# Patient Record
Sex: Female | Born: 1960 | Race: White | Hispanic: No | Smoking: Never smoker
Health system: Southern US, Community
[De-identification: ages and names within clinical notes are randomized; demographics above are authoritative.]

## PROBLEM LIST (undated history)

## (undated) DIAGNOSIS — R351 Nocturia: Secondary | ICD-10-CM

## (undated) DIAGNOSIS — R112 Nausea with vomiting, unspecified: Secondary | ICD-10-CM

## (undated) DIAGNOSIS — K219 Gastro-esophageal reflux disease without esophagitis: Secondary | ICD-10-CM

## (undated) DIAGNOSIS — K649 Unspecified hemorrhoids: Secondary | ICD-10-CM

## (undated) DIAGNOSIS — M8580 Other specified disorders of bone density and structure, unspecified site: Secondary | ICD-10-CM

## (undated) DIAGNOSIS — I10 Essential (primary) hypertension: Secondary | ICD-10-CM

## (undated) DIAGNOSIS — N809 Endometriosis, unspecified: Secondary | ICD-10-CM

## (undated) DIAGNOSIS — F419 Anxiety disorder, unspecified: Secondary | ICD-10-CM

## (undated) DIAGNOSIS — N393 Stress incontinence (female) (male): Secondary | ICD-10-CM

## (undated) DIAGNOSIS — Z9889 Other specified postprocedural states: Secondary | ICD-10-CM

## (undated) DIAGNOSIS — F329 Major depressive disorder, single episode, unspecified: Secondary | ICD-10-CM

## (undated) DIAGNOSIS — F32A Depression, unspecified: Secondary | ICD-10-CM

## (undated) HISTORY — DX: Nocturia: R35.1

## (undated) HISTORY — PX: FOOT SURGERY: SHX648

## (undated) HISTORY — DX: Endometriosis, unspecified: N80.9

## (undated) HISTORY — PX: DENTAL SURGERY: SHX609

## (undated) HISTORY — DX: Essential (primary) hypertension: I10

## (undated) HISTORY — DX: Unspecified hemorrhoids: K64.9

## (undated) HISTORY — DX: Gastro-esophageal reflux disease without esophagitis: K21.9

## (undated) HISTORY — PX: OTHER SURGICAL HISTORY: SHX169

## (undated) HISTORY — DX: Depression, unspecified: F32.A

## (undated) HISTORY — DX: Other specified disorders of bone density and structure, unspecified site: M85.80

## (undated) HISTORY — DX: Stress incontinence (female) (male): N39.3

## (undated) HISTORY — PX: OOPHORECTOMY: SHX86

## (undated) HISTORY — DX: Anxiety disorder, unspecified: F41.9

## (undated) HISTORY — DX: Major depressive disorder, single episode, unspecified: F32.9

---

## 1987-07-26 HISTORY — PX: TUBAL LIGATION: SHX77

## 1991-07-26 HISTORY — PX: TOTAL ABDOMINAL HYSTERECTOMY: SHX209

## 1996-07-25 HISTORY — PX: OTHER SURGICAL HISTORY: SHX169

## 1997-04-10 HISTORY — PX: LAPAROSCOPY: SHX197

## 2004-05-25 ENCOUNTER — Ambulatory Visit: Payer: Self-pay | Admitting: Obstetrics & Gynecology

## 2005-10-12 ENCOUNTER — Ambulatory Visit: Payer: Self-pay

## 2009-06-10 ENCOUNTER — Emergency Department: Payer: Self-pay | Admitting: Emergency Medicine

## 2009-10-23 DIAGNOSIS — M8580 Other specified disorders of bone density and structure, unspecified site: Secondary | ICD-10-CM

## 2009-10-23 HISTORY — DX: Other specified disorders of bone density and structure, unspecified site: M85.80

## 2009-10-27 ENCOUNTER — Emergency Department: Payer: Self-pay | Admitting: Emergency Medicine

## 2009-11-04 ENCOUNTER — Ambulatory Visit: Payer: Self-pay

## 2010-07-13 ENCOUNTER — Inpatient Hospital Stay: Payer: Self-pay | Admitting: Psychiatry

## 2010-07-25 HISTORY — PX: COLONOSCOPY: SHX5424

## 2010-08-13 ENCOUNTER — Inpatient Hospital Stay: Payer: Self-pay | Admitting: Psychiatry

## 2010-09-02 ENCOUNTER — Emergency Department: Payer: Self-pay | Admitting: Emergency Medicine

## 2010-09-03 ENCOUNTER — Ambulatory Visit: Payer: Self-pay | Admitting: Orthopedic Surgery

## 2010-12-28 ENCOUNTER — Inpatient Hospital Stay: Payer: Self-pay | Admitting: Psychiatry

## 2011-08-01 ENCOUNTER — Ambulatory Visit: Payer: Self-pay | Admitting: Family Medicine

## 2011-10-05 ENCOUNTER — Ambulatory Visit: Payer: Self-pay

## 2011-11-23 HISTORY — PX: CYSTOSCOPY: SUR368

## 2012-02-17 ENCOUNTER — Encounter: Payer: Self-pay | Admitting: Urology

## 2012-03-14 ENCOUNTER — Inpatient Hospital Stay: Payer: Self-pay | Admitting: Psychiatry

## 2012-03-15 LAB — URINALYSIS, COMPLETE
Bacteria: NONE SEEN
Glucose,UR: NEGATIVE mg/dL (ref 0–75)
Nitrite: NEGATIVE
Protein: NEGATIVE
RBC,UR: <1 /HPF (ref 0–5)
WBC UR: 1 /HPF (ref 0–5)

## 2013-01-03 DIAGNOSIS — R635 Abnormal weight gain: Secondary | ICD-10-CM | POA: Diagnosis not present

## 2013-01-03 DIAGNOSIS — E669 Obesity, unspecified: Secondary | ICD-10-CM | POA: Diagnosis not present

## 2013-01-04 DIAGNOSIS — I10 Essential (primary) hypertension: Secondary | ICD-10-CM | POA: Diagnosis not present

## 2013-01-04 DIAGNOSIS — R635 Abnormal weight gain: Secondary | ICD-10-CM | POA: Diagnosis not present

## 2013-03-14 DIAGNOSIS — H40009 Preglaucoma, unspecified, unspecified eye: Secondary | ICD-10-CM | POA: Diagnosis not present

## 2013-03-14 DIAGNOSIS — H524 Presbyopia: Secondary | ICD-10-CM | POA: Diagnosis not present

## 2013-03-14 DIAGNOSIS — H5200 Hypermetropia, unspecified eye: Secondary | ICD-10-CM | POA: Diagnosis not present

## 2013-03-14 DIAGNOSIS — H52229 Regular astigmatism, unspecified eye: Secondary | ICD-10-CM | POA: Diagnosis not present

## 2013-04-10 DIAGNOSIS — E669 Obesity, unspecified: Secondary | ICD-10-CM | POA: Diagnosis not present

## 2013-04-10 DIAGNOSIS — Z23 Encounter for immunization: Secondary | ICD-10-CM | POA: Diagnosis not present

## 2013-04-10 DIAGNOSIS — I10 Essential (primary) hypertension: Secondary | ICD-10-CM | POA: Diagnosis not present

## 2013-05-18 ENCOUNTER — Emergency Department: Payer: Self-pay | Admitting: Emergency Medicine

## 2013-05-18 DIAGNOSIS — S066XAA Traumatic subarachnoid hemorrhage with loss of consciousness status unknown, initial encounter: Secondary | ICD-10-CM | POA: Diagnosis not present

## 2013-05-18 DIAGNOSIS — S3600XA Unspecified injury of spleen, initial encounter: Secondary | ICD-10-CM | POA: Diagnosis not present

## 2013-05-18 DIAGNOSIS — S32009A Unspecified fracture of unspecified lumbar vertebra, initial encounter for closed fracture: Secondary | ICD-10-CM | POA: Diagnosis not present

## 2013-05-18 DIAGNOSIS — S060X9A Concussion with loss of consciousness of unspecified duration, initial encounter: Secondary | ICD-10-CM | POA: Diagnosis not present

## 2013-05-18 DIAGNOSIS — R918 Other nonspecific abnormal finding of lung field: Secondary | ICD-10-CM | POA: Diagnosis not present

## 2013-05-18 DIAGNOSIS — S06309A Unspecified focal traumatic brain injury with loss of consciousness of unspecified duration, initial encounter: Secondary | ICD-10-CM | POA: Diagnosis not present

## 2013-05-18 DIAGNOSIS — S0230XA Fracture of orbital floor, unspecified side, initial encounter for closed fracture: Secondary | ICD-10-CM | POA: Diagnosis not present

## 2013-05-18 DIAGNOSIS — I10 Essential (primary) hypertension: Secondary | ICD-10-CM | POA: Diagnosis not present

## 2013-05-18 LAB — CBC
HCT: 39.1 % (ref 35.0–47.0)
HGB: 13.7 g/dL (ref 12.0–16.0)
MCV: 96 fL (ref 80–100)
Platelet: 266 10*3/uL (ref 150–440)
RBC: 4.06 10*6/uL (ref 3.80–5.20)
RDW: 12.6 % (ref 11.5–14.5)
WBC: 12.8 10*3/uL — ABNORMAL HIGH (ref 3.6–11.0)

## 2013-05-18 LAB — PROTIME-INR
INR: 1
Prothrombin Time: 13.6 secs (ref 11.5–14.7)

## 2013-05-18 LAB — LIPASE, BLOOD: Lipase: 128 U/L (ref 73–393)

## 2013-05-18 LAB — URINALYSIS, COMPLETE
Bilirubin,UR: NEGATIVE
Blood: NEGATIVE
Glucose,UR: NEGATIVE mg/dL (ref 0–75)
Ketone: NEGATIVE
Leukocyte Esterase: NEGATIVE
Ph: 5 (ref 4.5–8.0)
Protein: NEGATIVE
Specific Gravity: 1.004 (ref 1.003–1.030)
WBC UR: 1 /HPF (ref 0–5)

## 2013-05-18 LAB — COMPREHENSIVE METABOLIC PANEL
Albumin: 3.7 g/dL (ref 3.4–5.0)
BUN: 8 mg/dL (ref 7–18)
Bilirubin,Total: 0.2 mg/dL (ref 0.2–1.0)
Chloride: 97 mmol/L — ABNORMAL LOW (ref 98–107)
Creatinine: 0.96 mg/dL (ref 0.60–1.30)
EGFR (African American): >60
EGFR (Non-African Amer.): >60
Glucose: 101 mg/dL — ABNORMAL HIGH (ref 65–99)
SGPT (ALT): 23 U/L (ref 12–78)
Sodium: 131 mmol/L — ABNORMAL LOW (ref 136–145)

## 2013-05-18 LAB — APTT: Activated PTT: 27.6 secs (ref 23.6–35.9)

## 2013-05-18 LAB — DRUG SCREEN, URINE
Benzodiazepine, Ur Scrn: NEGATIVE (ref ?–200)
Cannabinoid 50 Ng, Ur ~~LOC~~: NEGATIVE (ref ?–50)
Cocaine Metabolite,Ur ~~LOC~~: NEGATIVE (ref ?–300)
MDMA (Ecstasy)Ur Screen: NEGATIVE (ref ?–500)
Methadone, Ur Screen: NEGATIVE (ref ?–300)
Opiate, Ur Screen: NEGATIVE (ref ?–300)
Phencyclidine (PCP) Ur S: NEGATIVE (ref ?–25)
Tricyclic, Ur Screen: NEGATIVE (ref ?–1000)

## 2013-05-18 LAB — TROPONIN I: Troponin-I: <0.02 ng/mL

## 2013-05-18 LAB — CALCIUM: Calcium, Total: 8.6 mg/dL (ref 8.5–10.1)

## 2013-05-19 DIAGNOSIS — R918 Other nonspecific abnormal finding of lung field: Secondary | ICD-10-CM | POA: Diagnosis not present

## 2013-05-19 DIAGNOSIS — S3600XA Unspecified injury of spleen, initial encounter: Secondary | ICD-10-CM | POA: Diagnosis not present

## 2013-05-19 DIAGNOSIS — Z043 Encounter for examination and observation following other accident: Secondary | ICD-10-CM | POA: Diagnosis not present

## 2013-05-19 DIAGNOSIS — S36039A Unspecified laceration of spleen, initial encounter: Secondary | ICD-10-CM | POA: Diagnosis not present

## 2013-05-19 DIAGNOSIS — S2249XA Multiple fractures of ribs, unspecified side, initial encounter for closed fracture: Secondary | ICD-10-CM | POA: Diagnosis present

## 2013-05-19 DIAGNOSIS — S22009A Unspecified fracture of unspecified thoracic vertebra, initial encounter for closed fracture: Secondary | ICD-10-CM | POA: Diagnosis not present

## 2013-05-19 DIAGNOSIS — S066X9A Traumatic subarachnoid hemorrhage with loss of consciousness of unspecified duration, initial encounter: Secondary | ICD-10-CM | POA: Diagnosis not present

## 2013-05-19 DIAGNOSIS — S0280XA Fracture of other specified skull and facial bones, unspecified side, initial encounter for closed fracture: Secondary | ICD-10-CM | POA: Diagnosis not present

## 2013-05-19 DIAGNOSIS — S3609XA Other injury of spleen, initial encounter: Secondary | ICD-10-CM | POA: Diagnosis not present

## 2013-05-19 DIAGNOSIS — S06330A Contusion and laceration of cerebrum, unspecified, without loss of consciousness, initial encounter: Secondary | ICD-10-CM | POA: Diagnosis not present

## 2013-05-19 DIAGNOSIS — S32009A Unspecified fracture of unspecified lumbar vertebra, initial encounter for closed fracture: Secondary | ICD-10-CM | POA: Diagnosis not present

## 2013-05-19 DIAGNOSIS — S0230XA Fracture of orbital floor, unspecified side, initial encounter for closed fracture: Secondary | ICD-10-CM | POA: Diagnosis not present

## 2013-05-19 DIAGNOSIS — R3589 Other polyuria: Secondary | ICD-10-CM | POA: Diagnosis not present

## 2013-05-31 DIAGNOSIS — S0292XS Unspecified fracture of facial bones, sequela: Secondary | ICD-10-CM | POA: Diagnosis not present

## 2013-05-31 DIAGNOSIS — S0291XS Unspecified fracture of skull, sequela: Secondary | ICD-10-CM | POA: Diagnosis not present

## 2013-06-14 DIAGNOSIS — S32009A Unspecified fracture of unspecified lumbar vertebra, initial encounter for closed fracture: Secondary | ICD-10-CM | POA: Insufficient documentation

## 2013-06-14 DIAGNOSIS — IMO0002 Reserved for concepts with insufficient information to code with codable children: Secondary | ICD-10-CM | POA: Diagnosis not present

## 2013-06-14 DIAGNOSIS — M549 Dorsalgia, unspecified: Secondary | ICD-10-CM | POA: Diagnosis not present

## 2013-07-08 DIAGNOSIS — I10 Essential (primary) hypertension: Secondary | ICD-10-CM | POA: Diagnosis not present

## 2013-07-08 DIAGNOSIS — M25529 Pain in unspecified elbow: Secondary | ICD-10-CM | POA: Diagnosis not present

## 2013-07-12 DIAGNOSIS — IMO0002 Reserved for concepts with insufficient information to code with codable children: Secondary | ICD-10-CM | POA: Diagnosis not present

## 2013-07-12 DIAGNOSIS — Q762 Congenital spondylolisthesis: Secondary | ICD-10-CM | POA: Diagnosis not present

## 2013-07-12 DIAGNOSIS — M5137 Other intervertebral disc degeneration, lumbosacral region: Secondary | ICD-10-CM | POA: Diagnosis not present

## 2013-07-12 DIAGNOSIS — M549 Dorsalgia, unspecified: Secondary | ICD-10-CM | POA: Diagnosis not present

## 2013-08-19 DIAGNOSIS — M549 Dorsalgia, unspecified: Secondary | ICD-10-CM | POA: Diagnosis not present

## 2013-08-19 DIAGNOSIS — S32009A Unspecified fracture of unspecified lumbar vertebra, initial encounter for closed fracture: Secondary | ICD-10-CM | POA: Diagnosis not present

## 2013-08-22 DIAGNOSIS — S32009A Unspecified fracture of unspecified lumbar vertebra, initial encounter for closed fracture: Secondary | ICD-10-CM | POA: Diagnosis not present

## 2013-08-22 DIAGNOSIS — M549 Dorsalgia, unspecified: Secondary | ICD-10-CM | POA: Diagnosis not present

## 2013-08-25 DIAGNOSIS — M549 Dorsalgia, unspecified: Secondary | ICD-10-CM | POA: Diagnosis not present

## 2013-08-25 DIAGNOSIS — S32009A Unspecified fracture of unspecified lumbar vertebra, initial encounter for closed fracture: Secondary | ICD-10-CM | POA: Diagnosis not present

## 2013-10-16 DIAGNOSIS — F411 Generalized anxiety disorder: Secondary | ICD-10-CM | POA: Diagnosis not present

## 2013-10-16 DIAGNOSIS — F314 Bipolar disorder, current episode depressed, severe, without psychotic features: Secondary | ICD-10-CM | POA: Diagnosis not present

## 2014-05-26 DIAGNOSIS — J019 Acute sinusitis, unspecified: Secondary | ICD-10-CM | POA: Diagnosis not present

## 2014-05-26 DIAGNOSIS — I10 Essential (primary) hypertension: Secondary | ICD-10-CM | POA: Diagnosis not present

## 2014-05-26 DIAGNOSIS — Z23 Encounter for immunization: Secondary | ICD-10-CM | POA: Diagnosis not present

## 2014-05-28 DIAGNOSIS — F410 Panic disorder [episodic paroxysmal anxiety] without agoraphobia: Secondary | ICD-10-CM | POA: Diagnosis not present

## 2014-05-28 DIAGNOSIS — F411 Generalized anxiety disorder: Secondary | ICD-10-CM | POA: Diagnosis not present

## 2014-05-28 DIAGNOSIS — F332 Major depressive disorder, recurrent severe without psychotic features: Secondary | ICD-10-CM | POA: Diagnosis not present

## 2014-05-28 DIAGNOSIS — F42 Obsessive-compulsive disorder: Secondary | ICD-10-CM | POA: Diagnosis not present
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
## 2014-10-29 ENCOUNTER — Emergency Department
Admit: 2014-10-29 | Disposition: A | Discharge status: Home / Self Care | Payer: Self-pay | Admitting: Emergency Medicine

## 2014-10-29 DIAGNOSIS — R1110 Vomiting, unspecified: Secondary | ICD-10-CM | POA: Diagnosis not present

## 2014-10-29 DIAGNOSIS — R112 Nausea with vomiting, unspecified: Secondary | ICD-10-CM | POA: Diagnosis not present

## 2014-10-29 DIAGNOSIS — Z79899 Other long term (current) drug therapy: Secondary | ICD-10-CM | POA: Diagnosis not present

## 2014-10-29 DIAGNOSIS — R109 Unspecified abdominal pain: Secondary | ICD-10-CM | POA: Diagnosis not present

## 2014-10-29 DIAGNOSIS — R197 Diarrhea, unspecified: Secondary | ICD-10-CM | POA: Diagnosis not present

## 2014-10-29 LAB — COMPREHENSIVE METABOLIC PANEL
ALBUMIN: 3.8 g/dL
ALT: 14 U/L
AST: 19 U/L
Alkaline Phosphatase: 62 U/L
Anion Gap: 14 (ref 7–16)
BILIRUBIN TOTAL: 0.9 mg/dL
BUN: 17 mg/dL
CREATININE: 1.45 mg/dL — AB
Calcium, Total: 9.1 mg/dL
Chloride: 92 mmol/L — ABNORMAL LOW
Co2: 29 mmol/L
EGFR (African American): 47 — ABNORMAL LOW
EGFR (Non-African Amer.): 41 — ABNORMAL LOW
GLUCOSE: 129 mg/dL — AB
POTASSIUM: 2.7 mmol/L — AB
SODIUM: 135 mmol/L
TOTAL PROTEIN: 7.7 g/dL

## 2014-10-29 LAB — CBC WITH DIFFERENTIAL/PLATELET
Basophil #: 0 10*3/uL (ref 0.0–0.1)
Basophil %: 0.3 %
EOS PCT: 0.1 %
Eosinophil #: 0 10*3/uL (ref 0.0–0.7)
HCT: 39.7 % (ref 35.0–47.0)
HGB: 13.5 g/dL (ref 12.0–16.0)
Lymphocyte #: 0.8 10*3/uL — ABNORMAL LOW (ref 1.0–3.6)
Lymphocyte %: 7.4 %
MCH: 34.9 pg — ABNORMAL HIGH (ref 26.0–34.0)
MCHC: 33.9 g/dL (ref 32.0–36.0)
MCV: 103 fL — AB (ref 80–100)
MONOS PCT: 6.8 %
Monocyte #: 0.7 x10 3/mm (ref 0.2–0.9)
NEUTROS PCT: 85.4 %
Neutrophil #: 9 10*3/uL — ABNORMAL HIGH (ref 1.4–6.5)
PLATELETS: 187 10*3/uL (ref 150–440)
RBC: 3.87 10*6/uL (ref 3.80–5.20)
RDW: 12.1 % (ref 11.5–14.5)
WBC: 10.6 10*3/uL (ref 3.6–11.0)

## 2014-10-29 LAB — LIPASE, BLOOD: LIPASE: 49 U/L
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
## 2014-12-10 DIAGNOSIS — J188 Other pneumonia, unspecified organism: Secondary | ICD-10-CM | POA: Diagnosis not present

## 2015-01-06 ENCOUNTER — Other Ambulatory Visit: Payer: Self-pay | Admitting: Family Medicine

## 2015-01-06 DIAGNOSIS — I10 Essential (primary) hypertension: Secondary | ICD-10-CM

## 2015-01-21 ENCOUNTER — Ambulatory Visit (INDEPENDENT_AMBULATORY_CARE_PROVIDER_SITE_OTHER): Payer: Medicare Other | Admitting: Family Medicine

## 2015-01-21 ENCOUNTER — Encounter: Payer: Self-pay | Admitting: Family Medicine

## 2015-01-21 VITALS — BP 130/80 | HR 80 | Ht 61.0 in | Wt 165.0 lb

## 2015-01-21 DIAGNOSIS — F32A Depression, unspecified: Secondary | ICD-10-CM | POA: Insufficient documentation

## 2015-01-21 DIAGNOSIS — M1711 Unilateral primary osteoarthritis, right knee: Secondary | ICD-10-CM

## 2015-01-21 DIAGNOSIS — I10 Essential (primary) hypertension: Secondary | ICD-10-CM | POA: Diagnosis not present

## 2015-01-21 DIAGNOSIS — F418 Other specified anxiety disorders: Secondary | ICD-10-CM

## 2015-01-21 DIAGNOSIS — F419 Anxiety disorder, unspecified: Secondary | ICD-10-CM | POA: Insufficient documentation

## 2015-01-21 DIAGNOSIS — F329 Major depressive disorder, single episode, unspecified: Secondary | ICD-10-CM

## 2015-01-21 MED ORDER — LOSARTAN POTASSIUM-HCTZ 100-25 MG PO TABS: 1.0000 | ORAL_TABLET | Freq: Every day | ORAL | Status: DC

## 2015-01-21 MED ORDER — VORTIOXETINE HBR 10 MG PO TABS: 1.0000 | ORAL_TABLET | Freq: Every day | ORAL | Status: DC

## 2015-01-21 NOTE — Progress Notes (Signed)
Name: Barbara Durham   MRN: [id number]    DOB: 07/12/1961   Date:01/21/2015       Progress Note  Subjective  Chief Complaint  Chief Complaint  Patient presents with  . Hypertension  . Knee Pain    Hypertension This is a recurrent problem. The current episode started more than 1 year ago. The problem has been gradually improving since onset. The problem is controlled. Associated symptoms include anxiety. Pertinent negatives include no blurred vision, chest pain, headaches, malaise/fatigue, neck pain, orthopnea, palpitations, peripheral edema, PND, shortness of breath or sweats. There are no associated agents to hypertension. Risk factors for coronary artery disease include stress. Past treatments include angiotensin blockers and diuretics. The current treatment provides moderate improvement. There are no compliance problems.  There is no history of angina, kidney disease, CAD/MI, CVA, heart failure, left ventricular hypertrophy, PVD or retinopathy. There is no history of chronic renal disease.  Knee Pain  There was no injury mechanism. The pain is present in the right knee. The pain is at a severity of 7/10. The pain is moderate. The pain has been worsening since onset. Associated symptoms include a loss of motion. Pertinent negatives include no inability to bear weight or tingling. She has tried NSAIDs for the symptoms. The treatment provided no relief.  Mental Health Problem The primary symptoms include dysphoric mood. The primary symptoms do not include delusions, hallucinations, bizarre behavior, disorganized speech, negative symptoms or somatic symptoms. This is a chronic problem.  The degree of incapacity that she is experiencing as a consequence of her illness is moderate. Additional symptoms of the illness include insomnia. Additional symptoms of the illness do not include no anhedonia, no euphoric mood, no headaches or no abdominal pain. She does not admit to suicidal ideas. She does  not have a plan to commit suicide. She does not contemplate harming herself. She does not contemplate injuring another person.  Anxiety Presents for follow-up visit. The problem has been waxing and waning. Symptoms include insomnia and nervous/anxious behavior. Patient reports no chest pain, dizziness, nausea, palpitations, shortness of breath or suicidal ideas. Symptoms occur most days. The severity of symptoms is moderate.   Past treatments include benzodiazephines and non-SSRI antidepressants.    No problem-specific assessment & plan notes found for this encounter.   Past Medical History  Diagnosis Date  . Depression   . Anxiety   . Hypertension     Past Surgical History  Procedure Laterality Date  . Vaginal hysterectomy    . Arm fracture Left     Family History  Problem Relation Age of Onset  . Heart disease Mother     History   Social History  . Marital Status: Married    Spouse Name: N/A  . Number of Children: N/A  . Years of Education: N/A   Occupational History  . Not on file.   Social History Main Topics  . Smoking status: Never Smoker   . Smokeless tobacco: Not on file  . Alcohol Use: 0.0 oz/week    0 Standard drinks or equivalent per week  . Drug Use: No  . Sexual Activity: Yes   Other Topics Concern  . Not on file   Social History Narrative  . No narrative on file    No Known Allergies   Review of Systems  Constitutional: Negative for fever, chills, weight loss and malaise/fatigue.  HENT: Negative for ear discharge, ear pain and sore throat.   Eyes: Negative for blurred vision.  Respiratory: Negative for cough, sputum production, shortness of breath and wheezing.   Cardiovascular: Negative for chest pain, palpitations, orthopnea, leg swelling and PND.  Gastrointestinal: Negative for heartburn, nausea, abdominal pain, diarrhea, constipation, blood in stool and melena.  Genitourinary: Negative for dysuria, urgency, frequency and hematuria.   Musculoskeletal: Negative for myalgias, back pain, joint pain and neck pain.  Skin: Negative for rash.  Neurological: Negative for dizziness, tingling, sensory change, focal weakness and headaches.  Endo/Heme/Allergies: Negative for environmental allergies and polydipsia. Does not bruise/bleed easily.  Psychiatric/Behavioral: Positive for dysphoric mood. Negative for depression, suicidal ideas and hallucinations. The patient is nervous/anxious and has insomnia.      Objective  Filed Vitals:   01/21/15 0916  BP: 130/80  Pulse: 80  Height: 5\' 1"  (1.549 m)  Weight: 165 lb (74.844 kg)    Physical Exam  Constitutional: She is well-developed, well-nourished, and in no distress. No distress.  HENT:  Head: Normocephalic and atraumatic.  Right Ear: External ear normal.  Left Ear: External ear normal.  Nose: Nose normal.  Mouth/Throat: Oropharynx is clear and moist.  Eyes: Conjunctivae and EOM are normal. Pupils are equal, round, and reactive to light. Right eye exhibits no discharge. Left eye exhibits no discharge.  Neck: Normal range of motion. Neck supple. No JVD present. No thyromegaly present.  Cardiovascular: Normal rate, regular rhythm, normal heart sounds and intact distal pulses.  Exam reveals no gallop and no friction rub.   No murmur heard. Pulmonary/Chest: Effort normal and breath sounds normal.  Abdominal: Soft. Bowel sounds are normal. She exhibits no mass. There is no tenderness. There is no guarding.  Musculoskeletal: Normal range of motion. She exhibits no edema.  Lymphadenopathy:    She has no cervical adenopathy.  Neurological: She is alert. She has normal reflexes.  Skin: Skin is warm and dry. No rash noted. She is not diaphoretic. No erythema. No pallor.  Psychiatric: Mood and affect normal.      Assessment & Plan  Problem List Items Addressed This Visit      Cardiovascular and Mediastinum   Hypertension - Primary   Relevant Medications    losartan-hydrochlorothiazide (HYZAAR) 100-25 MG per tablet     Other   Anxiety and depression   Relevant Medications   Vortioxetine HBr (BRINTELLIX) 10 MG TABS    Other Visit Diagnoses    Primary osteoarthritis of right knee        Relevant Orders    Ambulatory referral to Orthopedic Surgery         Dr. Otilio Miu [facility]  01/21/2015

## 2015-02-05 DIAGNOSIS — M25561 Pain in right knee: Secondary | ICD-10-CM | POA: Diagnosis not present

## 2015-02-05 DIAGNOSIS — M2391 Unspecified internal derangement of right knee: Secondary | ICD-10-CM | POA: Diagnosis not present

## 2015-02-17 ENCOUNTER — Ambulatory Visit: Payer: Medicare Other | Admitting: Psychiatry

## 2015-02-19 ENCOUNTER — Ambulatory Visit (INDEPENDENT_AMBULATORY_CARE_PROVIDER_SITE_OTHER): Payer: Medicare Other | Admitting: Psychiatry

## 2015-02-19 ENCOUNTER — Encounter: Payer: Self-pay | Admitting: Psychiatry

## 2015-02-19 VITALS — BP 132/88 | HR 70 | Temp 97.1°F | Ht 61.0 in | Wt 178.8 lb

## 2015-02-19 DIAGNOSIS — F419 Anxiety disorder, unspecified: Principal | ICD-10-CM

## 2015-02-19 DIAGNOSIS — F329 Major depressive disorder, single episode, unspecified: Secondary | ICD-10-CM

## 2015-02-19 DIAGNOSIS — F32A Depression, unspecified: Secondary | ICD-10-CM

## 2015-02-19 DIAGNOSIS — F418 Other specified anxiety disorders: Secondary | ICD-10-CM

## 2015-02-19 MED ORDER — VORTIOXETINE HBR 5 MG PO TABS: 5.0000 mg | ORAL_TABLET | Freq: Every morning | ORAL | Status: DC

## 2015-02-19 MED ORDER — VORTIOXETINE HBR 10 MG PO TABS: 1.0000 | ORAL_TABLET | Freq: Every day | ORAL | Status: DC

## 2015-02-19 MED ORDER — CLONAZEPAM 0.5 MG PO TABS: 1.0000 mg | ORAL_TABLET | Freq: Every day | ORAL | Status: DC

## 2015-02-19 MED ORDER — QUETIAPINE FUMARATE 100 MG PO TABS: 100.0000 mg | ORAL_TABLET | Freq: Every day | ORAL | Status: DC
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
## 2015-03-17 ENCOUNTER — Ambulatory Visit: Payer: Self-pay | Admitting: Psychiatry

## 2015-03-26 ENCOUNTER — Ambulatory Visit: Payer: Self-pay | Admitting: Psychiatry

## 2015-04-16 ENCOUNTER — Encounter: Payer: Self-pay | Admitting: Psychiatry

## 2015-04-16 ENCOUNTER — Ambulatory Visit (INDEPENDENT_AMBULATORY_CARE_PROVIDER_SITE_OTHER): Payer: Medicare Other | Admitting: Psychiatry

## 2015-04-16 VITALS — BP 128/84 | HR 74 | Temp 97.7°F | Ht 62.0 in | Wt 180.8 lb

## 2015-04-16 DIAGNOSIS — F418 Other specified anxiety disorders: Secondary | ICD-10-CM

## 2015-04-16 DIAGNOSIS — F419 Anxiety disorder, unspecified: Secondary | ICD-10-CM

## 2015-04-16 DIAGNOSIS — F331 Major depressive disorder, recurrent, moderate: Secondary | ICD-10-CM

## 2015-04-16 DIAGNOSIS — F32A Depression, unspecified: Secondary | ICD-10-CM

## 2015-04-16 DIAGNOSIS — F329 Major depressive disorder, single episode, unspecified: Secondary | ICD-10-CM

## 2015-04-16 MED ORDER — HYDROXYZINE PAMOATE 50 MG PO CAPS: 50.0000 mg | ORAL_CAPSULE | Freq: Every day | ORAL | Status: DC

## 2015-04-16 MED ORDER — VORTIOXETINE HBR 5 MG PO TABS: 5.0000 mg | ORAL_TABLET | Freq: Every morning | ORAL | Status: DC

## 2015-04-16 MED ORDER — VORTIOXETINE HBR 10 MG PO TABS: 1.0000 | ORAL_TABLET | Freq: Every day | ORAL | Status: DC

## 2015-04-16 MED ORDER — CLONAZEPAM 0.5 MG PO TABS: 0.5000 mg | ORAL_TABLET | Freq: Every day | ORAL | Status: DC
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
## 2015-04-20 ENCOUNTER — Telehealth: Payer: Self-pay

## 2015-04-20 NOTE — Telephone Encounter (Signed)
lea states that patient left a message on her voicemail that her sleep meds are not working.

## 2015-04-20 NOTE — Telephone Encounter (Signed)
i called and left a message for patient to call office back for more details.

## 2015-04-21 MED ORDER — TRAZODONE HCL 50 MG PO TABS: ORAL_TABLET | ORAL | Status: DC

## 2015-04-21 NOTE — Telephone Encounter (Signed)
Patient was seen by Dr. Gretel Acre. I'm currently covering for Dr. Gretel Acre. Spoke with patient today. She stated that she's been taking the hydroxyzine and it has been ineffective. She states she's taking 50 mg and then waited and taken another 50 mg. I asked her if she was either drowsy on this and discussed that we could potentially increase her dose of hydroxyzine. However patient reports no effect whatsoever with taking the one tablet in then taking another a few hours later. She is more interested in another medication. Risk and benefits of trazodone at been discussed and patient is able to consent. I will start trazodone 50 mg to 100 mg at bedtime as needed for insomnia. I've instructed her to stop the hydroxyzine .She has been encouraged calling questions or concerns. She will follow with Dr. Gretel Acre.

## 2015-04-21 NOTE — Telephone Encounter (Signed)
pt called back pt states that the hydroxyzine is not working she usually take around 8 and then she said that she has even taken another on 2-3 hours after that to see if that helps. pt  states that she had tried seroquel in the past but it made her legs move too much.  Pt states that she also was on ambien but she was taking 2  10mg  tablets but dr. Gretel Acre took her off it.  Pt states she really needs to try and get some sleep.

## 2015-04-28 NOTE — Telephone Encounter (Signed)
Pt started on Trazodone by Dr Jimmye Norman who was covering for me.

## 2015-05-14 ENCOUNTER — Ambulatory Visit (INDEPENDENT_AMBULATORY_CARE_PROVIDER_SITE_OTHER): Payer: Medicare Other | Admitting: Psychiatry

## 2015-05-14 ENCOUNTER — Encounter: Payer: Self-pay | Admitting: Psychiatry

## 2015-05-14 VITALS — BP 140/90 | HR 115 | Temp 97.3°F | Ht 62.0 in | Wt 181.8 lb

## 2015-05-14 DIAGNOSIS — F329 Major depressive disorder, single episode, unspecified: Secondary | ICD-10-CM

## 2015-05-14 DIAGNOSIS — F331 Major depressive disorder, recurrent, moderate: Secondary | ICD-10-CM | POA: Diagnosis not present

## 2015-05-14 DIAGNOSIS — F419 Anxiety disorder, unspecified: Secondary | ICD-10-CM

## 2015-05-14 DIAGNOSIS — F418 Other specified anxiety disorders: Secondary | ICD-10-CM | POA: Diagnosis not present

## 2015-05-14 DIAGNOSIS — F32A Depression, unspecified: Secondary | ICD-10-CM

## 2015-05-14 MED ORDER — VORTIOXETINE HBR 10 MG PO TABS: 1.0000 | ORAL_TABLET | Freq: Every day | ORAL | Status: DC

## 2015-05-14 MED ORDER — AMITRIPTYLINE HCL 50 MG PO TABS: 25.0000 mg | ORAL_TABLET | Freq: Every day | ORAL | Status: DC

## 2015-05-14 MED ORDER — VORTIOXETINE HBR 5 MG PO TABS: 5.0000 mg | ORAL_TABLET | Freq: Every morning | ORAL | Status: DC

## 2015-05-14 MED ORDER — CLONAZEPAM 0.5 MG PO TABS: 0.5000 mg | ORAL_TABLET | Freq: Every day | ORAL | Status: DC
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
## 2015-06-22 NOTE — Progress Notes (Signed)
According to the notes on  05-14-15 pt states that the medications did not work.

## 2015-06-23 ENCOUNTER — Ambulatory Visit (INDEPENDENT_AMBULATORY_CARE_PROVIDER_SITE_OTHER): Payer: Medicare Other | Admitting: Psychiatry

## 2015-06-23 ENCOUNTER — Encounter: Payer: Self-pay | Admitting: Psychiatry

## 2015-06-23 VITALS — BP 138/85 | HR 113 | Temp 97.7°F | Ht 62.0 in | Wt 190.0 lb

## 2015-06-23 DIAGNOSIS — F331 Major depressive disorder, recurrent, moderate: Secondary | ICD-10-CM

## 2015-06-23 MED ORDER — ZOLPIDEM TARTRATE 10 MG PO TABS: 10.0000 mg | ORAL_TABLET | Freq: Every evening | ORAL | Status: DC | PRN

## 2015-06-23 MED ORDER — CLONAZEPAM 0.5 MG PO TABS: 0.5000 mg | ORAL_TABLET | Freq: Every day | ORAL | Status: DC
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
## 2015-06-24 NOTE — Progress Notes (Signed)
Called [facility] spoke with Barbara Durham discontine elavil pt states it not working

## 2015-07-13 ENCOUNTER — Telehealth: Payer: Self-pay

## 2015-07-13 NOTE — Telephone Encounter (Signed)
pt has been enrolled in the [employer] patient assistance program.  pt is eligible to received free medications throught the program until 07-25-15  all approved medication will need to be fax when ready for refill rx needed to be 90 day supply and faxed to [phone].

## 2015-08-20 ENCOUNTER — Ambulatory Visit (INDEPENDENT_AMBULATORY_CARE_PROVIDER_SITE_OTHER): Payer: Medicare Other | Admitting: Psychiatry

## 2015-08-20 ENCOUNTER — Encounter: Payer: Self-pay | Admitting: Psychiatry

## 2015-08-20 VITALS — BP 138/82 | HR 103 | Temp 97.1°F | Ht 62.0 in | Wt 187.6 lb

## 2015-08-20 DIAGNOSIS — F411 Generalized anxiety disorder: Secondary | ICD-10-CM | POA: Diagnosis not present

## 2015-08-20 DIAGNOSIS — F331 Major depressive disorder, recurrent, moderate: Secondary | ICD-10-CM

## 2015-08-20 MED ORDER — CLONAZEPAM 0.5 MG PO TABS: 0.5000 mg | ORAL_TABLET | Freq: Every day | ORAL | Status: DC

## 2015-08-20 MED ORDER — ZOLPIDEM TARTRATE 5 MG PO TABS: 5.0000 mg | ORAL_TABLET | Freq: Every day | ORAL | Status: DC
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
## 2015-09-15 ENCOUNTER — Telehealth: Payer: Self-pay | Admitting: Psychiatry

## 2015-09-17 NOTE — Telephone Encounter (Signed)
Pt will continue on same dose. We did not discuss increasing the dose to 10mg . She is also taking Klonopin at bedtime.

## 2015-09-21 DIAGNOSIS — H524 Presbyopia: Secondary | ICD-10-CM | POA: Diagnosis not present

## 2015-09-21 DIAGNOSIS — Z83511 Family history of glaucoma: Secondary | ICD-10-CM | POA: Diagnosis not present

## 2015-09-21 DIAGNOSIS — H40013 Open angle with borderline findings, low risk, bilateral: Secondary | ICD-10-CM | POA: Diagnosis not present

## 2015-09-21 DIAGNOSIS — H52223 Regular astigmatism, bilateral: Secondary | ICD-10-CM | POA: Diagnosis not present

## 2015-09-21 DIAGNOSIS — H5203 Hypermetropia, bilateral: Secondary | ICD-10-CM | POA: Diagnosis not present

## 2015-09-30 ENCOUNTER — Telehealth: Payer: Self-pay

## 2015-09-30 NOTE — Telephone Encounter (Signed)
pt called states that she was on ambien 10mg  but you cut down to 5mg . pt states she has tried for about a month and she still can not stay asleep. pt wants to go back on ambien 10mg .

## 2015-10-01 NOTE — Telephone Encounter (Signed)
Ask pt to make appointment for medication refill, discussion about ambien and klonopin.

## 2015-10-10 ENCOUNTER — Other Ambulatory Visit: Payer: Self-pay | Admitting: Family Medicine

## 2015-10-15 ENCOUNTER — Other Ambulatory Visit: Payer: Self-pay

## 2015-10-15 ENCOUNTER — Encounter: Payer: Self-pay | Admitting: Psychiatry

## 2015-10-15 ENCOUNTER — Ambulatory Visit (INDEPENDENT_AMBULATORY_CARE_PROVIDER_SITE_OTHER): Payer: Medicare Other | Admitting: Psychiatry

## 2015-10-15 VITALS — BP 124/78 | HR 93 | Temp 98.1°F | Ht 62.0 in | Wt 194.0 lb

## 2015-10-15 DIAGNOSIS — F5102 Adjustment insomnia: Secondary | ICD-10-CM | POA: Diagnosis not present

## 2015-10-15 DIAGNOSIS — F331 Major depressive disorder, recurrent, moderate: Secondary | ICD-10-CM

## 2015-10-15 MED ORDER — LOSARTAN POTASSIUM-HCTZ 100-25 MG PO TABS: 1.0000 | ORAL_TABLET | Freq: Every day | ORAL | Status: DC

## 2015-10-15 MED ORDER — ZOLPIDEM TARTRATE 10 MG PO TABS: 10.0000 mg | ORAL_TABLET | Freq: Every day | ORAL | Status: DC

## 2015-10-15 MED ORDER — CLONAZEPAM 0.5 MG PO TABS: 0.5000 mg | ORAL_TABLET | Freq: Every day | ORAL | Status: DC
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
## 2015-10-19 ENCOUNTER — Encounter: Payer: Self-pay | Admitting: Family Medicine

## 2015-10-19 ENCOUNTER — Ambulatory Visit
Admission: RE | Admit: 2015-10-19 | Discharge: 2015-10-19 | Disposition: A | Discharge status: Home / Self Care | Payer: Medicare Other | Source: Ambulatory Visit | Attending: Family Medicine | Admitting: Family Medicine

## 2015-10-19 ENCOUNTER — Ambulatory Visit (INDEPENDENT_AMBULATORY_CARE_PROVIDER_SITE_OTHER): Payer: Medicare Other | Admitting: Family Medicine

## 2015-10-19 VITALS — BP 120/92 | HR 80 | Ht 62.0 in | Wt 192.0 lb

## 2015-10-19 DIAGNOSIS — M25531 Pain in right wrist: Secondary | ICD-10-CM | POA: Diagnosis not present

## 2015-10-19 DIAGNOSIS — I10 Essential (primary) hypertension: Secondary | ICD-10-CM | POA: Diagnosis not present

## 2015-10-19 MED ORDER — MELOXICAM 15 MG PO TABS: 15.0000 mg | ORAL_TABLET | Freq: Every day | ORAL | Status: DC

## 2015-10-19 MED ORDER — LOSARTAN POTASSIUM-HCTZ 100-25 MG PO TABS
1.0000 | ORAL_TABLET | Freq: Every day | ORAL | Status: DC
Start: 2015-10-19 — End: 2016-07-04
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
## 2015-10-20 LAB — RENAL FUNCTION PANEL
Albumin: 4.4 g/dL (ref 3.5–5.5)
BUN / CREAT RATIO: 25 — AB (ref 9–23)
BUN: 27 mg/dL — AB (ref 6–24)
CO2: 28 mmol/L (ref 18–29)
CREATININE: 1.08 mg/dL — AB (ref 0.57–1.00)
Calcium: 9.5 mg/dL (ref 8.7–10.2)
Chloride: 97 mmol/L (ref 96–106)
GFR, EST AFRICAN AMERICAN: 67 mL/min/{1.73_m2} (ref >59)
GFR, EST NON AFRICAN AMERICAN: 58 mL/min/{1.73_m2} — AB (ref >59)
Glucose: 88 mg/dL (ref 65–99)
Phosphorus: 3.5 mg/dL (ref 2.5–4.5)
Potassium: 4 mmol/L (ref 3.5–5.2)
SODIUM: 140 mmol/L (ref 134–144)

## 2015-12-15 ENCOUNTER — Ambulatory Visit (INDEPENDENT_AMBULATORY_CARE_PROVIDER_SITE_OTHER): Payer: Medicare Other | Admitting: Psychiatry

## 2015-12-15 ENCOUNTER — Encounter: Payer: Self-pay | Admitting: Psychiatry

## 2015-12-15 VITALS — BP 138/82 | HR 93 | Temp 97.9°F | Ht 62.0 in | Wt 195.8 lb

## 2015-12-15 DIAGNOSIS — F331 Major depressive disorder, recurrent, moderate: Secondary | ICD-10-CM

## 2015-12-15 DIAGNOSIS — F411 Generalized anxiety disorder: Secondary | ICD-10-CM

## 2015-12-15 MED ORDER — ZOLPIDEM TARTRATE 10 MG PO TABS: 10.0000 mg | ORAL_TABLET | Freq: Every day | ORAL | Status: DC

## 2015-12-15 MED ORDER — CLONAZEPAM 0.5 MG PO TABS: 0.5000 mg | ORAL_TABLET | Freq: Every day | ORAL | Status: DC
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
## 2016-02-11 ENCOUNTER — Encounter: Payer: Self-pay | Admitting: Psychiatry

## 2016-02-11 ENCOUNTER — Ambulatory Visit (INDEPENDENT_AMBULATORY_CARE_PROVIDER_SITE_OTHER): Payer: Medicare Other | Admitting: Psychiatry

## 2016-02-11 VITALS — BP 102/70 | HR 92 | Ht 62.0 in | Wt 196.6 lb

## 2016-02-11 DIAGNOSIS — F331 Major depressive disorder, recurrent, moderate: Secondary | ICD-10-CM

## 2016-02-11 MED ORDER — ZOLPIDEM TARTRATE 10 MG PO TABS: 10.0000 mg | ORAL_TABLET | Freq: Every day | ORAL | Status: DC

## 2016-02-11 MED ORDER — CLONAZEPAM 0.5 MG PO TABS: 0.5000 mg | ORAL_TABLET | Freq: Every day | ORAL | Status: DC
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
## 2016-05-13 ENCOUNTER — Encounter: Payer: Self-pay | Admitting: Psychiatry

## 2016-05-13 ENCOUNTER — Ambulatory Visit (INDEPENDENT_AMBULATORY_CARE_PROVIDER_SITE_OTHER): Payer: Medicare Other | Admitting: Psychiatry

## 2016-05-13 VITALS — BP 139/91 | HR 101 | Wt 196.6 lb

## 2016-05-13 DIAGNOSIS — F411 Generalized anxiety disorder: Secondary | ICD-10-CM | POA: Diagnosis not present

## 2016-05-13 DIAGNOSIS — F32A Depression, unspecified: Secondary | ICD-10-CM

## 2016-05-13 DIAGNOSIS — F329 Major depressive disorder, single episode, unspecified: Secondary | ICD-10-CM

## 2016-05-13 DIAGNOSIS — F418 Other specified anxiety disorders: Secondary | ICD-10-CM | POA: Diagnosis not present

## 2016-05-13 DIAGNOSIS — F331 Major depressive disorder, recurrent, moderate: Secondary | ICD-10-CM

## 2016-05-13 DIAGNOSIS — F419 Anxiety disorder, unspecified: Secondary | ICD-10-CM

## 2016-05-13 MED ORDER — VORTIOXETINE HBR 10 MG PO TABS: 1.0000 | ORAL_TABLET | Freq: Every day | ORAL | 1 refills | Status: DC

## 2016-05-13 MED ORDER — CLONAZEPAM 0.5 MG PO TABS
0.5000 mg | ORAL_TABLET | Freq: Every day | ORAL | 1 refills | Status: DC
Start: 2016-05-13 — End: 2016-07-13

## 2016-05-13 MED ORDER — ZOLPIDEM TARTRATE 10 MG PO TABS: 10.0000 mg | ORAL_TABLET | Freq: Every day | ORAL | 1 refills | Status: DC

## 2016-05-13 MED ORDER — VORTIOXETINE HBR 5 MG PO TABS: 5.0000 mg | ORAL_TABLET | Freq: Every morning | ORAL | 0 refills | Status: DC
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
## 2016-07-04 ENCOUNTER — Other Ambulatory Visit: Payer: Self-pay

## 2016-07-04 DIAGNOSIS — I10 Essential (primary) hypertension: Secondary | ICD-10-CM

## 2016-07-04 MED ORDER — LOSARTAN POTASSIUM-HCTZ 100-25 MG PO TABS: 1.0000 | ORAL_TABLET | Freq: Every day | ORAL | 0 refills | Status: DC

## 2016-07-06 ENCOUNTER — Ambulatory Visit (INDEPENDENT_AMBULATORY_CARE_PROVIDER_SITE_OTHER): Payer: Medicare Other | Admitting: Family Medicine

## 2016-07-06 ENCOUNTER — Encounter: Payer: Self-pay | Admitting: Family Medicine

## 2016-07-06 VITALS — BP 110/80 | HR 80 | Ht 66.0 in | Wt 201.0 lb

## 2016-07-06 DIAGNOSIS — Z23 Encounter for immunization: Secondary | ICD-10-CM

## 2016-07-06 DIAGNOSIS — I10 Essential (primary) hypertension: Secondary | ICD-10-CM | POA: Diagnosis not present

## 2016-07-06 MED ORDER — LOSARTAN POTASSIUM-HCTZ 100-25 MG PO TABS: 1.0000 | ORAL_TABLET | Freq: Every day | ORAL | 2 refills | Status: DC

## 2016-07-06 NOTE — Progress Notes (Signed)
Name: Barbara Durham   MRN: [id number]    DOB: Sep 15, 1960   Date:07/06/2016       Progress Note  Subjective  Chief Complaint  Chief Complaint  Patient presents with  . Hypertension    Hypertension  This is a chronic problem. The current episode started more than 1 year ago. The problem has been gradually improving since onset. The problem is controlled. Pertinent negatives include no anxiety, blurred vision, chest pain, headaches, malaise/fatigue, neck pain, orthopnea, palpitations, peripheral edema, PND, shortness of breath or sweats. There are no associated agents to hypertension. There are no known risk factors for coronary artery disease. Past treatments include angiotensin blockers and diuretics. The current treatment provides moderate improvement. There are no compliance problems.  There is no history of angina, kidney disease, CAD/MI, heart failure, left ventricular hypertrophy, PVD, renovascular disease or retinopathy. There is no history of chronic renal disease or a hypertension causing med.    No problem-specific Assessment & Plan notes found for this encounter.   Past Medical History:  Diagnosis Date  . Anxiety   . Depression   . Hypertension     Past Surgical History:  Procedure Laterality Date  . arm fracture Left   . COLONOSCOPY  2012   normal  . DENTAL SURGERY    . FOOT SURGERY Bilateral   . VAGINAL HYSTERECTOMY      Family History  Problem Relation Age of Onset  . Heart disease Mother   . Hypertension Mother   . Dementia Mother   . Depression Mother   . Alzheimer's disease Father   . Stroke Father   . Heart disease Sister   . Arthritis Brother   . Depression Brother   . Depression Brother   . Gout Brother   . Hypertension Brother     Social History   Social History  . Marital status: Married    Spouse name: N/A  . Number of children: N/A  . Years of education: N/A   Occupational History  . Not on file.   Social History Main Topics   . Smoking status: Never Smoker  . Smokeless tobacco: Never Used  . Alcohol use No     Comment: social  . Drug use: No  . Sexual activity: Not Currently    Birth control/ protection: None   Other Topics Concern  . Not on file   Social History Narrative  . No narrative on file    No Known Allergies   Review of Systems  Constitutional: Negative for chills, fever, malaise/fatigue and weight loss.  HENT: Negative for ear discharge, ear pain and sore throat.   Eyes: Negative for blurred vision.  Respiratory: Negative for cough, sputum production, shortness of breath and wheezing.   Cardiovascular: Negative for chest pain, palpitations, orthopnea, leg swelling and PND.  Gastrointestinal: Negative for abdominal pain, blood in stool, constipation, diarrhea, heartburn, melena and nausea.  Genitourinary: Negative for dysuria, frequency, hematuria and urgency.  Musculoskeletal: Negative for back pain, joint pain, myalgias and neck pain.  Skin: Negative for rash.  Neurological: Negative for dizziness, tingling, sensory change, focal weakness and headaches.  Endo/Heme/Allergies: Negative for environmental allergies and polydipsia. Does not bruise/bleed easily.  Psychiatric/Behavioral: Negative for depression and suicidal ideas. The patient is not nervous/anxious and does not have insomnia.      Objective  Vitals:   07/06/16 1036  BP: 110/80  Pulse: 80  Weight: 201 lb (91.2 kg)  Height: 5\' 6"  (1.676 m)  Physical Exam  Constitutional: She is well-developed, well-nourished, and in no distress. No distress.  HENT:  Head: Normocephalic and atraumatic.  Right Ear: External ear normal.  Left Ear: External ear normal.  Nose: Nose normal.  Mouth/Throat: Oropharynx is clear and moist.  Eyes: Conjunctivae and EOM are normal. Pupils are equal, round, and reactive to light. Right eye exhibits no discharge. Left eye exhibits no discharge.  Neck: Normal range of motion. Neck supple. No  JVD present. No thyromegaly present.  Cardiovascular: Normal rate, regular rhythm, normal heart sounds and intact distal pulses.  Exam reveals no gallop and no friction rub.   No murmur heard. Pulmonary/Chest: Effort normal and breath sounds normal. She has no wheezes. She has no rales.  Abdominal: Soft. Bowel sounds are normal. She exhibits no mass. There is no tenderness. There is no guarding.  Musculoskeletal: Normal range of motion. She exhibits no edema.  Lymphadenopathy:    She has no cervical adenopathy.  Neurological: She is alert. She has normal reflexes.  Skin: Skin is warm and dry. She is not diaphoretic.  Psychiatric: Mood and affect normal.  Nursing note and vitals reviewed.     Assessment & Plan  Problem List Items Addressed This Visit      Cardiovascular and Mediastinum   Hypertension   Relevant Medications   losartan-hydrochlorothiazide (HYZAAR) 100-25 MG tablet   Other Relevant Orders   Renal function panel    Other Visit Diagnoses    Immunization due    -  Primary   Relevant Orders   Flu Vaccine QUAD 36+ mos PF IM (Fluarix & Fluzone Quad PF) (Completed)   Tdap vaccine greater than or equal to 7yo IM (Completed)        Dr. [facility]  07/06/16

## 2016-07-11 ENCOUNTER — Other Ambulatory Visit: Payer: Self-pay | Admitting: Psychiatry

## 2016-07-13 ENCOUNTER — Encounter: Payer: Self-pay | Admitting: Psychiatry

## 2016-07-13 ENCOUNTER — Ambulatory Visit (INDEPENDENT_AMBULATORY_CARE_PROVIDER_SITE_OTHER): Payer: Medicare Other | Admitting: Psychiatry

## 2016-07-13 VITALS — BP 122/85 | HR 81 | Temp 97.5°F | Wt 199.4 lb

## 2016-07-13 DIAGNOSIS — F331 Major depressive disorder, recurrent, moderate: Secondary | ICD-10-CM

## 2016-07-13 DIAGNOSIS — F411 Generalized anxiety disorder: Secondary | ICD-10-CM

## 2016-07-13 MED ORDER — CLONAZEPAM 0.5 MG PO TABS: 0.5000 mg | ORAL_TABLET | Freq: Every day | ORAL | 1 refills | Status: DC

## 2016-07-13 MED ORDER — VORTIOXETINE HBR 20 MG PO TABS: 20.0000 mg | ORAL_TABLET | Freq: Every day | ORAL | 0 refills | Status: DC

## 2016-07-13 MED ORDER — ZOLPIDEM TARTRATE 10 MG PO TABS: 10.0000 mg | ORAL_TABLET | Freq: Every day | ORAL | 1 refills | Status: DC
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
## 2016-08-16 ENCOUNTER — Telehealth: Payer: Self-pay

## 2016-08-16 NOTE — Telephone Encounter (Signed)
pt is erolled in the [facility] [employer] will received free medication throught the program until 07-24-17.  medication need to be 90 day supply and faxed to [phone]   phone # [phone].  [place] [address]

## 2016-08-17 ENCOUNTER — Other Ambulatory Visit: Payer: Self-pay | Admitting: Psychiatry

## 2016-09-12 ENCOUNTER — Other Ambulatory Visit: Payer: Self-pay | Admitting: Psychiatry

## 2016-09-13 NOTE — Telephone Encounter (Signed)
Called in rx ok for klonopin and ambien for #30 no additional refills.

## 2016-09-13 NOTE — Telephone Encounter (Signed)
pt called states she will not have enough medication since appt had to be moved because dr. Gretel Acre out

## 2016-09-14 ENCOUNTER — Ambulatory Visit: Payer: Medicare Other | Admitting: Psychiatry

## 2016-10-12 ENCOUNTER — Ambulatory Visit (INDEPENDENT_AMBULATORY_CARE_PROVIDER_SITE_OTHER): Payer: Medicare Other | Admitting: Psychiatry

## 2016-10-12 ENCOUNTER — Encounter: Payer: Self-pay | Admitting: Psychiatry

## 2016-10-12 VITALS — BP 130/86 | HR 89 | Temp 98.0°F | Wt 203.2 lb

## 2016-10-12 DIAGNOSIS — F411 Generalized anxiety disorder: Secondary | ICD-10-CM

## 2016-10-12 DIAGNOSIS — F331 Major depressive disorder, recurrent, moderate: Secondary | ICD-10-CM | POA: Diagnosis not present

## 2016-10-12 MED ORDER — ZOLPIDEM TARTRATE 10 MG PO TABS: 10.0000 mg | ORAL_TABLET | Freq: Every day | ORAL | 1 refills | Status: DC

## 2016-10-12 MED ORDER — CLONAZEPAM 0.5 MG PO TABS
0.5000 mg | ORAL_TABLET | Freq: Every day | ORAL | 1 refills | Status: DC
Start: 2016-10-12 — End: 2016-12-12
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
## 2016-12-12 ENCOUNTER — Encounter: Payer: Self-pay | Admitting: Psychiatry

## 2016-12-12 ENCOUNTER — Ambulatory Visit (INDEPENDENT_AMBULATORY_CARE_PROVIDER_SITE_OTHER): Payer: Medicare Other | Admitting: Psychiatry

## 2016-12-12 VITALS — BP 122/80 | HR 99 | Temp 97.5°F | Wt 203.0 lb

## 2016-12-12 DIAGNOSIS — F411 Generalized anxiety disorder: Secondary | ICD-10-CM

## 2016-12-12 DIAGNOSIS — F331 Major depressive disorder, recurrent, moderate: Secondary | ICD-10-CM

## 2016-12-12 MED ORDER — CLONAZEPAM 0.5 MG PO TABS: 0.5000 mg | ORAL_TABLET | Freq: Every day | ORAL | 0 refills | Status: DC

## 2016-12-12 MED ORDER — ZOLPIDEM TARTRATE 10 MG PO TABS: 10.0000 mg | ORAL_TABLET | Freq: Every day | ORAL | 1 refills | Status: DC
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
## 2017-02-06 ENCOUNTER — Encounter: Payer: Self-pay | Admitting: Psychiatry

## 2017-02-06 ENCOUNTER — Ambulatory Visit (INDEPENDENT_AMBULATORY_CARE_PROVIDER_SITE_OTHER): Payer: Medicare Other | Admitting: Psychiatry

## 2017-02-06 VITALS — BP 133/85 | HR 75 | Ht 61.25 in | Wt 203.0 lb

## 2017-02-06 DIAGNOSIS — F331 Major depressive disorder, recurrent, moderate: Secondary | ICD-10-CM

## 2017-02-06 DIAGNOSIS — F411 Generalized anxiety disorder: Secondary | ICD-10-CM

## 2017-02-06 MED ORDER — ZOLPIDEM TARTRATE 10 MG PO TABS: 10.0000 mg | ORAL_TABLET | Freq: Every day | ORAL | 2 refills | Status: DC
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
## 2017-02-15 ENCOUNTER — Ambulatory Visit (INDEPENDENT_AMBULATORY_CARE_PROVIDER_SITE_OTHER): Payer: Medicare Other

## 2017-02-15 ENCOUNTER — Ambulatory Visit (INDEPENDENT_AMBULATORY_CARE_PROVIDER_SITE_OTHER): Payer: Medicare Other | Admitting: Podiatry

## 2017-02-15 ENCOUNTER — Encounter: Payer: Self-pay | Admitting: Podiatry

## 2017-02-15 DIAGNOSIS — M779 Enthesopathy, unspecified: Secondary | ICD-10-CM

## 2017-02-15 NOTE — Progress Notes (Signed)
   Subjective:    Patient ID: Barbara Durham, female    DOB: 14-Jul-1961, 56 y.o.   MRN: [id number]  HPI: She presents today. Complaining of pain to the lateral ankles bilaterally. She also has some pulling sensation across the dorsum of the foot. She states and aching for several months she rolls her ankles quite often she states. She states that her ankles feel weak there is swelling and pulling sensation through the tops of the feet and sides.  Review of Systems  Constitutional: Positive for unexpected weight change.  All other systems reviewed and are negative.      Objective:   Physical Exam: Vital signs are stable alert and oriented 3. Pulses are palpable. Neurologic sensorium is intact. Deep tendon reflexes are intact. Muscle strength symmetrical and equal and full bilateral. Deep tendon reflexes are equal. Orthopedic evaluation restraints mild pes planus bilateral. She has tenderness on in range of subtalar joint range of motion otherwise ankle joint range of motion is good. She has tenderness on palpation of the sinus tarsi bilateral left greater than right. Radiographs today demonstrate no ankle abnormalities and no subtalar joint abnormalities.        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  Plan: I injected the area today with Kenalog and local anesthetic.

## 2017-03-15 ENCOUNTER — Ambulatory Visit: Payer: Medicare Other | Admitting: Podiatry

## 2017-04-12 DIAGNOSIS — H5203 Hypermetropia, bilateral: Secondary | ICD-10-CM | POA: Diagnosis not present

## 2017-04-12 DIAGNOSIS — H524 Presbyopia: Secondary | ICD-10-CM | POA: Diagnosis not present

## 2017-04-12 DIAGNOSIS — Z83511 Family history of glaucoma: Secondary | ICD-10-CM | POA: Diagnosis not present

## 2017-04-12 DIAGNOSIS — H40013 Open angle with borderline findings, low risk, bilateral: Secondary | ICD-10-CM | POA: Diagnosis not present

## 2017-04-12 DIAGNOSIS — H52223 Regular astigmatism, bilateral: Secondary | ICD-10-CM | POA: Diagnosis not present

## 2017-05-01 ENCOUNTER — Ambulatory Visit: Payer: Medicare Other | Admitting: Psychiatry

## 2017-05-01 ENCOUNTER — Ambulatory Visit (INDEPENDENT_AMBULATORY_CARE_PROVIDER_SITE_OTHER): Payer: Medicare Other | Admitting: Psychiatry

## 2017-05-01 DIAGNOSIS — F411 Generalized anxiety disorder: Secondary | ICD-10-CM

## 2017-05-01 DIAGNOSIS — F331 Major depressive disorder, recurrent, moderate: Secondary | ICD-10-CM

## 2017-05-01 MED ORDER — VORTIOXETINE HBR 20 MG PO TABS: 20.0000 mg | ORAL_TABLET | Freq: Every day | ORAL | 1 refills | Status: DC

## 2017-05-01 MED ORDER — AMITRIPTYLINE HCL 10 MG PO TABS: 10.0000 mg | ORAL_TABLET | Freq: Every day | ORAL | 1 refills | Status: DC
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
## 2017-05-15 ENCOUNTER — Ambulatory Visit (INDEPENDENT_AMBULATORY_CARE_PROVIDER_SITE_OTHER): Payer: Medicare Other | Admitting: Family Medicine

## 2017-05-15 ENCOUNTER — Encounter: Payer: Self-pay | Admitting: Family Medicine

## 2017-05-15 VITALS — BP 118/64 | HR 68 | Ht 66.0 in | Wt 202.0 lb

## 2017-05-15 DIAGNOSIS — E785 Hyperlipidemia, unspecified: Secondary | ICD-10-CM | POA: Diagnosis not present

## 2017-05-15 DIAGNOSIS — Z6831 Body mass index (BMI) 31.0-31.9, adult: Secondary | ICD-10-CM | POA: Diagnosis not present

## 2017-05-15 DIAGNOSIS — E6609 Other obesity due to excess calories: Secondary | ICD-10-CM | POA: Diagnosis not present

## 2017-05-15 DIAGNOSIS — I10 Essential (primary) hypertension: Secondary | ICD-10-CM | POA: Diagnosis not present

## 2017-05-15 DIAGNOSIS — Z23 Encounter for immunization: Secondary | ICD-10-CM

## 2017-05-15 MED ORDER — LOSARTAN POTASSIUM-HCTZ 100-25 MG PO TABS: 1.0000 | ORAL_TABLET | Freq: Every day | ORAL | 2 refills | Status: DC

## 2017-05-15 NOTE — Patient Instructions (Signed)

## 2017-05-15 NOTE — Progress Notes (Addendum)
Name: Barbara Durham   MRN: [id number]    DOB: Jan 25, 1961   Date:05/15/2017       Progress Note  Subjective  Chief Complaint  Chief Complaint  Patient presents with  . Hypertension    Hypertension  This is a chronic problem. The current episode started more than 1 year ago. The problem has been waxing and waning since onset. The problem is controlled. Pertinent negatives include no anxiety, blurred vision, chest pain, headaches, malaise/fatigue, neck pain, orthopnea, palpitations, peripheral edema, PND, shortness of breath or sweats. There are no associated agents to hypertension. There are no known risk factors for coronary artery disease. Past treatments include angiotensin blockers and diuretics. The current treatment provides moderate improvement. There are no compliance problems.  There is no history of angina, kidney disease, CAD/MI, CVA, heart failure, left ventricular hypertrophy, PVD or retinopathy. There is no history of chronic renal disease, a hypertension causing med or renovascular disease.    No problem-specific Assessment & Plan notes found for this encounter.   Past Medical History:  Diagnosis Date  . Anxiety   . Depression   . Hypertension     Past Surgical History:  Procedure Laterality Date  . arm fracture Left   . COLONOSCOPY  2012   normal  . DENTAL SURGERY    . FOOT SURGERY Bilateral   . VAGINAL HYSTERECTOMY      Family History  Problem Relation Age of Onset  . Heart disease Mother   . Hypertension Mother   . Dementia Mother   . Depression Mother   . Alzheimer's disease Father   . Stroke Father   . Heart disease Sister   . Arthritis Brother   . Depression Brother   . Depression Brother   . Gout Brother   . Hypertension Brother     Social History   Social History  . Marital status: Married    Spouse name: N/A  . Number of children: N/A  . Years of education: N/A   Occupational History  . Not on file.   Social History Main Topics   . Smoking status: Never Smoker  . Smokeless tobacco: Never Used  . Alcohol use No     Comment: social  . Drug use: No  . Sexual activity: Not Currently    Birth control/ protection: None   Other Topics Concern  . Not on file   Social History Narrative  . No narrative on file    No Known Allergies  Outpatient Medications Prior to Visit  Medication Sig Dispense Refill  . amitriptyline (ELAVIL) 10 MG tablet Take 1 tablet (10 mg total) by mouth at bedtime. 30 tablet 1  . vortioxetine HBr (TRINTELLIX) 20 MG TABS Take 20 mg by mouth daily. Pt is on PAP 90 tablet 1  . losartan-hydrochlorothiazide (HYZAAR) 100-25 MG tablet Take 1 tablet by mouth daily. 90 tablet 2   No facility-administered medications prior to visit.     Review of Systems  Constitutional: Negative for chills, fever, malaise/fatigue and weight loss.  HENT: Negative for ear discharge, ear pain and sore throat.   Eyes: Negative for blurred vision.  Respiratory: Negative for cough, sputum production, shortness of breath and wheezing.   Cardiovascular: Negative for chest pain, palpitations, orthopnea, leg swelling and PND.  Gastrointestinal: Negative for abdominal pain, blood in stool, constipation, diarrhea, heartburn, melena and nausea.  Genitourinary: Negative for dysuria, frequency, hematuria and urgency.  Musculoskeletal: Negative for back pain, joint pain, myalgias and neck pain.  Skin: Negative for rash.  Neurological: Negative for dizziness, tingling, sensory change, focal weakness and headaches.  Endo/Heme/Allergies: Negative for environmental allergies and polydipsia. Does not bruise/bleed easily.  Psychiatric/Behavioral: Negative for depression and suicidal ideas. The patient is not nervous/anxious and does not have insomnia.      Objective  Vitals:   05/15/17 1037  BP: 118/64  Pulse: 68  Weight: 202 lb (91.6 kg)  Height: 5\' 6"  (1.676 m)    Physical Exam  Constitutional: She is well-developed,  well-nourished, and in no distress. No distress.  HENT:  Head: Normocephalic and atraumatic.  Right Ear: External ear normal.  Left Ear: External ear normal.  Nose: Nose normal.  Mouth/Throat: Oropharynx is clear and moist.  Eyes: Pupils are equal, round, and reactive to light. Conjunctivae and EOM are normal. Right eye exhibits no discharge. Left eye exhibits no discharge.  Neck: Normal range of motion. Neck supple. No JVD present. No thyromegaly present.  Cardiovascular: Normal rate, regular rhythm, normal heart sounds and intact distal pulses.  Exam reveals no gallop and no friction rub.   No murmur heard. Pulmonary/Chest: Effort normal and breath sounds normal. She has no wheezes. She has no rales.  Abdominal: Soft. Bowel sounds are normal. She exhibits no mass. There is no tenderness. There is no guarding.  Musculoskeletal: Normal range of motion. She exhibits no edema.  Lymphadenopathy:    She has no cervical adenopathy.  Neurological: She is alert. She has normal reflexes.  Skin: Skin is warm and dry. She is not diaphoretic.  Psychiatric: Mood and affect normal.  Nursing note and vitals reviewed.     Assessment & Plan  Problem List Items Addressed This Visit      Cardiovascular and Mediastinum   Hypertension - Primary   Relevant Medications   losartan-hydrochlorothiazide (HYZAAR) 100-25 MG tablet   Other Relevant Orders   Renal Function Panel    Other Visit Diagnoses    Hyperlipidemia, unspecified hyperlipidemia type       Relevant Medications   losartan-hydrochlorothiazide (HYZAAR) 100-25 MG tablet   Other Relevant Orders   Lipid Profile   Class 1 obesity due to excess calories without serious comorbidity with body mass index (BMI) of 31.0 to 31.9 in adult       Relevant Orders   Lipid Profile   Influenza vaccine needed       Relevant Orders   Flu Vaccine QUAD 36+ mos IM (Completed)      Meds ordered this encounter  Medications  .  losartan-hydrochlorothiazide (HYZAAR) 100-25 MG tablet    Sig: Take 1 tablet by mouth daily.    Dispense:  90 tablet    Refill:  2    sched appt for med refills      Dr. Otilio Miu [facility]  05/15/17

## 2017-05-15 NOTE — Addendum Note (Signed)
Addended by: Otilio Miu C on: 05/15/2017 11:21 AM   Modules accepted: Orders

## 2017-05-16 LAB — RENAL FUNCTION PANEL
ALBUMIN: 4.6 g/dL (ref 3.5–5.5)
BUN/Creatinine Ratio: 15 (ref 9–23)
BUN: 17 mg/dL (ref 6–24)
CO2: 23 mmol/L (ref 20–29)
CREATININE: 1.15 mg/dL — AB (ref 0.57–1.00)
Calcium: 9.5 mg/dL (ref 8.7–10.2)
Chloride: 98 mmol/L (ref 96–106)
GFR, EST AFRICAN AMERICAN: 61 mL/min/{1.73_m2} (ref >59)
GFR, EST NON AFRICAN AMERICAN: 53 mL/min/{1.73_m2} — AB (ref >59)
GLUCOSE: 79 mg/dL (ref 65–99)
POTASSIUM: 4 mmol/L (ref 3.5–5.2)
Phosphorus: 3.3 mg/dL (ref 2.5–4.5)
Sodium: 140 mmol/L (ref 134–144)

## 2017-05-16 LAB — LIPID PANEL
CHOL/HDL RATIO: 4.2 ratio (ref 0.0–4.4)
Cholesterol, Total: 222 mg/dL — ABNORMAL HIGH (ref 100–199)
HDL: 53 mg/dL (ref >39)
LDL Calculated: 146 mg/dL — ABNORMAL HIGH (ref 0–99)
Triglycerides: 113 mg/dL (ref 0–149)
VLDL CHOLESTEROL CAL: 23 mg/dL (ref 5–40)

## 2017-07-03 ENCOUNTER — Ambulatory Visit: Payer: Medicare Other | Admitting: Psychiatry

## 2017-07-19 ENCOUNTER — Ambulatory Visit (INDEPENDENT_AMBULATORY_CARE_PROVIDER_SITE_OTHER): Payer: Medicare Other | Admitting: Family Medicine

## 2017-07-19 ENCOUNTER — Ambulatory Visit
Admission: RE | Admit: 2017-07-19 | Discharge: 2017-07-19 | Disposition: A | Discharge status: Home / Self Care | Payer: Medicare Other | Source: Ambulatory Visit | Attending: Family Medicine | Admitting: Family Medicine

## 2017-07-19 ENCOUNTER — Encounter: Payer: Self-pay | Admitting: Family Medicine

## 2017-07-19 ENCOUNTER — Ambulatory Visit: Payer: Medicare Other

## 2017-07-19 VITALS — BP 100/68 | HR 88 | Ht 66.0 in | Wt 206.0 lb

## 2017-07-19 DIAGNOSIS — J4521 Mild intermittent asthma with (acute) exacerbation: Secondary | ICD-10-CM

## 2017-07-19 DIAGNOSIS — R0602 Shortness of breath: Secondary | ICD-10-CM | POA: Diagnosis not present

## 2017-07-19 DIAGNOSIS — J9811 Atelectasis: Secondary | ICD-10-CM | POA: Diagnosis not present

## 2017-07-19 DIAGNOSIS — J0100 Acute maxillary sinusitis, unspecified: Secondary | ICD-10-CM | POA: Diagnosis not present

## 2017-07-19 DIAGNOSIS — J40 Bronchitis, not specified as acute or chronic: Secondary | ICD-10-CM | POA: Diagnosis not present

## 2017-07-19 DIAGNOSIS — R05 Cough: Secondary | ICD-10-CM | POA: Diagnosis not present

## 2017-07-19 MED ORDER — LEVOFLOXACIN 500 MG PO TABS: 500.0000 mg | ORAL_TABLET | Freq: Every day | ORAL | 0 refills | Status: DC

## 2017-07-19 MED ORDER — ALBUTEROL SULFATE HFA 108 (90 BASE) MCG/ACT IN AERS: 2.0000 | INHALATION_SPRAY | Freq: Four times a day (QID) | RESPIRATORY_TRACT | 2 refills | Status: DC | PRN

## 2017-07-19 MED ORDER — PREDNISONE 10 MG PO TABS: 10.0000 mg | ORAL_TABLET | Freq: Every day | ORAL | 0 refills | Status: DC

## 2017-07-19 MED ORDER — GUAIFENESIN-CODEINE 100-10 MG/5ML PO SYRP: 5.0000 mL | ORAL_SOLUTION | Freq: Three times a day (TID) | ORAL | 0 refills | Status: DC | PRN

## 2017-07-19 MED ORDER — IPRATROPIUM-ALBUTEROL 0.5-2.5 (3) MG/3ML IN SOLN: 3.0000 mL | Freq: Once | RESPIRATORY_TRACT | Status: DC

## 2017-07-19 NOTE — Progress Notes (Signed)
Name: Barbara Durham   MRN: [id number]    DOB: April 20, 1961   Date:07/19/2017       Progress Note  Subjective  Chief Complaint  Chief Complaint  Patient presents with  . Sinusitis    cough and drainage- some production that is green, but not much. Has been taking mucinex- helping some. Sick x 1 week    Sinusitis  This is a recurrent problem. The current episode started in the past 7 days. The problem has been gradually worsening since onset. There has been no fever. The fever has been present for less than 1 day. The pain is mild. Pertinent negatives include no chills, congestion, coughing, diaphoresis, ear pain, headaches, hoarse voice, neck pain, shortness of breath, sinus pressure, sneezing, sore throat or swollen glands. Past treatments include nothing. The treatment provided mild relief.  Cough  This is a new problem. The current episode started more than 1 month ago. The problem has been gradually worsening. The cough is non-productive. Associated symptoms include nasal congestion, postnasal drip and wheezing. Pertinent negatives include no chest pain, chills, ear congestion, ear pain, fever, headaches, heartburn, hemoptysis, myalgias, rash, rhinorrhea, sore throat, shortness of breath, sweats or weight loss. The symptoms are aggravated by lying down. She has tried leukotriene antagonists for the symptoms. There is no history of environmental allergies.  Shortness of Breath  This is a new problem. The current episode started in the past 7 days. The problem occurs every few minutes. The problem has been waxing and waning. Associated symptoms include wheezing. Pertinent negatives include no abdominal pain, chest pain, claudication, ear pain, fever, headaches, hemoptysis, leg pain, leg swelling, neck pain, orthopnea, PND, rash, rhinorrhea, sore throat, sputum production or swollen glands. She has tried leukotriene antagonists for the symptoms.    No problem-specific Assessment & Plan notes  found for this encounter.   Past Medical History:  Diagnosis Date  . Anxiety   . Depression   . Hypertension     Past Surgical History:  Procedure Laterality Date  . arm fracture Left   . COLONOSCOPY  2012   normal  . DENTAL SURGERY    . FOOT SURGERY Bilateral   . VAGINAL HYSTERECTOMY      Family History  Problem Relation Age of Onset  . Heart disease Mother   . Hypertension Mother   . Dementia Mother   . Depression Mother   . Alzheimer's disease Father   . Stroke Father   . Heart disease Sister   . Arthritis Brother   . Depression Brother   . Depression Brother   . Gout Brother   . Hypertension Brother     Social History   Socioeconomic History  . Marital status: Married    Spouse name: Not on file  . Number of children: Not on file  . Years of education: Not on file  . Highest education level: Not on file  Social Needs  . Financial resource strain: Not on file  . Food insecurity - worry: Not on file  . Food insecurity - inability: Not on file  . Transportation needs - medical: Not on file  . Transportation needs - non-medical: Not on file  Occupational History  . Not on file  Tobacco Use  . Smoking status: Never Smoker  . Smokeless tobacco: Never Used  Substance and Sexual Activity  . Alcohol use: No    Alcohol/week: 0.0 - 0.6 oz    Comment: social  . Drug use: No  .  Sexual activity: Not Currently    Birth control/protection: None  Other Topics Concern  . Not on file  Social History Narrative  . Not on file    No Known Allergies  Outpatient Medications Prior to Visit  Medication Sig Dispense Refill  . amitriptyline (ELAVIL) 10 MG tablet Take 1 tablet (10 mg total) by mouth at bedtime. 30 tablet 1  . losartan-hydrochlorothiazide (HYZAAR) 100-25 MG tablet Take 1 tablet by mouth daily. 90 tablet 2  . vortioxetine HBr (TRINTELLIX) 20 MG TABS Take 20 mg by mouth daily. Pt is on PAP 90 tablet 1   No facility-administered medications prior to  visit.     Review of Systems  Constitutional: Negative for chills, diaphoresis, fever, malaise/fatigue and weight loss.  HENT: Positive for postnasal drip. Negative for congestion, ear discharge, ear pain, hoarse voice, rhinorrhea, sinus pressure, sneezing and sore throat.   Eyes: Negative for blurred vision.  Respiratory: Positive for wheezing. Negative for cough, hemoptysis, sputum production and shortness of breath.   Cardiovascular: Negative for chest pain, palpitations, orthopnea, claudication, leg swelling and PND.  Gastrointestinal: Negative for abdominal pain, blood in stool, constipation, diarrhea, heartburn, melena and nausea.  Genitourinary: Negative for dysuria, frequency, hematuria and urgency.  Musculoskeletal: Negative for back pain, joint pain, myalgias and neck pain.  Skin: Negative for rash.  Neurological: Negative for dizziness, tingling, sensory change, focal weakness and headaches.  Endo/Heme/Allergies: Negative for environmental allergies and polydipsia. Does not bruise/bleed easily.  Psychiatric/Behavioral: Negative for depression and suicidal ideas. The patient is not nervous/anxious and does not have insomnia.      Objective  Vitals:   07/19/17 0908  BP: 100/68  Pulse: 88  SpO2: 97%  Weight: 206 lb (93.4 kg)  Height: 5\' 6"  (1.676 m)    Physical Exam  Constitutional: She is well-developed, well-nourished, and in no distress. No distress.  HENT:  Head: Normocephalic and atraumatic.  Right Ear: External ear normal.  Left Ear: External ear normal.  Nose: Nose normal.  Mouth/Throat: Oropharynx is clear and moist.  Eyes: Conjunctivae and EOM are normal. Pupils are equal, round, and reactive to light. Right eye exhibits no discharge. Left eye exhibits no discharge.  Neck: Normal range of motion. Neck supple. No JVD present. No thyromegaly present.  Cardiovascular: Normal rate, regular rhythm, normal heart sounds and intact distal pulses. Exam reveals no  gallop and no friction rub.  No murmur heard. Pulmonary/Chest: Effort normal. She has no decreased breath sounds. She has wheezes. She has rhonchi. She has no rales.  Abdominal: Soft. Bowel sounds are normal. She exhibits no mass. There is no tenderness. There is no guarding.  Musculoskeletal: Normal range of motion. She exhibits no edema.  Lymphadenopathy:    She has no cervical adenopathy.  Neurological: She is alert. She has normal reflexes.  Skin: Skin is warm and dry. She is not diaphoretic.  Psychiatric: Mood and affect normal.  Nursing note and vitals reviewed.     Assessment & Plan  Problem List Items Addressed This Visit    None    Visit Diagnoses    Bronchitis    -  Primary   Relevant Medications   levofloxacin (LEVAQUIN) 500 MG tablet   guaiFENesin-codeine (ROBITUSSIN AC) 100-10 MG/5ML syrup   ipratropium-albuterol (DUONEB) 0.5-2.5 (3) MG/3ML nebulizer solution 3 mL   albuterol (PROVENTIL HFA;VENTOLIN HFA) 108 (90 Base) MCG/ACT inhaler   predniSONE (DELTASONE) 10 MG tablet   Other Relevant Orders   DG Chest 2 View (Completed)  Mild intermittent reactive airway disease with acute exacerbation       Relevant Medications   guaiFENesin-codeine (ROBITUSSIN AC) 100-10 MG/5ML syrup   ipratropium-albuterol (DUONEB) 0.5-2.5 (3) MG/3ML nebulizer solution 3 mL   albuterol (PROVENTIL HFA;VENTOLIN HFA) 108 (90 Base) MCG/ACT inhaler   predniSONE (DELTASONE) 10 MG tablet   Other Relevant Orders   DG Chest 2 View (Completed)   Acute non-recurrent maxillary sinusitis       Relevant Medications   levofloxacin (LEVAQUIN) 500 MG tablet   guaiFENesin-codeine (ROBITUSSIN AC) 100-10 MG/5ML syrup   predniSONE (DELTASONE) 10 MG tablet      Meds ordered this encounter  Medications  . levofloxacin (LEVAQUIN) 500 MG tablet    Sig: Take 1 tablet (500 mg total) by mouth daily.    Dispense:  7 tablet    Refill:  0  . guaiFENesin-codeine (ROBITUSSIN AC) 100-10 MG/5ML syrup    Sig:  Take 5 mLs by mouth 3 (three) times daily as needed for cough.    Dispense:  150 mL    Refill:  0  . ipratropium-albuterol (DUONEB) 0.5-2.5 (3) MG/3ML nebulizer solution 3 mL  . albuterol (PROVENTIL HFA;VENTOLIN HFA) 108 (90 Base) MCG/ACT inhaler    Sig: Inhale 2 puffs into the lungs every 6 (six) hours as needed for wheezing or shortness of breath.    Dispense:  1 Inhaler    Refill:  2  . predniSONE (DELTASONE) 10 MG tablet    Sig: Take 1 tablet (10 mg total) by mouth daily with breakfast.    Dispense:  30 tablet    Refill:  0      Dr. [facility]  07/19/17

## 2017-07-21 ENCOUNTER — Ambulatory Visit (INDEPENDENT_AMBULATORY_CARE_PROVIDER_SITE_OTHER): Payer: Medicare Other | Admitting: Family Medicine

## 2017-07-21 ENCOUNTER — Encounter: Payer: Self-pay | Admitting: Family Medicine

## 2017-07-21 VITALS — BP 110/70 | HR 86 | Ht 66.0 in | Wt 207.0 lb

## 2017-07-21 DIAGNOSIS — J40 Bronchitis, not specified as acute or chronic: Secondary | ICD-10-CM

## 2017-07-21 DIAGNOSIS — J4521 Mild intermittent asthma with (acute) exacerbation: Secondary | ICD-10-CM | POA: Diagnosis not present

## 2017-07-21 DIAGNOSIS — E6609 Other obesity due to excess calories: Secondary | ICD-10-CM | POA: Diagnosis not present

## 2017-07-21 DIAGNOSIS — Z6833 Body mass index (BMI) 33.0-33.9, adult: Secondary | ICD-10-CM

## 2017-07-21 NOTE — Progress Notes (Signed)
Name: Barbara Durham   MRN: [id number]    DOB: April 01, 1961   Date:07/21/2017       Progress Note  Subjective  Chief Complaint  Chief Complaint  Patient presents with  . Follow-up    bronchitis- started on Levaquin, prednisone, and inhaler- feeling "much better today"    Cough  This is a new problem. The current episode started in the past 7 days. The problem has been gradually improving. The problem occurs every few minutes. The cough is productive of purulent sputum (yellow). Associated symptoms include postnasal drip and wheezing. Pertinent negatives include no chest pain, chills, ear congestion, ear pain, fever, headaches, heartburn, hemoptysis, myalgias, nasal congestion, rash, rhinorrhea, sore throat, shortness of breath, sweats or weight loss. The symptoms are aggravated by lying down and cold air. She has tried a beta-agonist inhaler for the symptoms. The treatment provided moderate relief. Her past medical history is significant for asthma and bronchitis. There is no history of bronchiectasis, COPD, emphysema, environmental allergies or pneumonia.    No problem-specific Assessment & Plan notes found for this encounter.   Past Medical History:  Diagnosis Date  . Anxiety   . Depression   . Hypertension     Past Surgical History:  Procedure Laterality Date  . arm fracture Left   . COLONOSCOPY  2012   normal  . DENTAL SURGERY    . FOOT SURGERY Bilateral   . VAGINAL HYSTERECTOMY      Family History  Problem Relation Age of Onset  . Heart disease Mother   . Hypertension Mother   . Dementia Mother   . Depression Mother   . Alzheimer's disease Father   . Stroke Father   . Heart disease Sister   . Arthritis Brother   . Depression Brother   . Depression Brother   . Gout Brother   . Hypertension Brother     Social History   Socioeconomic History  . Marital status: Married    Spouse name: Not on file  . Number of children: Not on file  . Years of education:  Not on file  . Highest education level: Not on file  Social Needs  . Financial resource strain: Not on file  . Food insecurity - worry: Not on file  . Food insecurity - inability: Not on file  . Transportation needs - medical: Not on file  . Transportation needs - non-medical: Not on file  Occupational History  . Not on file  Tobacco Use  . Smoking status: Never Smoker  . Smokeless tobacco: Never Used  Substance and Sexual Activity  . Alcohol use: No    Alcohol/week: 0.0 - 0.6 oz    Comment: social  . Drug use: No  . Sexual activity: Not Currently    Birth control/protection: None  Other Topics Concern  . Not on file  Social History Narrative  . Not on file    No Known Allergies  Outpatient Medications Prior to Visit  Medication Sig Dispense Refill  . albuterol (PROVENTIL HFA;VENTOLIN HFA) 108 (90 Base) MCG/ACT inhaler Inhale 2 puffs into the lungs every 6 (six) hours as needed for wheezing or shortness of breath. 1 Inhaler 2  . amitriptyline (ELAVIL) 10 MG tablet Take 1 tablet (10 mg total) by mouth at bedtime. 30 tablet 1  . guaiFENesin-codeine (ROBITUSSIN AC) 100-10 MG/5ML syrup Take 5 mLs by mouth 3 (three) times daily as needed for cough. 150 mL 0  . levofloxacin (LEVAQUIN) 500 MG tablet Take 1  tablet (500 mg total) by mouth daily. 7 tablet 0  . losartan-hydrochlorothiazide (HYZAAR) 100-25 MG tablet Take 1 tablet by mouth daily. 90 tablet 2  . predniSONE (DELTASONE) 10 MG tablet Take 1 tablet (10 mg total) by mouth daily with breakfast. 30 tablet 0  . vortioxetine HBr (TRINTELLIX) 20 MG TABS Take 20 mg by mouth daily. Pt is on PAP 90 tablet 1   Facility-Administered Medications Prior to Visit  Medication Dose Route Frequency Provider Last Rate Last Dose  . ipratropium-albuterol (DUONEB) 0.5-2.5 (3) MG/3ML nebulizer solution 3 mL  3 mL Nebulization Once Juline Patch, MD        Review of Systems  Constitutional: Negative for chills, fever, malaise/fatigue and weight  loss.  HENT: Positive for postnasal drip. Negative for ear discharge, ear pain, rhinorrhea and sore throat.   Eyes: Negative for blurred vision.  Respiratory: Positive for cough and wheezing. Negative for hemoptysis, sputum production and shortness of breath.   Cardiovascular: Negative for chest pain, palpitations and leg swelling.  Gastrointestinal: Negative for abdominal pain, blood in stool, constipation, diarrhea, heartburn, melena and nausea.  Genitourinary: Negative for dysuria, frequency, hematuria and urgency.  Musculoskeletal: Negative for back pain, joint pain, myalgias and neck pain.  Skin: Negative for rash.  Neurological: Negative for dizziness, tingling, sensory change, focal weakness and headaches.  Endo/Heme/Allergies: Negative for environmental allergies and polydipsia. Does not bruise/bleed easily.  Psychiatric/Behavioral: Negative for depression and suicidal ideas. The patient is not nervous/anxious and does not have insomnia.      Objective  Vitals:   07/21/17 1045  BP: 110/70  Pulse: 86  SpO2: 99%  Weight: 207 lb (93.9 kg)  Height: 5\' 6"  (1.676 m)    Physical Exam  Constitutional: She is well-developed, well-nourished, and in no distress. No distress.  HENT:  Head: Normocephalic and atraumatic.  Right Ear: External ear normal.  Left Ear: External ear normal.  Nose: Nose normal.  Mouth/Throat: Oropharynx is clear and moist.  Eyes: Conjunctivae and EOM are normal. Pupils are equal, round, and reactive to light. Right eye exhibits no discharge. Left eye exhibits no discharge.  Neck: Normal range of motion. Neck supple. No JVD present. No thyromegaly present.  Cardiovascular: Normal rate, regular rhythm, normal heart sounds and intact distal pulses. Exam reveals no gallop and no friction rub.  No murmur heard. Pulmonary/Chest: Effort normal and breath sounds normal. She has no wheezes. She has no rales.  Abdominal: Soft. Bowel sounds are normal. She exhibits no  mass. There is no tenderness. There is no guarding.  Musculoskeletal: Normal range of motion. She exhibits no edema.  Lymphadenopathy:    She has no cervical adenopathy.  Neurological: She is alert. She has normal reflexes.  Skin: Skin is warm and dry. She is not diaphoretic.  Psychiatric: Mood and affect normal.  Nursing note and vitals reviewed.     Assessment & Plan  Problem List Items Addressed This Visit    None    Visit Diagnoses    Mild intermittent asthma with acute exacerbation    -  Primary   Bronchitis       Class 1 obesity due to excess calories without serious comorbidity with body mass index (BMI) of 33.0 to 33.9 in adult          No orders of the defined types were placed in this encounter.     Dr. Mac[facility]  07/21/17

## 2017-07-21 NOTE — Patient Instructions (Signed)

## 2017-08-28 ENCOUNTER — Ambulatory Visit (INDEPENDENT_AMBULATORY_CARE_PROVIDER_SITE_OTHER): Payer: Medicare Other | Admitting: Psychiatry

## 2017-08-28 ENCOUNTER — Encounter: Payer: Self-pay | Admitting: Psychiatry

## 2017-08-28 ENCOUNTER — Other Ambulatory Visit: Payer: Self-pay

## 2017-08-28 VITALS — BP 137/91 | HR 88 | Temp 97.2°F | Wt 208.2 lb

## 2017-08-28 DIAGNOSIS — F411 Generalized anxiety disorder: Secondary | ICD-10-CM

## 2017-08-28 DIAGNOSIS — F331 Major depressive disorder, recurrent, moderate: Secondary | ICD-10-CM

## 2017-08-28 MED ORDER — GABAPENTIN 100 MG PO CAPS: 100.0000 mg | ORAL_CAPSULE | Freq: Every day | ORAL | 1 refills | Status: DC
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
## 2017-09-27 ENCOUNTER — Telehealth: Payer: Self-pay

## 2017-09-27 NOTE — Telephone Encounter (Signed)
received fax pt is approved for the [employer] patient assistant program help at hand until 07-24-18 and will need to enroll at that time. for additional refills it will need to be fax a new 90 day rx to  [phone] or call [phone].

## 2017-10-30 ENCOUNTER — Encounter: Payer: Self-pay | Admitting: Psychiatry

## 2017-10-30 ENCOUNTER — Ambulatory Visit (INDEPENDENT_AMBULATORY_CARE_PROVIDER_SITE_OTHER): Payer: Medicare Other | Admitting: Psychiatry

## 2017-10-30 ENCOUNTER — Other Ambulatory Visit: Payer: Self-pay

## 2017-10-30 VITALS — BP 127/87 | HR 81 | Temp 97.4°F | Wt 204.2 lb

## 2017-10-30 DIAGNOSIS — F331 Major depressive disorder, recurrent, moderate: Secondary | ICD-10-CM | POA: Diagnosis not present

## 2017-10-30 DIAGNOSIS — F5101 Primary insomnia: Secondary | ICD-10-CM | POA: Diagnosis not present

## 2017-10-30 MED ORDER — ZALEPLON 5 MG PO CAPS: 5.0000 mg | ORAL_CAPSULE | Freq: Every evening | ORAL | 1 refills | Status: DC | PRN
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
## 2017-11-27 ENCOUNTER — Ambulatory Visit: Payer: Medicare Other | Admitting: Psychiatry

## 2017-11-27 ENCOUNTER — Ambulatory Visit (INDEPENDENT_AMBULATORY_CARE_PROVIDER_SITE_OTHER): Payer: Medicare Other | Admitting: Family Medicine

## 2017-11-27 ENCOUNTER — Encounter: Payer: Self-pay | Admitting: Family Medicine

## 2017-11-27 VITALS — BP 120/82 | HR 80 | Ht 66.0 in | Wt 200.0 lb

## 2017-11-27 DIAGNOSIS — H1013 Acute atopic conjunctivitis, bilateral: Secondary | ICD-10-CM | POA: Diagnosis not present

## 2017-11-27 DIAGNOSIS — J0100 Acute maxillary sinusitis, unspecified: Secondary | ICD-10-CM | POA: Diagnosis not present

## 2017-11-27 MED ORDER — OLOPATADINE HCL 0.2 % OP SOLN: 1.0000 [drp] | Freq: Every morning | OPHTHALMIC | 0 refills | Status: DC

## 2017-11-27 MED ORDER — AMOXICILLIN 500 MG PO CAPS: 500.0000 mg | ORAL_CAPSULE | Freq: Three times a day (TID) | ORAL | 0 refills | Status: DC

## 2017-11-27 NOTE — Progress Notes (Signed)
Name: Barbara Durham   MRN: [id number]    DOB: [birth year]   Date:11/27/2017       Progress Note  Subjective  Chief Complaint  Chief Complaint  Patient presents with  . Sinusitis    head pressure, cough- not productive, drainage    Sinusitis  This is a new problem. The current episode started in the past 7 days (thursday). The problem has been gradually worsening since onset. Maximum temperature: low grade friday/saturday. Her pain is at a severity of 1/10. Associated symptoms include congestion, coughing, headaches, sinus pressure, sneezing and a sore throat. Pertinent negatives include no chills, diaphoresis, ear pain, hoarse voice, neck pain, shortness of breath or swollen glands. (Prod/yellow nasal discharge) Past treatments include oral decongestants. The treatment provided mild relief.    No problem-specific Assessment & Plan notes found for this encounter.   Past Medical History:  Diagnosis Date  . Anxiety   . Depression   . Hypertension     Past Surgical History:  Procedure Laterality Date  . arm fracture Left   . COLONOSCOPY  2012   normal  . DENTAL SURGERY    . FOOT SURGERY Bilateral   . VAGINAL HYSTERECTOMY      Family History  Problem Relation Age of Onset  . Heart disease Mother   . Hypertension Mother   . Dementia Mother   . Depression Mother   . Alzheimer's disease Father   . Stroke Father   . Heart disease Sister   . Arthritis Brother   . Depression Brother   . Depression Brother   . Gout Brother   . Hypertension Brother     Social History   Socioeconomic History  . Marital status: Married    Spouse name: Not on file  . Number of children: Not on file  . Years of education: Not on file  . Highest education level: Not on file  Occupational History  . Not on file  Social Needs  . Financial resource strain: Not on file  . Food insecurity:    Worry: Not on file    Inability: Not on file  . Transportation needs:    Medical: Not on file     Non-medical: Not on file  Tobacco Use  . Smoking status: Never Smoker  . Smokeless tobacco: Never Used  Substance and Sexual Activity  . Alcohol use: No    Alcohol/week: 0.0 - 0.6 oz    Comment: social  . Drug use: No  . Sexual activity: Not Currently    Birth control/protection: None  Lifestyle  . Physical activity:    Days per week: Not on file    Minutes per session: Not on file  . Stress: Not on file  Relationships  . Social connections:    Talks on phone: Not on file    Gets together: Not on file    Attends religious service: Not on file    Active member of club or organization: Not on file    Attends meetings of clubs or organizations: Not on file    Relationship status: Not on file  . Intimate partner violence:    Fear of current or ex partner: Not on file    Emotionally abused: Not on file    Physically abused: Not on file    Forced sexual activity: Not on file  Other Topics Concern  . Not on file  Social History Narrative  . Not on file    No Known Allergies  Outpatient Medications Prior to Visit  Medication Sig Dispense Refill  . albuterol (PROVENTIL HFA;VENTOLIN HFA) 108 (90 Base) MCG/ACT inhaler Inhale 2 puffs into the lungs every 6 (six) hours as needed for wheezing or shortness of breath. 1 Inhaler 2  . losartan-hydrochlorothiazide (HYZAAR) 100-25 MG tablet Take 1 tablet by mouth daily. 90 tablet 2  . vortioxetine HBr (TRINTELLIX) 20 MG TABS Take 20 mg by mouth daily. Pt is on PAP 90 tablet 1  . zaleplon (SONATA) 5 MG capsule Take 1 capsule (5 mg total) by mouth at bedtime as needed for sleep. 30 capsule 1   Facility-Administered Medications Prior to Visit  Medication Dose Route Frequency Provider Last Rate Last Dose  . ipratropium-albuterol (DUONEB) 0.5-2.5 (3) MG/3ML nebulizer solution 3 mL  3 mL Nebulization Once Juline Patch, MD        Review of Systems  Constitutional: Negative for chills, diaphoresis, fever, malaise/fatigue and weight loss.   HENT: Positive for congestion, sinus pressure, sneezing and sore throat. Negative for ear discharge, ear pain and hoarse voice.   Eyes: Negative for blurred vision.  Respiratory: Positive for cough. Negative for sputum production, shortness of breath and wheezing.   Cardiovascular: Negative for chest pain, palpitations and leg swelling.  Gastrointestinal: Positive for abdominal pain. Negative for blood in stool, constipation, diarrhea, heartburn, melena and nausea.  Genitourinary: Negative for dysuria, frequency, hematuria and urgency.  Musculoskeletal: Negative for back pain, joint pain, myalgias and neck pain.  Skin: Negative for rash.  Neurological: Positive for headaches. Negative for dizziness, tingling, sensory change and focal weakness.  Endo/Heme/Allergies: Negative for environmental allergies and polydipsia. Does not bruise/bleed easily.  Psychiatric/Behavioral: Negative for depression and suicidal ideas. The patient is not nervous/anxious and does not have insomnia.      Objective  Vitals:   11/27/17 1208  BP: 120/82  Pulse: 80  Weight: 200 lb (90.7 kg)  Height: 5\' 6"  (1.676 m)    Physical Exam  Constitutional: She is oriented to person, place, and time. She appears well-developed and well-nourished.  HENT:  Head: Normocephalic.  Right Ear: Tympanic membrane, external ear and ear canal normal.  Left Ear: Tympanic membrane, external ear and ear canal normal.  Nose: Mucosal edema present. Right sinus exhibits maxillary sinus tenderness. Right sinus exhibits no frontal sinus tenderness. Left sinus exhibits maxillary sinus tenderness. Left sinus exhibits no frontal sinus tenderness.  Mouth/Throat: Uvula is midline and oropharynx is clear and moist. No oropharyngeal exudate, posterior oropharyngeal edema or posterior oropharyngeal erythema.  Eyes: Pupils are equal, round, and reactive to light. EOM are normal. Lids are everted and swept, no foreign bodies found. Left eye  exhibits no hordeolum. No foreign body present in the left eye. Right conjunctiva is injected. Left conjunctiva is injected. No scleral icterus.  Neck: Normal range of motion. Neck supple. No JVD present. No tracheal deviation present. No thyromegaly present.  Cardiovascular: Normal rate, regular rhythm, normal heart sounds and intact distal pulses. Exam reveals no gallop and no friction rub.  No murmur heard. Pulmonary/Chest: Effort normal and breath sounds normal. No respiratory distress. She has no wheezes. She has no rales.  Abdominal: Soft. Normal appearance and bowel sounds are normal. She exhibits no mass. There is no hepatosplenomegaly. There is no tenderness. There is no rebound and no guarding.  Musculoskeletal: Normal range of motion. She exhibits no edema or tenderness.  Lymphadenopathy:    She has no cervical adenopathy.  Neurological: She is alert and oriented to person, place, and  time. She has normal strength. She displays normal reflexes. No cranial nerve deficit.  Skin: Skin is warm. No rash noted.  Psychiatric: She has a normal mood and affect. Her mood appears not anxious. She does not exhibit a depressed mood.  Nursing note and vitals reviewed.     Assessment & Plan  Problem List Items Addressed This Visit    None    Visit Diagnoses    Acute maxillary sinusitis, recurrence not specified    -  Primary   Relevant Medications   amoxicillin (AMOXIL) 500 MG capsule   Acute atopic conjunctivitis of both eyes       Relevant Medications   Olopatadine HCl 0.2 % SOLN      Meds ordered this encounter  Medications  . amoxicillin (AMOXIL) 500 MG capsule    Sig: Take 1 capsule (500 mg total) by mouth 3 (three) times daily.    Dispense:  30 capsule    Refill:  0  . Olopatadine HCl 0.2 % SOLN    Sig: Apply 1 drop to eye every morning.    Dispense:  2.5 mL    Refill:  0      Dr. Otilio Miu [facility]  11/27/17

## 2017-12-04 ENCOUNTER — Ambulatory Visit: Payer: Self-pay | Admitting: Family Medicine

## 2017-12-07 ENCOUNTER — Ambulatory Visit (INDEPENDENT_AMBULATORY_CARE_PROVIDER_SITE_OTHER): Payer: Medicare Other | Admitting: Psychiatry

## 2017-12-07 ENCOUNTER — Encounter: Payer: Self-pay | Admitting: Psychiatry

## 2017-12-07 ENCOUNTER — Other Ambulatory Visit: Payer: Self-pay

## 2017-12-07 VITALS — BP 139/87 | HR 77 | Temp 97.4°F | Wt 200.2 lb

## 2017-12-07 DIAGNOSIS — F5101 Primary insomnia: Secondary | ICD-10-CM

## 2017-12-07 DIAGNOSIS — F331 Major depressive disorder, recurrent, moderate: Secondary | ICD-10-CM

## 2017-12-07 MED ORDER — QUETIAPINE FUMARATE 25 MG PO TABS: 25.0000 mg | ORAL_TABLET | Freq: Two times a day (BID) | ORAL | 1 refills | Status: DC
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
## 2018-01-01 ENCOUNTER — Encounter: Payer: Self-pay | Admitting: Psychiatry

## 2018-01-01 ENCOUNTER — Other Ambulatory Visit: Payer: Self-pay

## 2018-01-01 ENCOUNTER — Ambulatory Visit (INDEPENDENT_AMBULATORY_CARE_PROVIDER_SITE_OTHER): Payer: Medicare Other | Admitting: Psychiatry

## 2018-01-01 VITALS — BP 132/90 | HR 88 | Temp 97.6°F | Wt 198.6 lb

## 2018-01-01 DIAGNOSIS — F331 Major depressive disorder, recurrent, moderate: Secondary | ICD-10-CM | POA: Diagnosis not present

## 2018-01-01 DIAGNOSIS — F5101 Primary insomnia: Secondary | ICD-10-CM

## 2018-01-01 MED ORDER — QUETIAPINE FUMARATE 25 MG PO TABS: 25.0000 mg | ORAL_TABLET | Freq: Two times a day (BID) | ORAL | 1 refills | Status: DC

## 2018-01-01 MED ORDER — ALPRAZOLAM 0.5 MG PO TABS: 0.5000 mg | ORAL_TABLET | Freq: Every day | ORAL | 0 refills | Status: DC | PRN
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
## 2018-01-05 ENCOUNTER — Encounter: Payer: Self-pay | Admitting: *Deleted

## 2018-01-05 ENCOUNTER — Other Ambulatory Visit: Payer: Self-pay

## 2018-01-05 DIAGNOSIS — Y9289 Other specified places as the place of occurrence of the external cause: Secondary | ICD-10-CM | POA: Diagnosis not present

## 2018-01-05 DIAGNOSIS — Y9389 Activity, other specified: Secondary | ICD-10-CM | POA: Diagnosis not present

## 2018-01-05 DIAGNOSIS — H1132 Conjunctival hemorrhage, left eye: Secondary | ICD-10-CM | POA: Insufficient documentation

## 2018-01-05 DIAGNOSIS — Z79899 Other long term (current) drug therapy: Secondary | ICD-10-CM | POA: Insufficient documentation

## 2018-01-05 DIAGNOSIS — S0993XA Unspecified injury of face, initial encounter: Secondary | ICD-10-CM | POA: Diagnosis not present

## 2018-01-05 DIAGNOSIS — Y999 Unspecified external cause status: Secondary | ICD-10-CM | POA: Insufficient documentation

## 2018-01-05 DIAGNOSIS — I10 Essential (primary) hypertension: Secondary | ICD-10-CM | POA: Diagnosis not present

## 2018-01-05 DIAGNOSIS — S0232XA Fracture of orbital floor, left side, initial encounter for closed fracture: Secondary | ICD-10-CM | POA: Diagnosis not present
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
## 2018-01-06 ENCOUNTER — Emergency Department: Payer: Medicare Other

## 2018-01-06 ENCOUNTER — Emergency Department
Admission: EM | Admit: 2018-01-06 | Discharge: 2018-01-06 | Disposition: A | Discharge status: Home / Self Care | Payer: Medicare Other | Attending: Emergency Medicine | Admitting: Emergency Medicine

## 2018-01-06 DIAGNOSIS — S0993XA Unspecified injury of face, initial encounter: Secondary | ICD-10-CM | POA: Diagnosis not present

## 2018-01-06 DIAGNOSIS — S0232XA Fracture of orbital floor, left side, initial encounter for closed fracture: Secondary | ICD-10-CM

## 2018-01-06 DIAGNOSIS — H1132 Conjunctival hemorrhage, left eye: Secondary | ICD-10-CM

## 2018-01-06 MED ORDER — CEPHALEXIN 500 MG PO CAPS
500.0000 mg | ORAL_CAPSULE | Freq: Once | ORAL | Status: AC
  Administered 2018-01-06: 500 mg via ORAL
  Filled 2018-01-06: qty 1

## 2018-01-06 MED ORDER — HYDROCODONE-ACETAMINOPHEN 5-325 MG PO TABS: 1.0000 | ORAL_TABLET | ORAL | 0 refills | Status: DC | PRN
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
## 2018-01-06 NOTE — Discharge Instructions (Addendum)
As we discussed, your workup was notable for a fracture of the bones around your left eye.  1 of the muscles may be trapped in the fracture and it is important that you follow-up on Monday morning at the [facility] to be seen by Dr. Wallace Going or 1 of his colleagues.  As we discussed, please avoid taking NSAIDs (ibuprofen, naproxen, aspirin, BC powder, Goody powder, etc.) given the increased risk of bleeding.  Do not blow your nose.  If you need to sneeze, try to keep your mouth open to decrease the pressure.  Take the prescribed antibiotics as directed.    Please use over-the-counter Tylenol 1000 mg by mouth every six hours.  Take Norco as prescribed for severe pain. Do not drink alcohol, drive or participate in any other potentially dangerous activities while taking this medication as it may make you sleepy. Do not take this medication with any other sedating medications, either prescription or over-the-counter. If you were prescribed Percocet or Vicodin, do not take these with acetaminophen (Tylenol) as it is already contained within these medications.   This medication is an opiate (or narcotic) pain medication and can be habit forming.  Use it as little as possible to achieve adequate pain control.  Do not use or use it with extreme caution if you have a history of opiate abuse or dependence.  If you are on a pain contract with your primary care doctor or a pain specialist, be sure to let them know you were prescribed this medication today from the [facility] Emergency Department.  This medication is intended for your use only - do not give any to anyone else and keep it in a secure place where nobody else, especially children, have access to it.  It will also cause or worsen constipation, so you may want to consider taking an over-the-counter stool softener while you are taking this medication.  Return to the emergency department if you develop new or worsening symptoms that concern  you.

## 2018-01-06 NOTE — ED Notes (Signed)
Pt returned to ED Rm 16 from CT at this time.
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
## 2018-01-08 ENCOUNTER — Ambulatory Visit: Payer: Medicare Other | Admitting: Psychiatry

## 2018-01-08 DIAGNOSIS — S0232XA Fracture of orbital floor, left side, initial encounter for closed fracture: Secondary | ICD-10-CM | POA: Diagnosis not present

## 2018-01-09 DIAGNOSIS — S0292XA Unspecified fracture of facial bones, initial encounter for closed fracture: Secondary | ICD-10-CM | POA: Diagnosis not present

## 2018-01-09 DIAGNOSIS — S0232XA Fracture of orbital floor, left side, initial encounter for closed fracture: Secondary | ICD-10-CM | POA: Diagnosis not present

## 2018-01-10 ENCOUNTER — Encounter: Payer: Self-pay | Admitting: *Deleted

## 2018-01-10 ENCOUNTER — Other Ambulatory Visit: Payer: Self-pay

## 2018-01-15 NOTE — Discharge Instructions (Signed)
General Anesthesia, Adult, Care After °These instructions provide you with information about caring for yourself after your procedure. Your health care provider may also give you more specific instructions. Your treatment has been planned according to current medical practices, but problems sometimes occur. Call your health care provider if you have any problems or questions after your procedure. °What can I expect after the procedure? °After the procedure, it is common to have: °· Vomiting. °· A sore throat. °· Mental slowness. ° °It is common to feel: °· Nauseous. °· Cold or shivery. °· Sleepy. °· Tired. °· Sore or achy, even in parts of your body where you did not have surgery. ° °Follow these instructions at home: °For at least 24 hours after the procedure: °· Do not: °? Participate in activities where you could fall or become injured. °? Drive. °? Use heavy machinery. °? Drink alcohol. °? Take sleeping pills or medicines that cause drowsiness. °? Make important decisions or sign legal documents. °? Take care of children on your own. °· Rest. °Eating and drinking °· If you vomit, drink water, juice, or soup when you can drink without vomiting. °· Drink enough fluid to keep your urine clear or pale yellow. °· Make sure you have little or no nausea before eating solid foods. °· Follow the diet recommended by your health care provider. °General instructions °· Have a responsible adult stay with you until you are awake and alert. °· Return to your normal activities as told by your health care provider. Ask your health care provider what activities are safe for you. °· Take over-the-counter and prescription medicines only as told by your health care provider. °· If you smoke, do not smoke without supervision. °· Keep all follow-up visits as told by your health care provider. This is important. °Contact a health care provider if: °· You continue to have nausea or vomiting at home, and medicines are not helpful. °· You  cannot drink fluids or start eating again. °· You cannot urinate after 8-12 hours. °· You develop a skin rash. °· You have fever. °· You have increasing redness at the site of your procedure. °Get help right away if: °· You have difficulty breathing. °· You have chest pain. °· You have unexpected bleeding. °· You feel that you are having a life-threatening or urgent problem. °This information is not intended to replace advice given to you by your health care provider. Make sure you discuss any questions you have with your health care provider. °Document Released: 10/17/2000 Document Revised: 12/14/2015 Document Reviewed: 06/25/2015 °Elsevier Interactive Patient Education © 2018 [employer] Inc[employer] ° °

## 2018-01-16 ENCOUNTER — Ambulatory Visit
Admission: RE | Admit: 2018-01-16 | Discharge: 2018-01-16 | Disposition: A | Discharge status: Home / Self Care | Payer: Medicare Other | Source: Ambulatory Visit | Attending: Otolaryngology | Admitting: Otolaryngology

## 2018-01-16 ENCOUNTER — Ambulatory Visit: Payer: Medicare Other | Admitting: Anesthesiology

## 2018-01-16 ENCOUNTER — Encounter
Admission: RE | Disposition: A | Discharge status: Home / Self Care | Payer: Self-pay | Source: Ambulatory Visit | Attending: Otolaryngology

## 2018-01-16 DIAGNOSIS — I10 Essential (primary) hypertension: Secondary | ICD-10-CM | POA: Diagnosis not present

## 2018-01-16 DIAGNOSIS — Z79899 Other long term (current) drug therapy: Secondary | ICD-10-CM | POA: Insufficient documentation

## 2018-01-16 DIAGNOSIS — S0232XB Fracture of orbital floor, left side, initial encounter for open fracture: Secondary | ICD-10-CM | POA: Diagnosis not present

## 2018-01-16 DIAGNOSIS — X58XXXA Exposure to other specified factors, initial encounter: Secondary | ICD-10-CM | POA: Diagnosis not present

## 2018-01-16 DIAGNOSIS — F329 Major depressive disorder, single episode, unspecified: Secondary | ICD-10-CM | POA: Diagnosis not present

## 2018-01-16 DIAGNOSIS — S0232XA Fracture of orbital floor, left side, initial encounter for closed fracture: Secondary | ICD-10-CM | POA: Insufficient documentation

## 2018-01-16 DIAGNOSIS — S0292XA Unspecified fracture of facial bones, initial encounter for closed fracture: Secondary | ICD-10-CM | POA: Diagnosis not present

## 2018-01-16 DIAGNOSIS — F419 Anxiety disorder, unspecified: Secondary | ICD-10-CM | POA: Diagnosis not present

## 2018-01-16 HISTORY — DX: Nausea with vomiting, unspecified: R11.2

## 2018-01-16 HISTORY — DX: Other specified postprocedural states: Z98.890

## 2018-01-16 HISTORY — DX: Other specified postprocedural states: R11.2

## 2018-01-16 HISTORY — PX: ORIF ORBITAL FRACTURE: SHX5312

## 2018-01-16 SURGERY — OPEN REDUCTION INTERNAL FIXATION (ORIF) ORBITAL FRACTURE
Anesthesia: General | Site: Eye | Laterality: Left | Wound class: Clean

## 2018-01-16 MED ORDER — GLYCOPYRROLATE 0.2 MG/ML IJ SOLN
INTRAMUSCULAR | Status: DC | PRN
  Administered 2018-01-16: .1 mg via INTRAVENOUS

## 2018-01-16 MED ORDER — FENTANYL CITRATE (PF) 100 MCG/2ML IJ SOLN: 25.0000 ug | INTRAMUSCULAR | Status: DC | PRN

## 2018-01-16 MED ORDER — LACTATED RINGERS IV SOLN
10.0000 mL/h | INTRAVENOUS | Status: DC
  Administered 2018-01-16 (×2): 10 mL/h via INTRAVENOUS

## 2018-01-16 MED ORDER — OXYCODONE HCL 5 MG PO TABS
5.0000 mg | ORAL_TABLET | Freq: Once | ORAL | Status: AC | PRN
  Administered 2018-01-16: 5 mg via ORAL

## 2018-01-16 MED ORDER — GELATIN ADSORBABLE OP FILM
ORAL_FILM | OPHTHALMIC | Status: DC | PRN
Start: 2018-01-16 — End: 2018-01-16
  Administered 2018-01-16: 1 via OPHTHALMIC

## 2018-01-16 MED ORDER — NEOMYCIN-POLYMYXIN-DEXAMETH 3.5-10000-0.1 OP OINT
TOPICAL_OINTMENT | OPHTHALMIC | Status: DC | PRN
  Administered 2018-01-16: 1 via OPHTHALMIC

## 2018-01-16 MED ORDER — PROPOFOL 10 MG/ML IV BOLUS
INTRAVENOUS | Status: DC | PRN
  Administered 2018-01-16: 150 mg via INTRAVENOUS

## 2018-01-16 MED ORDER — DEXAMETHASONE SODIUM PHOSPHATE 4 MG/ML IJ SOLN
INTRAMUSCULAR | Status: DC | PRN
  Administered 2018-01-16: 10 mg via INTRAVENOUS

## 2018-01-16 MED ORDER — ONDANSETRON HCL 4 MG/2ML IJ SOLN
INTRAMUSCULAR | Status: DC | PRN
  Administered 2018-01-16: 4 mg via INTRAVENOUS

## 2018-01-16 MED ORDER — FENTANYL CITRATE (PF) 100 MCG/2ML IJ SOLN
INTRAMUSCULAR | Status: DC | PRN
  Administered 2018-01-16: 25 ug via INTRAVENOUS
  Administered 2018-01-16 (×2): 12.5 ug via INTRAVENOUS
  Administered 2018-01-16: 50 ug via INTRAVENOUS
  Administered 2018-01-16: 25 ug via INTRAVENOUS

## 2018-01-16 MED ORDER — ACETAMINOPHEN 10 MG/ML IV SOLN
1000.0000 mg | Freq: Once | INTRAVENOUS | Status: AC
  Administered 2018-01-16: 1000 mg via INTRAVENOUS

## 2018-01-16 MED ORDER — LIDOCAINE-EPINEPHRINE 1 %-1:100000 IJ SOLN
INTRAMUSCULAR | Status: DC | PRN
  Administered 2018-01-16: 1 mL

## 2018-01-16 MED ORDER — CEPHALEXIN 500 MG PO CAPS: 500.0000 mg | ORAL_CAPSULE | Freq: Two times a day (BID) | ORAL | 0 refills | Status: DC

## 2018-01-16 MED ORDER — LIDOCAINE HCL (CARDIAC) PF 100 MG/5ML IV SOSY
PREFILLED_SYRINGE | INTRAVENOUS | Status: DC | PRN
  Administered 2018-01-16: 40 mg via INTRATRACHEAL

## 2018-01-16 MED ORDER — MIDAZOLAM HCL 5 MG/5ML IJ SOLN
INTRAMUSCULAR | Status: DC | PRN
  Administered 2018-01-16: 2 mg via INTRAVENOUS

## 2018-01-16 MED ORDER — EPHEDRINE SULFATE 50 MG/ML IJ SOLN
INTRAMUSCULAR | Status: DC | PRN
  Administered 2018-01-16 (×2): 5 mg via INTRAVENOUS
  Administered 2018-01-16: 10 mg via INTRAVENOUS
  Administered 2018-01-16 (×2): 5 mg via INTRAVENOUS

## 2018-01-16 MED ORDER — HYDROCODONE-ACETAMINOPHEN 5-325 MG PO TABS: 1.0000 | ORAL_TABLET | Freq: Four times a day (QID) | ORAL | 0 refills | Status: DC | PRN

## 2018-01-16 MED ORDER — OXYCODONE HCL 5 MG/5ML PO SOLN: 5.0000 mg | Freq: Once | ORAL | Status: AC | PRN

## 2018-01-16 SURGICAL SUPPLY — 20 items
APPLICATOR COTTON TIP WD 3 STR (MISCELLANEOUS) ×4 IMPLANT
CANISTER SUCT 1200ML W/VALVE (MISCELLANEOUS) IMPLANT
DRAPE HEAD BAR (DRAPES) ×2 IMPLANT
ELECT CAUTERY NEEDLE 2.0 MIC (NEEDLE) ×2 IMPLANT
ELECT REM PT RETURN 9FT ADLT (ELECTROSURGICAL) ×2
ELECTRODE REM PT RTRN 9FT ADLT (ELECTROSURGICAL) ×1 IMPLANT
GLOVE BIO SURGEON STRL SZ7.5 (GLOVE) ×4 IMPLANT
KIT TURNOVER KIT A (KITS) ×2 IMPLANT
LEIBINGER  CMF PLATE (Plate) ×2 IMPLANT
LEIBINGER CMF SELF DRILLING SCREWS (Screw) ×4 IMPLANT
NEEDLE HYPO 25GX1X1/2 BEV (NEEDLE) ×2 IMPLANT
NS IRRIG 500ML POUR BTL (IV SOLUTION) ×2 IMPLANT
PACK DRAPE NASAL/ENT (PACKS) ×2 IMPLANT
PENCIL SMOKE EVACUATOR (MISCELLANEOUS) ×2 IMPLANT
SCRUB POVIDONE IODINE 4 OZ (MISCELLANEOUS) ×2 IMPLANT
SOLUTION OPHTHALMIC SALT (MISCELLANEOUS) ×2 IMPLANT
SUT PLAIN GUT (SUTURE) ×2 IMPLANT
SUT SILK 4 0 G 3 (SUTURE) ×2 IMPLANT
SYR 10ML LL (SYRINGE) ×2 IMPLANT
TOWEL OR 17X26 4PK STRL BLUE (TOWEL DISPOSABLE) IMPLANT

## 2018-01-16 NOTE — H&P (Signed)
History and physical reviewed and will be scanned in later. No change in medical status reported by the patient or family, appears stable for surgery. All questions regarding the procedure answered, and patient (or family if a child) expressed understanding of the procedure. ? ?Barbara Durham S Barbara Durham ?@TODAY@ ?

## 2018-01-16 NOTE — Op Note (Signed)
01/16/2018 10:02 AM  Barbara Durham [id number]   Pre-Op Dx: Left orbital blowout fracture  Post-op Dx: SAME  Procedure: Open reduction and internal fixation of left orbital blowout fracture with trans-conjunctival periorbital approach  Surgeon: Riley Nearing., MD  Anesthesia: General Endotracheal   EBL: Minimal   Complications: None   Findings: Comminuted fracture of the left orbital floor with protrusion of orbital fat into the left maxillary sinus.  Bone fragments were removed and a Leibinger mesh plate [id number] was placed and secured on the orbital rim with screws.  Procedure: After the patient was identified in holding and the history and physical and consent was reviewed, the patient was taken to the operating room and placed in a supine position. General endotracheal anesthesia was induced in the normal fashion. The left lower eyelid was injected with 1% lidocaine with epinephrine 1-100,000.  The patient was then prepped and draped in usual sterile fashion.  The conjunctiva was then incised with a 15 blade just below the edge of the tarsal plate.  The incision was carried down to the orbital septum which was dissected down to the orbital rim, dissecting between the septum and the orbicularis oculi muscle of the lower lid.  Minor bleeding was controlled with the needlepoint Bovie.  A 4-0 silk stitch was passed through the septal flap and used to retract it superiorly, protecting the cornea.  BSS was frequently applied during the procedure to re-wet the cornea.  The orbital rim was incised with the Bovie through the periosteum, and the periosteum elevated over the orbital rim and dissection proceeded into the floor of the orbit, dissecting the periosteum and working to elevate protruding orbital fat from the maxillary sinus.  There was a trapdoor deformity of the medial orbital floor.  This was reduced pulling the herniated fat back into the orbit, however the bone was fragmented  enough that it would not stay in place adequately without significant support.  A [id number] Leibinger mesh plate was placed to provide support and secured to the orbital rim with two 3 mm self drilling screws.  The plate was inspected and appeared to be in good anatomic position and supporting the orbital contents properly.  Small piece of Gelfilm was placed over the plate after irrigating the wound with BSS saline.  The incision was then closed with a 6-0 fast-absorbing gut suture in a running stitch with the knots buried.  Maxitrol was then applied to the lower lid.  Forced duction test was performed to confirm mobility of the eye.  The care of patient was returned to anesthesia, awakened, and transferred to recovery in stable condition.   Disposition: PACU to home   Plan: Regular diet. Ice pack to eye as needed for pain and swelling.  Apply Maxitrol to lower lid 3 times daily for 3 days.  Take antibiotics  and pain medication as prescribed. Recheck my office once week.   Riley Nearing., MD  10:02 AM 01/16/2018

## 2018-01-16 NOTE — Transfer of Care (Signed)
Immediate Anesthesia Transfer of Care Note  Patient: Barbara Durham  Procedure(s) Performed: OPEN REDUCTION INTERNAL FIXATION ORBITAL BLOW OUT FRACTURE (Left Eye)  Patient Location: PACU  Anesthesia Type: General  Level of Consciousness: awake, alert  and patient cooperative  Airway and Oxygen Therapy: Patient Spontanous Breathing and Patient connected to supplemental oxygen  Post-op Assessment: Post-op Vital signs reviewed, Patient's Cardiovascular Status Stable, Respiratory Function Stable, Patent Airway and No signs of Nausea or vomiting  Post-op Vital Signs: Reviewed and stable  Complications: No apparent anesthesia complications

## 2018-01-16 NOTE — Anesthesia Preprocedure Evaluation (Addendum)
Anesthesia Evaluation  Patient identified by MRN, date of birth, ID band Patient awake    Reviewed: Allergy & Precautions, NPO status , Patient's Chart, lab work & pertinent test results  History of Anesthesia Complications (+) PONV  Airway Mallampati: II  TM Distance: >3 FB     Dental   Pulmonary neg pulmonary ROS, neg recent URI,    breath sounds clear to auscultation       Cardiovascular hypertension, (-) angina Rhythm:Regular Rate:Normal     Neuro/Psych PSYCHIATRIC DISORDERS Anxiety Depression    GI/Hepatic negative GI ROS, neg GERD  ,  Endo/Other  BMI 35  Renal/GU      Musculoskeletal   Abdominal   Peds  Hematology negative hematology ROS (+)   Anesthesia Other Findings   Reproductive/Obstetrics                            Anesthesia Physical Anesthesia Plan  ASA: II  Anesthesia Plan: General   Post-op Pain Management:    Induction:   PONV Risk Score and Plan: Ondansetron, Dexamethasone and Midazolam  Airway Management Planned: LMA  Additional Equipment:   Intra-op Plan:   Post-operative Plan:   Informed Consent: I have reviewed the patients History and Physical, chart, labs and discussed the procedure including the risks, benefits and alternatives for the proposed anesthesia with the patient or authorized representative who has indicated his/her understanding and acceptance.     Plan Discussed with: CRNA  Anesthesia Plan Comments:         Anesthesia Quick Evaluation

## 2018-01-16 NOTE — Anesthesia Postprocedure Evaluation (Signed)
Anesthesia Post Note  Patient: Barbara Durham  Procedure(s) Performed: OPEN REDUCTION INTERNAL FIXATION ORBITAL BLOW OUT FRACTURE (Left Eye)  Patient location during evaluation: PACU Anesthesia Type: General Level of consciousness: awake Pain management: pain level controlled Vital Signs Assessment: post-procedure vital signs reviewed and stable Respiratory status: respiratory function stable Cardiovascular status: stable Postop Assessment: no signs of nausea or vomiting Anesthetic complications: no    Veda Canning

## 2018-01-16 NOTE — Anesthesia Procedure Notes (Signed)
Procedure Name: LMA Insertion Date/Time: 01/16/2018 7:47 AM Performed by: Mayme Genta, CRNA Pre-anesthesia Checklist: Patient identified, Emergency Drugs available, Suction available, Timeout performed and Patient being monitored Patient Re-evaluated:Patient Re-evaluated prior to induction Oxygen Delivery Method: Circle system utilized Preoxygenation: Pre-oxygenation with 100% oxygen Induction Type: IV induction LMA: LMA inserted LMA Size: 4.0 Number of attempts: 1 Placement Confirmation: positive ETCO2 and breath sounds checked- equal and bilateral Tube secured with: Tape

## 2018-01-17 ENCOUNTER — Encounter: Payer: Self-pay | Admitting: Otolaryngology

## 2018-01-29 ENCOUNTER — Encounter: Payer: Self-pay | Admitting: Psychiatry

## 2018-01-29 ENCOUNTER — Ambulatory Visit (INDEPENDENT_AMBULATORY_CARE_PROVIDER_SITE_OTHER): Payer: Medicare Other | Admitting: Psychiatry

## 2018-01-29 ENCOUNTER — Other Ambulatory Visit: Payer: Self-pay

## 2018-01-29 VITALS — BP 134/84 | HR 80 | Temp 97.4°F | Wt 198.2 lb

## 2018-01-29 DIAGNOSIS — F331 Major depressive disorder, recurrent, moderate: Secondary | ICD-10-CM

## 2018-01-29 DIAGNOSIS — F5101 Primary insomnia: Secondary | ICD-10-CM | POA: Diagnosis not present

## 2018-01-29 MED ORDER — QUETIAPINE FUMARATE 25 MG PO TABS: 25.0000 mg | ORAL_TABLET | Freq: Two times a day (BID) | ORAL | 1 refills | Status: DC
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
## 2018-03-02 ENCOUNTER — Other Ambulatory Visit: Payer: Self-pay

## 2018-03-02 DIAGNOSIS — I10 Essential (primary) hypertension: Secondary | ICD-10-CM

## 2018-03-02 MED ORDER — LOSARTAN POTASSIUM-HCTZ 100-25 MG PO TABS: 1.0000 | ORAL_TABLET | Freq: Every day | ORAL | 0 refills | Status: DC

## 2018-03-05 ENCOUNTER — Ambulatory Visit (INDEPENDENT_AMBULATORY_CARE_PROVIDER_SITE_OTHER): Payer: Medicare Other | Admitting: Family Medicine

## 2018-03-05 ENCOUNTER — Encounter: Payer: Self-pay | Admitting: Family Medicine

## 2018-03-05 VITALS — BP 110/80 | HR 80 | Ht 62.0 in | Wt 197.0 lb

## 2018-03-05 DIAGNOSIS — J4521 Mild intermittent asthma with (acute) exacerbation: Secondary | ICD-10-CM

## 2018-03-05 DIAGNOSIS — E7800 Pure hypercholesterolemia, unspecified: Secondary | ICD-10-CM

## 2018-03-05 DIAGNOSIS — I10 Essential (primary) hypertension: Secondary | ICD-10-CM

## 2018-03-05 DIAGNOSIS — F4321 Adjustment disorder with depressed mood: Secondary | ICD-10-CM

## 2018-03-05 MED ORDER — ALBUTEROL SULFATE HFA 108 (90 BASE) MCG/ACT IN AERS: 2.0000 | INHALATION_SPRAY | Freq: Four times a day (QID) | RESPIRATORY_TRACT | 2 refills | Status: DC | PRN

## 2018-03-05 MED ORDER — LOSARTAN POTASSIUM-HCTZ 100-25 MG PO TABS: 1.0000 | ORAL_TABLET | Freq: Every day | ORAL | 5 refills | Status: DC

## 2018-03-05 NOTE — Progress Notes (Signed)
Name: Barbara Durham   MRN: [id number]    DOB: 1961-04-30   Date:03/05/2018       Progress Note  Subjective  Chief Complaint  Chief Complaint  Patient presents with  . Hypertension    Hypertension  This is a chronic problem. The current episode started more than 1 year ago. The problem has been waxing and waning since onset. The problem is controlled. Pertinent negatives include no anxiety, blurred vision, chest pain, headaches, malaise/fatigue, neck pain, orthopnea, palpitations, peripheral edema, PND, shortness of breath or sweats. There are no associated agents to hypertension. There are no known risk factors for coronary artery disease. Past treatments include angiotensin blockers and diuretics. The current treatment provides moderate improvement. There are no compliance problems.  There is no history of angina, kidney disease, CAD/MI, CVA, heart failure, left ventricular hypertrophy, PVD or retinopathy. There is no history of chronic renal disease, a hypertension causing med or renovascular disease.  Asthma  She complains of frequent throat clearing and wheezing. There is no chest tightness, cough, difficulty breathing, hemoptysis, hoarse voice, shortness of breath or sputum production. This is a chronic problem. The current episode started more than 1 year ago. The problem occurs intermittently. The problem has been waxing and waning. Pertinent negatives include no appetite change, chest pain, dyspnea on exertion, ear congestion, ear pain, fever, headaches, heartburn, malaise/fatigue, myalgias, nasal congestion, orthopnea, PND, postnasal drip, rhinorrhea, sneezing, sore throat, sweats, trouble swallowing or weight loss. Her symptoms are aggravated by pollen. Her symptoms are alleviated by beta-agonist. Her past medical history is significant for asthma.  Depression         This is a chronic problem.  The onset quality is gradual.   The problem occurs intermittently.  The problem has been  waxing and waning since onset.  Associated symptoms include decreased interest and sad.  Associated symptoms include no fatigue, no helplessness, no hopelessness, does not have insomnia, not irritable, no restlessness, no appetite change, no myalgias, no headaches and no suicidal ideas.  Treatments tried: trintellix/seroquel.  Compliance with treatment is variable.  Previous treatment provided mild relief.   Pertinent negatives include no hypothyroidism and no anxiety. Anxiety  Presents for follow-up visit. Symptoms include nervous/anxious behavior. Patient reports no chest pain, dizziness, excessive worry, insomnia, nausea, palpitations, restlessness, shortness of breath or suicidal ideas. The severity of symptoms is mild.   Her past medical history is significant for asthma.  Hyperlipidemia  This is a chronic problem. The current episode started more than 1 year ago. The problem is uncontrolled. Recent lipid tests were reviewed and are high. Exacerbating diseases include obesity. She has no history of chronic renal disease, diabetes, hypothyroidism, liver disease or nephrotic syndrome. There are no known factors aggravating her hyperlipidemia. Pertinent negatives include no chest pain, focal weakness, myalgias or shortness of breath. Current antihyperlipidemic treatment includes diet change. There are no compliance problems.  Risk factors for coronary artery disease include dyslipidemia and hypertension.    Hypertension Chronic. Controlled. Continue losartin hydrochlorothizide 100/25 daily. Will check renal panel.   Past Medical History:  Diagnosis Date  . Anxiety   . Depression   . Hypertension   . PONV (postoperative nausea and vomiting)     Past Surgical History:  Procedure Laterality Date  . arm fracture Left   . COLONOSCOPY  2012   normal  . DENTAL SURGERY    . FOOT SURGERY Bilateral   . ORIF ORBITAL FRACTURE Left 01/16/2018   Procedure: OPEN REDUCTION INTERNAL  FIXATION ORBITAL  BLOW OUT FRACTURE;  Surgeon: Clyde Canterbury, MD;  Location: [facility];  Service: ENT;  Laterality: Left;  LEIBINGER SET    WIRE MESH  . VAGINAL HYSTERECTOMY      Family History  Problem Relation Age of Onset  . Heart disease Mother   . Hypertension Mother   . Dementia Mother   . Depression Mother   . Alzheimer's disease Father   . Stroke Father   . Heart disease Sister   . Arthritis Brother   . Depression Brother   . Depression Brother   . Gout Brother   . Hypertension Brother     Social History   Socioeconomic History  . Marital status: Married    Spouse name: Not on file  . Number of children: Not on file  . Years of education: Not on file  . Highest education level: Not on file  Occupational History  . Not on file  Social Needs  . Financial resource strain: Not on file  . Food insecurity:    Worry: Not on file    Inability: Not on file  . Transportation needs:    Medical: Not on file    Non-medical: Not on file  Tobacco Use  . Smoking status: Never Smoker  . Smokeless tobacco: Never Used  Substance and Sexual Activity  . Alcohol use: No    Alcohol/week: 0.0 - 1.0 standard drinks    Comment: social  . Drug use: No  . Sexual activity: Not Currently    Birth control/protection: None  Lifestyle  . Physical activity:    Days per week: Not on file    Minutes per session: Not on file  . Stress: Not on file  Relationships  . Social connections:    Talks on phone: Not on file    Gets together: Not on file    Attends religious service: Not on file    Active member of club or organization: Not on file    Attends meetings of clubs or organizations: Not on file    Relationship status: Not on file  . Intimate partner violence:    Fear of current or ex partner: Not on file    Emotionally abused: Not on file    Physically abused: Not on file    Forced sexual activity: Not on file  Other Topics Concern  . Not on file  Social History Narrative  . Not on  file    No Known Allergies  Outpatient Medications Prior to Visit  Medication Sig Dispense Refill  . Olopatadine HCl 0.2 % SOLN Apply 1 drop to eye every morning. 2.5 mL 0  . QUEtiapine (SEROQUEL) 25 MG tablet Take 1 tablet (25 mg total) by mouth 2 (two) times daily. 60 tablet 1  . vortioxetine HBr (TRINTELLIX) 20 MG TABS Take 20 mg by mouth daily. Pt is on PAP 90 tablet 1  . ALPRAZolam (XANAX) 0.5 MG tablet Take 1 tablet (0.5 mg total) by mouth daily as needed for anxiety. 15 tablet 0  . losartan-hydrochlorothiazide (HYZAAR) 100-25 MG tablet Take 1 tablet by mouth daily. 30 tablet 0  . albuterol (PROVENTIL HFA;VENTOLIN HFA) 108 (90 Base) MCG/ACT inhaler Inhale 2 puffs into the lungs every 6 (six) hours as needed for wheezing or shortness of breath. (Patient not taking: Reported on 03/05/2018) 1 Inhaler 2  . prednisoLONE acetate (PRED FORTE) 1 % ophthalmic suspension take 1 drop into left eye four times a day  0  No facility-administered medications prior to visit.     Review of Systems  Constitutional: Negative for appetite change, chills, fatigue, fever, malaise/fatigue and weight loss.  HENT: Negative for ear discharge, ear pain, hoarse voice, postnasal drip, rhinorrhea, sneezing, sore throat and trouble swallowing.   Eyes: Negative for blurred vision.  Respiratory: Positive for wheezing. Negative for cough, hemoptysis, sputum production and shortness of breath.   Cardiovascular: Negative for chest pain, dyspnea on exertion, palpitations, orthopnea, leg swelling and PND.  Gastrointestinal: Negative for abdominal pain, blood in stool, constipation, diarrhea, heartburn, melena and nausea.  Genitourinary: Negative for dysuria, frequency, hematuria and urgency.  Musculoskeletal: Negative for back pain, joint pain, myalgias and neck pain.  Skin: Negative for rash.  Neurological: Negative for dizziness, tingling, sensory change, focal weakness and headaches.  Endo/Heme/Allergies: Negative  for environmental allergies and polydipsia. Does not bruise/bleed easily.  Psychiatric/Behavioral: Positive for depression. Negative for suicidal ideas. The patient is nervous/anxious. The patient does not have insomnia.      Objective  Vitals:   03/05/18 0918  BP: 110/80  Pulse: 80  Weight: 197 lb (89.4 kg)  Height: 5\' 2"  (1.575 m)    Physical Exam  Constitutional: She is oriented to person, place, and time. She appears well-developed and well-nourished. She is not irritable.  HENT:  Head: Normocephalic.  Right Ear: External ear normal.  Left Ear: External ear normal.  Mouth/Throat: Oropharynx is clear and moist.  Eyes: Pupils are equal, round, and reactive to light. Conjunctivae and EOM are normal. Lids are everted and swept, no foreign bodies found. Left eye exhibits no hordeolum. No foreign body present in the left eye. Right conjunctiva is not injected. Left conjunctiva is not injected. No scleral icterus.  Neck: Normal range of motion. Neck supple. No JVD present. No tracheal deviation present. No thyromegaly present.  Cardiovascular: Normal rate, regular rhythm, normal heart sounds and intact distal pulses. Exam reveals no gallop and no friction rub.  No murmur heard. Pulmonary/Chest: Effort normal and breath sounds normal. No respiratory distress. She has no wheezes. She has no rales.  Abdominal: Soft. Bowel sounds are normal. She exhibits no mass. There is no hepatosplenomegaly. There is no tenderness. There is no rebound and no guarding.  Musculoskeletal: Normal range of motion. She exhibits no edema or tenderness.  Lymphadenopathy:    She has no cervical adenopathy.  Neurological: She is alert and oriented to person, place, and time. She has normal strength. She displays normal reflexes. No cranial nerve deficit.  Skin: Skin is warm. No rash noted.  Psychiatric: She has a normal mood and affect. Her mood appears not anxious. She does not exhibit a depressed mood.  Nursing  note and vitals reviewed.     Assessment & Plan  Problem List Items Addressed This Visit      Cardiovascular and Mediastinum   Hypertension - Primary    Chronic. Controlled. Continue losartin hydrochlorothizide 100/25 daily. Will check renal panel.      Relevant Medications   losartan-hydrochlorothiazide (HYZAAR) 100-25 MG tablet   Other Relevant Orders   Renal Function Panel    Other Visit Diagnoses    Mild intermittent reactive airway disease with acute exacerbation       Episodic. Controlled. Continue albuterol inhaler 2 puffs q 6 hr.    Relevant Medications   albuterol (PROVENTIL HFA;VENTOLIN HFA) 108 (90 Base) MCG/ACT inhaler   Pure hypercholesterolemia       Currently diet controlled. Will check lipid panel and will adjust accordingly.  Relevant Medications   losartan-hydrochlorothiazide (HYZAAR) 100-25 MG tablet   Grief reaction       Acute. Patient with recent death of mother. Still in depressive phase of grief reaction.      Meds ordered this encounter  Medications  . albuterol (PROVENTIL HFA;VENTOLIN HFA) 108 (90 Base) MCG/ACT inhaler    Sig: Inhale 2 puffs into the lungs every 6 (six) hours as needed for wheezing or shortness of breath.    Dispense:  1 Inhaler    Refill:  2  . losartan-hydrochlorothiazide (HYZAAR) 100-25 MG tablet    Sig: Take 1 tablet by mouth daily.    Dispense:  30 tablet    Refill:  5    sched appt for med refills      Dr. Otilio Miu [facility]  03/05/18

## 2018-03-05 NOTE — Patient Instructions (Addendum)
Mediterranean Diet A Mediterranean diet refers to food and lifestyle choices that are based on the traditions of countries located on the [place]. This way of eating has been shown to help prevent certain conditions and improve outcomes for people who have chronic diseases, like kidney disease and heart disease. What are tips for following this plan? Lifestyle  Cook and eat meals together with your family, when possible.  Drink enough fluid to keep your urine clear or pale yellow.  Be physically active every day. This includes: ? Aerobic exercise like running or swimming. ? Leisure activities like gardening, walking, or housework.  Get 7-8 hours of sleep each night.  If recommended by your health care provider, drink red wine in moderation. This means 1 glass a day for nonpregnant women and 2 glasses a day for men. A glass of wine equals 5 oz (150 mL). Reading food labels  Check the serving size of packaged foods. For foods such as rice and pasta, the serving size refers to the amount of cooked product, not dry.  Check the total fat in packaged foods. Avoid foods that have saturated fat or trans fats.  Check the ingredients list for added sugars, such as corn syrup. Shopping  At the grocery store, buy most of your food from the areas near the walls of the store. This includes: ? Fresh fruits and vegetables (produce). ? Grains, beans, nuts, and seeds. Some of these may be available in unpackaged forms or large amounts (in bulk). ? Fresh seafood. ? Poultry and eggs. ? Low-fat dairy products.  Buy whole ingredients instead of prepackaged foods.  Buy fresh fruits and vegetables in-season from local farmers markets.  Buy frozen fruits and vegetables in resealable bags.  If you do not have access to quality fresh seafood, buy precooked frozen shrimp or canned fish, such as tuna, salmon, or sardines.  Buy small amounts of raw or cooked vegetables, salads, or olives from the  deli or salad bar at your store.  Stock your pantry so you always have certain foods on hand, such as olive oil, canned tuna, canned tomatoes, rice, pasta, and beans. Cooking  Cook foods with extra-virgin olive oil instead of using butter or other vegetable oils.  Have meat as a side dish, and have vegetables or grains as your main dish. This means having meat in small portions or adding small amounts of meat to foods like pasta or stew.  Use beans or vegetables instead of meat in common dishes like chili or lasagna.  Experiment with different cooking methods. Try roasting or broiling vegetables instead of steaming or sauteing them.  Add frozen vegetables to soups, stews, pasta, or rice.  Add nuts or seeds for added healthy fat at each meal. You can add these to yogurt, salads, or vegetable dishes.  Marinate fish or vegetables using olive oil, lemon juice, garlic, and fresh herbs. Meal planning  Plan to eat 1 vegetarian meal one day each week. Try to work up to 2 vegetarian meals, if possible.  Eat seafood 2 or more times a week.  Have healthy snacks readily available, such as: ? Vegetable sticks with hummus. ? Greek yogurt. ? Fruit and nut trail mix.  Eat balanced meals throughout the week. This includes: ? Fruit: 2-3 servings a day ? Vegetables: 4-5 servings a day ? Low-fat dairy: 2 servings a day ? Fish, poultry, or lean meat: 1 serving a day ? Beans and legumes: 2 or more servings a week ? Nuts   and seeds: 1-2 servings a day ? Whole grains: 6-8 servings a day ? Extra-virgin olive oil: 3-4 servings a day  Limit red meat and sweets to only a few servings a month What are my food choices?  Mediterranean diet ? Recommended ? Grains: Whole-grain pasta. Brown rice. Bulgar wheat. Polenta. Couscous. Whole-wheat bread. [place]. ? Vegetables: Artichokes. Beets. Broccoli. Cabbage. Carrots. Eggplant. Green beans. Chard. Kale. Spinach. Onions. Leeks. Peas. Squash.  Tomatoes. Peppers. Radishes. ? Fruits: Apples. Apricots. Avocado. Berries. Bananas. Cherries. Dates. Figs. Grapes. Lemons. Melon. Oranges. Peaches. Plums. Pomegranate. ? Meats and other protein foods: Beans. Almonds. Sunflower seeds. Pine nuts. Peanuts. [place]. Salmon. Scallops. Shrimp. [place]. Tilapia. Clams. Oysters. Eggs. ? Dairy: Low-fat milk. Cheese. Greek yogurt. ? Beverages: Water. Red wine. Herbal tea. ? Fats and oils: Extra virgin olive oil. Avocado oil. Grape seed oil. ? Sweets and desserts: [place] yogurt with honey. Baked apples. Poached pears. Trail mix. ? Seasoning and other foods: Basil. Cilantro. Coriander. Cumin. Mint. Parsley. Sage. Rosemary. Tarragon. Garlic. Oregano. Thyme. Pepper. Balsalmic vinegar. Tahini. Hummus. Tomato sauce. Olives. Mushrooms. ? Limit these ? Grains: Prepackaged pasta or rice dishes. Prepackaged cereal with added sugar. ? Vegetables: Deep fried potatoes (french fries). ? Fruits: Fruit canned in syrup. ? Meats and other protein foods: Beef. Pork. Lamb. Poultry with skin. Hot dogs. [place] Salines. ? Dairy: Ice cream. Sour cream. Whole milk. ? Beverages: Juice. Sugar-sweetened soft drinks. Beer. Liquor and spirits. ? Fats and oils: Butter. Canola oil. Vegetable oil. Beef fat (tallow). Lard. ? Sweets and desserts: Cookies. Cakes. Pies. Candy. ? Seasoning and other foods: Mayonnaise. Premade sauces and marinades. ? The items listed may not be a complete list. Talk with your dietitian about what dietary choices are right for you. Summary  The Mediterranean diet includes both food and lifestyle choices.  Eat a variety of fresh fruits and vegetables, beans, nuts, seeds, and whole grains.  Limit the amount of red meat and sweets that you eat.  Talk with your health care provider about whether it is safe for you to drink red wine in moderation. This means 1 glass a day for nonpregnant women and 2 glasses a day for men. A glass of wine equals 5 oz (150 mL). This information  is not intended to replace advice given to you by your health care provider. Make sure you discuss any questions you have with your health care provider. Document Released: 03/03/2016 Document Revised: 04/05/2016 Document Reviewed: 03/03/2016 Elsevier Interactive Patient Education  2018 [place] With Loss, Adult People experience loss in many different ways throughout their lives. Events such as moving, changing jobs, and losing friends can create a sense of loss. The loss may be as serious as a major health change, divorce, death of a pet, or death of a loved one. All of these types of loss are likely to create a physical and emotional reaction known as grief. Grief is the result of a major change or an absence of something or someone that you count on. Grief is a normal reaction to loss. How to recognize changes A variety of factors can affect your grieving experience, including:  The nature of your loss.  Your relationship to what or whom you lost.  Your understanding of grief and how to cope with it.  Your support system.  The way that you deal with your grief will affect your ability to function as you normally do. When you are grieving, you may experience:  Numbness, shock, sadness, anxiety,  anger, denial, and guilt.  Thoughts about death.  Unexpected crying.  A physical sensation of emptiness in your gut.  Problems sleeping and eating.  Fatigue.  Loss of interest in normal activities.  Dreaming about or imagining seeing the person who died.  A need to remember what or whom you lost.  Difficulty thinking about anything other than your loss for a period of time.  Relief. If you have been expecting the loss for a while, you may feel a sense of relief when it happens.  Where to find support To get support for coping with loss:  Ask your health care provider for help and recommendations, such as grief counseling or therapy.  Think about joining a support  group for people who are coping with loss.  Follow these instructions at home:  Be patient with yourself and others. Allow the grieving process to happen, and remember that grieving takes time. ? It is likely that you may never feel completely done with some grief. You may find a way to move on while still cherishing memories and feelings about your loss. ? Accepting your loss is a process. It can take months or longer to adjust.  Express your feelings in healthy ways, such as: ? Talking with others about your loss. It may be helpful to find others who have had a similar loss, such as a support group. ? Writing down your feelings in a journal. ? Doing physical activities to release stress and emotional energy. ? Doing creative activities like painting, sculpting, or playing or listening to music. ? Practicing resilience. This is the ability to recover and adjust after facing challenges. Reading some resources that encourage resilience may help you to learn ways to practice those behaviors.  Keep to your normal routine as much as possible. If you have trouble focusing or doing normal activities, it is acceptable to take some time away from your normal routine.  Spend time with friends and loved ones.  Eat a healthy diet, get plenty of sleep, and rest when you feel tired. Where to find more information: You can find more information about coping with loss from:  American Society of Clinical Oncology: www.cancer.net  American Psychological Association: TVStereos.ch  Contact a health care provider if:  Your grief is extreme and keeps getting worse.  You have ongoing grief that does not improve.  Your body shows symptoms of grief, such as illness.  You feel depressed, anxious, or lonely. Get help right away if:  You have thoughts about hurting yourself or others. If you ever feel like you may hurt yourself or others, or have thoughts about taking your own life, get help right away.  You can go to your nearest emergency department or call:  Your local emergency services (911 in the [place]  A suicide crisis helpline, such as the [place] at (917)126-0331. This is open 24 hours a day.  Summary  Grief is a normal part of experiencing a loss. It is the result of a major change or an absence of something or someone that you count on.  The depth of grief and the period of recovery depend on the type of loss as well as your ability to adjust to the change and process your feelings.  Processing grief requires patience and a willingness to accept your feelings and talk about your loss with people who are supportive.  It is important to find resources that work for you and to realize that we are  all different when it comes to grief. There is not one single grieving process that works for everyone in the same way.  Be aware that when grief becomes extreme, it can lead to more severe issues like isolation, depression, anxiety, or suicidal thoughts. Talk with your health care provider if you have any of these issues. This information is not intended to replace advice given to you by your health care provider. Make sure you discuss any questions you have with your health care provider. Document Released: 11/24/2016 Document Revised: 11/24/2016 Document Reviewed: 11/24/2016 Elsevier Interactive Patient Education  2018 [employer].

## 2018-03-05 NOTE — Assessment & Plan Note (Signed)
Chronic. Controlled. Continue losartin hydrochlorothizide 100/25 daily. Will check renal panel.

## 2018-03-06 LAB — RENAL FUNCTION PANEL
ALBUMIN: 4.7 g/dL (ref 3.5–5.5)
BUN/Creatinine Ratio: 17 (ref 9–23)
BUN: 19 mg/dL (ref 6–24)
CHLORIDE: 98 mmol/L (ref 96–106)
CO2: 22 mmol/L (ref 20–29)
Calcium: 9.7 mg/dL (ref 8.7–10.2)
Creatinine, Ser: 1.14 mg/dL — ABNORMAL HIGH (ref 0.57–1.00)
GFR calc non Af Amer: 54 mL/min/{1.73_m2} — ABNORMAL LOW (ref >59)
GFR, EST AFRICAN AMERICAN: 62 mL/min/{1.73_m2} (ref >59)
Glucose: 89 mg/dL (ref 65–99)
Phosphorus: 3.3 mg/dL (ref 2.5–4.5)
Potassium: 4.2 mmol/L (ref 3.5–5.2)
Sodium: 140 mmol/L (ref 134–144)

## 2018-04-02 ENCOUNTER — Other Ambulatory Visit: Payer: Self-pay | Admitting: Family Medicine

## 2018-04-02 ENCOUNTER — Encounter: Payer: Self-pay | Admitting: Psychiatry

## 2018-04-02 ENCOUNTER — Ambulatory Visit (INDEPENDENT_AMBULATORY_CARE_PROVIDER_SITE_OTHER): Payer: Medicare Other | Admitting: Psychiatry

## 2018-04-02 ENCOUNTER — Other Ambulatory Visit: Payer: Self-pay

## 2018-04-02 VITALS — BP 154/88 | HR 71 | Temp 97.6°F | Wt 198.2 lb

## 2018-04-02 DIAGNOSIS — F331 Major depressive disorder, recurrent, moderate: Secondary | ICD-10-CM

## 2018-04-02 DIAGNOSIS — F5101 Primary insomnia: Secondary | ICD-10-CM

## 2018-04-02 DIAGNOSIS — I10 Essential (primary) hypertension: Secondary | ICD-10-CM

## 2018-04-02 MED ORDER — QUETIAPINE FUMARATE 25 MG PO TABS: 25.0000 mg | ORAL_TABLET | Freq: Every day | ORAL | 2 refills | Status: DC

## 2018-04-02 MED ORDER — VORTIOXETINE HBR 20 MG PO TABS: 20.0000 mg | ORAL_TABLET | Freq: Every day | ORAL | 3 refills | Status: DC
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
## 2018-06-04 ENCOUNTER — Ambulatory Visit (INDEPENDENT_AMBULATORY_CARE_PROVIDER_SITE_OTHER): Payer: Medicare Other | Admitting: Psychiatry

## 2018-06-04 ENCOUNTER — Encounter: Payer: Self-pay | Admitting: Psychiatry

## 2018-06-04 ENCOUNTER — Other Ambulatory Visit: Payer: Self-pay

## 2018-06-04 VITALS — BP 128/91 | HR 108 | Temp 97.5°F | Wt 201.6 lb

## 2018-06-04 DIAGNOSIS — F5101 Primary insomnia: Secondary | ICD-10-CM | POA: Diagnosis not present

## 2018-06-04 DIAGNOSIS — F331 Major depressive disorder, recurrent, moderate: Secondary | ICD-10-CM

## 2018-06-04 MED ORDER — QUETIAPINE FUMARATE 25 MG PO TABS: 25.0000 mg | ORAL_TABLET | Freq: Two times a day (BID) | ORAL | 2 refills | Status: DC
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
## 2018-08-02 ENCOUNTER — Other Ambulatory Visit: Payer: Self-pay | Admitting: Family Medicine

## 2018-08-02 DIAGNOSIS — H2513 Age-related nuclear cataract, bilateral: Secondary | ICD-10-CM | POA: Diagnosis not present

## 2018-08-02 DIAGNOSIS — H524 Presbyopia: Secondary | ICD-10-CM | POA: Diagnosis not present

## 2018-08-02 DIAGNOSIS — H52223 Regular astigmatism, bilateral: Secondary | ICD-10-CM | POA: Diagnosis not present

## 2018-08-02 DIAGNOSIS — H5203 Hypermetropia, bilateral: Secondary | ICD-10-CM | POA: Diagnosis not present

## 2018-08-02 DIAGNOSIS — I10 Essential (primary) hypertension: Secondary | ICD-10-CM

## 2018-08-02 DIAGNOSIS — H40013 Open angle with borderline findings, low risk, bilateral: Secondary | ICD-10-CM | POA: Diagnosis not present

## 2018-08-06 ENCOUNTER — Encounter: Payer: Self-pay | Admitting: Psychiatry

## 2018-08-06 ENCOUNTER — Other Ambulatory Visit: Payer: Self-pay

## 2018-08-06 ENCOUNTER — Ambulatory Visit (INDEPENDENT_AMBULATORY_CARE_PROVIDER_SITE_OTHER): Payer: Medicare Other | Admitting: Psychiatry

## 2018-08-06 VITALS — BP 122/82 | HR 84 | Temp 97.6°F | Wt 203.4 lb

## 2018-08-06 DIAGNOSIS — F331 Major depressive disorder, recurrent, moderate: Secondary | ICD-10-CM

## 2018-08-06 DIAGNOSIS — F5101 Primary insomnia: Secondary | ICD-10-CM

## 2018-08-06 MED ORDER — QUETIAPINE FUMARATE 25 MG PO TABS: 25.0000 mg | ORAL_TABLET | Freq: Every day | ORAL | 2 refills | Status: DC
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
## 2018-08-20 ENCOUNTER — Ambulatory Visit (INDEPENDENT_AMBULATORY_CARE_PROVIDER_SITE_OTHER): Payer: Medicare Other | Admitting: Obstetrics and Gynecology

## 2018-08-20 ENCOUNTER — Other Ambulatory Visit (HOSPITAL_COMMUNITY)
Admission: RE | Admit: 2018-08-20 | Discharge: 2018-08-20 | Disposition: A | Discharge status: Home / Self Care | Payer: Medicare Other | Source: Ambulatory Visit | Attending: Obstetrics and Gynecology | Admitting: Obstetrics and Gynecology

## 2018-08-20 ENCOUNTER — Encounter: Payer: Self-pay | Admitting: Obstetrics and Gynecology

## 2018-08-20 VITALS — BP 130/84 | HR 68 | Ht 62.0 in | Wt 203.0 lb

## 2018-08-20 DIAGNOSIS — Z124 Encounter for screening for malignant neoplasm of cervix: Secondary | ICD-10-CM

## 2018-08-20 DIAGNOSIS — Z1151 Encounter for screening for human papillomavirus (HPV): Secondary | ICD-10-CM | POA: Insufficient documentation

## 2018-08-20 DIAGNOSIS — Z1239 Encounter for other screening for malignant neoplasm of breast: Secondary | ICD-10-CM

## 2018-08-20 DIAGNOSIS — M85859 Other specified disorders of bone density and structure, unspecified thigh: Secondary | ICD-10-CM

## 2018-08-20 DIAGNOSIS — E28319 Asymptomatic premature menopause: Secondary | ICD-10-CM

## 2018-08-20 DIAGNOSIS — Z01419 Encounter for gynecological examination (general) (routine) without abnormal findings: Secondary | ICD-10-CM

## 2018-08-20 NOTE — Progress Notes (Signed)
PCP: Juline Patch, MD   Chief Complaint  Patient presents with  . Gynecologic Exam    Medicare breast/pelvic    HPI:      Ms. Barbara Durham is a 58 y.o. No obstetric history on file. who LMP was No LMP recorded. Patient has had a hysterectomy., presents today for her NP>3 yrs annual examination.  Her menses are absent due to [facility] 1993 for endometriosis. LSO 1998 (early menopause). Dysmenorrhea none. She does not have intermenstrual bleeding. She does not have vasomotor sx.   Sex activity: not sexually active. She does not have vaginal dryness.  Last Pap: October 06, 2009  Results were: no abnormalities /neg HPV DNA.  Hx of STDs: none  Last mammogram: not recent There is a FH of breast cancer in her pat aunt, genetic testing not indicated. There is no FH of ovarian cancer. The patient does not do self-breast exams.  Colonoscopy: colonoscopy 2013 at [facility] with Dr. Dionne Milo. Results unknown. Repeat due ?Marland Kitchen DEXA: 2013 at [facility] with osteopenia in hips. Sister with osteoporosis.   Tobacco use: The patient denies current or previous tobacco use. Alcohol use: none Exercise: not active  She does get adequate calcium but not Vitamin D in her diet.  Labs with PCP.  Pt still has issues with OAB/urge incont. Tried a couple meds in past but is very sensitive to meds. Saw urology with neg eval. Denies caffeine use. Drinks lots of water.   Past Medical History:  Diagnosis Date  . Anxiety   . Depression   . Endometriosis   . GERD (gastroesophageal reflux disease)   . Hemorrhoids   . Hypertension   . Nocturia    enuresis  . Osteopenia 10/2009  . PONV (postoperative nausea and vomiting)   . Stress incontinence, female     Past Surgical History:  Procedure Laterality Date  . arm fracture Left   . COLONOSCOPY  2012   normal  . CYSTOSCOPY  11/2011  . DENTAL SURGERY    . FOOT SURGERY Bilateral   . LAPAROSCOPY  04/10/1997  . LSO  1998   with LOA  .  ORIF ORBITAL FRACTURE Left 01/16/2018   Procedure: OPEN REDUCTION INTERNAL FIXATION ORBITAL BLOW OUT FRACTURE;  Surgeon: Clyde Canterbury, MD;  Location: [facility];  Service: ENT;  Laterality: Left;  LEIBINGER SET    WIRE MESH  . TOTAL ABDOMINAL HYSTERECTOMY  1993   with RSO  . TUBAL LIGATION  1989    Family History  Problem Relation Age of Onset  . Heart disease Mother   . Hypertension Mother   . Dementia Mother   . Depression Mother   . Diabetes Mother        type 2  . Alzheimer's disease Father   . Stroke Father   . CVA Father   . Prostate cancer Father   . Heart disease Sister   . Osteoporosis Sister   . Arthritis Brother   . Depression Brother   . Depression Brother   . Gout Brother   . Hypertension Brother   . Breast cancer Paternal Aunt        87s    Social History   Socioeconomic History  . Marital status: Married    Spouse name: Not on file  . Number of children: Not on file  . Years of education: Not on file  . Highest education level: Not on file  Occupational History  . Not on file  Social  Needs  . Financial resource strain: Not on file  . Food insecurity:    Worry: Not on file    Inability: Not on file  . Transportation needs:    Medical: Not on file    Non-medical: Not on file  Tobacco Use  . Smoking status: Never Smoker  . Smokeless tobacco: Never Used  Substance and Sexual Activity  . Alcohol use: No    Alcohol/week: 0.0 - 1.0 standard drinks    Comment: social  . Drug use: No  . Sexual activity: Not Currently    Birth control/protection: None  Lifestyle  . Physical activity:    Days per week: Not on file    Minutes per session: Not on file  . Stress: Not on file  Relationships  . Social connections:    Talks on phone: Not on file    Gets together: Not on file    Attends religious service: Not on file    Active member of club or organization: Not on file    Attends meetings of clubs or organizations: Not on file     Relationship status: Not on file  . Intimate partner violence:    Fear of current or ex partner: Not on file    Emotionally abused: Not on file    Physically abused: Not on file    Forced sexual activity: Not on file  Other Topics Concern  . Not on file  Social History Narrative  . Not on file    Outpatient Medications Prior to Visit  Medication Sig Dispense Refill  . albuterol (PROVENTIL HFA;VENTOLIN HFA) 108 (90 Base) MCG/ACT inhaler Inhale 2 puffs into the lungs every 6 (six) hours as needed for wheezing or shortness of breath. 1 Inhaler 2  . losartan-hydrochlorothiazide (HYZAAR) 100-25 MG tablet TAKE 1 TABLET BY MOUTH ONCE DAILY 30 tablet 0  . Olopatadine HCl 0.2 % SOLN Apply 1 drop to eye every morning. 2.5 mL 0  . QUEtiapine (SEROQUEL) 25 MG tablet Take 1 tablet (25 mg total) by mouth at bedtime. 60 tablet 2  . vortioxetine HBr (TRINTELLIX) 20 MG TABS tablet Take 1 tablet (20 mg total) by mouth daily. Pt is on PAP 90 tablet 3   No facility-administered medications prior to visit.      ROS:  Review of Systems  Constitutional: Negative for fatigue, fever and unexpected weight change.  Respiratory: Negative for cough, shortness of breath and wheezing.   Cardiovascular: Negative for chest pain, palpitations and leg swelling.  Gastrointestinal: Negative for blood in stool, constipation, diarrhea, nausea and vomiting.  Endocrine: Negative for cold intolerance, heat intolerance and polyuria.  Genitourinary: Negative for dyspareunia, dysuria, flank pain, frequency, genital sores, hematuria, menstrual problem, pelvic pain, urgency, vaginal bleeding, vaginal discharge and vaginal pain.  Musculoskeletal: Negative for back pain, joint swelling and myalgias.  Skin: Negative for rash.  Neurological: Negative for dizziness, syncope, light-headedness, numbness and headaches.  Hematological: Negative for adenopathy.  Psychiatric/Behavioral: Negative for agitation, confusion, sleep  disturbance and suicidal ideas. The patient is not nervous/anxious.    BREAST: No symptoms    Objective: BP 130/84   Pulse 68   Ht 5\' 2"  (1.575 m)   Wt 203 lb (92.1 kg)   BMI 37.13 kg/m    Physical Exam Constitutional:      Appearance: She is well-developed.  Genitourinary:     Vagina normal.     No vaginal discharge, erythema or tenderness.     No right or left adnexal mass  present.     Right adnexa not tender.     Left adnexa not tender.     Genitourinary Comments: UTERUS/CX SURG REM  Neck:     Musculoskeletal: Normal range of motion.     Thyroid: No thyromegaly.  Cardiovascular:     Rate and Rhythm: Normal rate and regular rhythm.     Heart sounds: Normal heart sounds. No murmur.  Pulmonary:     Effort: Pulmonary effort is normal.     Breath sounds: Normal breath sounds.  Chest:     Breasts:        Right: No mass, nipple discharge, skin change or tenderness.        Left: No mass, nipple discharge, skin change or tenderness.  Abdominal:     Palpations: Abdomen is soft.     Tenderness: There is no abdominal tenderness. There is no guarding.  Musculoskeletal: Normal range of motion.  Neurological:     Mental Status: She is alert and oriented to person, place, and time.     Cranial Nerves: No cranial nerve deficit.  Psychiatric:        Behavior: Behavior normal.  Vitals signs reviewed.     Assessment/Plan:  Encounter for annual routine gynecological examination  Cervical cancer screening - Plan: Cytology - PAP  Screening for breast cancer - pt to sched mammo  Osteopenia of hip, unspecified laterality - DEXA due. Cont ca/add Vit D supp - Plan: DG Bone Density  Early menopause - Plan: DG Bone Density          GYN counsel breast self exam, mammography screening, menopause, adequate intake of calcium and vitamin D, diet and exercise    F/U  Return in about 2 years (around 08/20/2020).  Amaury Kuzel B. Prapti Grussing, PA-C 08/20/2018 1:59 PM

## 2018-08-20 NOTE — Patient Instructions (Signed)
I value your feedback and entrusting us with your care. If you get a [facility] patient survey, I would appreciate you taking the time to let us know about your experience today. Thank you! 

## 2018-08-21 ENCOUNTER — Other Ambulatory Visit: Payer: Self-pay

## 2018-08-21 LAB — CYTOLOGY - PAP: Diagnosis: NEGATIVE

## 2018-08-31 ENCOUNTER — Telehealth: Payer: Self-pay | Admitting: Family Medicine

## 2018-08-31 ENCOUNTER — Other Ambulatory Visit: Payer: Self-pay

## 2018-08-31 ENCOUNTER — Other Ambulatory Visit: Payer: Self-pay | Admitting: Obstetrics and Gynecology

## 2018-08-31 DIAGNOSIS — I10 Essential (primary) hypertension: Secondary | ICD-10-CM

## 2018-08-31 DIAGNOSIS — Z1231 Encounter for screening mammogram for malignant neoplasm of breast: Secondary | ICD-10-CM

## 2018-08-31 MED ORDER — LOSARTAN POTASSIUM-HCTZ 100-25 MG PO TABS: 1.0000 | ORAL_TABLET | Freq: Every day | ORAL | 0 refills | Status: DC

## 2018-08-31 NOTE — Telephone Encounter (Signed)
Would like some sent in to pharmacy just to get her till appt date 09/05/18   losartan-hydrochlorothiazide (HYZAAR) 100-25 MG tablet [[id number]]

## 2018-08-31 NOTE — Telephone Encounter (Signed)
Please call patient and inform her I only sent in 15 tablet to get her through the next few weeks. She needs to make an appt next week in order to continue refills.

## 2018-09-03 ENCOUNTER — Ambulatory Visit (INDEPENDENT_AMBULATORY_CARE_PROVIDER_SITE_OTHER): Payer: Medicare Other | Admitting: Psychiatry

## 2018-09-03 ENCOUNTER — Other Ambulatory Visit: Payer: Self-pay

## 2018-09-03 ENCOUNTER — Encounter: Payer: Self-pay | Admitting: Psychiatry

## 2018-09-03 VITALS — BP 122/83 | HR 73 | Temp 97.5°F | Wt 203.3 lb

## 2018-09-03 DIAGNOSIS — F331 Major depressive disorder, recurrent, moderate: Secondary | ICD-10-CM | POA: Diagnosis not present

## 2018-09-03 DIAGNOSIS — F5101 Primary insomnia: Secondary | ICD-10-CM | POA: Diagnosis not present
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
## 2018-09-05 ENCOUNTER — Ambulatory Visit (INDEPENDENT_AMBULATORY_CARE_PROVIDER_SITE_OTHER): Payer: Medicare Other | Admitting: Family Medicine

## 2018-09-05 ENCOUNTER — Encounter: Payer: Self-pay | Admitting: Family Medicine

## 2018-09-05 VITALS — BP 120/80 | HR 60 | Ht 63.0 in | Wt 202.0 lb

## 2018-09-05 DIAGNOSIS — I10 Essential (primary) hypertension: Secondary | ICD-10-CM | POA: Diagnosis not present

## 2018-09-05 DIAGNOSIS — Z23 Encounter for immunization: Secondary | ICD-10-CM

## 2018-09-05 DIAGNOSIS — H1013 Acute atopic conjunctivitis, bilateral: Secondary | ICD-10-CM | POA: Diagnosis not present

## 2018-09-05 MED ORDER — OLOPATADINE HCL 0.2 % OP SOLN: 1.0000 [drp] | Freq: Every morning | OPHTHALMIC | 0 refills | Status: DC

## 2018-09-05 MED ORDER — LOSARTAN POTASSIUM-HCTZ 100-25 MG PO TABS: 1.0000 | ORAL_TABLET | Freq: Every day | ORAL | 1 refills | Status: DC

## 2018-09-05 NOTE — Progress Notes (Signed)
Date:  09/05/2018   Name:  Barbara Durham   DOB:  [birth year]   MRN:  [id number]   Chief Complaint: Hypertension (refill b/p meds/ needs labs); Eye Problem (wants a refill on patadine); and Flu Vaccine  Hypertension  This is a chronic problem. The current episode started more than 1 year ago. The problem is unchanged. The problem is controlled. Pertinent negatives include no anxiety, blurred vision, chest pain, headaches, malaise/fatigue, neck pain, orthopnea, palpitations, peripheral edema, PND, shortness of breath or sweats. There are no associated agents to hypertension. There are no known risk factors for coronary artery disease.  Eye Problem   Pertinent negatives include no blurred vision, eye discharge, eye redness, fever, itching, nausea, photophobia, vomiting or weakness.    Review of Systems  Constitutional: Negative.  Negative for chills, fatigue, fever, malaise/fatigue and unexpected weight change.  HENT: Negative for congestion, ear discharge, ear pain, rhinorrhea, sinus pressure, sneezing and sore throat.   Eyes: Negative for blurred vision, photophobia, pain, discharge, redness and itching.  Respiratory: Negative for cough, shortness of breath, wheezing and stridor.   Cardiovascular: Negative for chest pain, palpitations, orthopnea and PND.  Gastrointestinal: Negative for abdominal pain, blood in stool, constipation, diarrhea, nausea and vomiting.  Endocrine: Negative for cold intolerance, heat intolerance, polydipsia, polyphagia and polyuria.  Genitourinary: Negative for dysuria, flank pain, frequency, hematuria, menstrual problem, pelvic pain, urgency, vaginal bleeding and vaginal discharge.  Musculoskeletal: Negative for arthralgias, back pain, myalgias and neck pain.  Skin: Negative for rash.  Allergic/Immunologic: Negative for environmental allergies and food allergies.  Neurological: Negative for dizziness, weakness, light-headedness, numbness and headaches.    Hematological: Negative for adenopathy. Does not bruise/bleed easily.  Psychiatric/Behavioral: Negative for dysphoric mood. The patient is not nervous/anxious.     Patient Active Problem List   Diagnosis Date Noted  . Hypertension 01/21/2015  . Anxiety and depression 01/21/2015  . Back ache 06/14/2013  . Lumbar transverse process fracture ([place]) 06/14/2013    No Known Allergies  Past Surgical History:  Procedure Laterality Date  . arm fracture Left   . COLONOSCOPY  2012   normal  . CYSTOSCOPY  11/2011  . DENTAL SURGERY    . FOOT SURGERY Bilateral   . LAPAROSCOPY  04/10/1997  . LSO  1998   with LOA  . ORIF ORBITAL FRACTURE Left 01/16/2018   Procedure: OPEN REDUCTION INTERNAL FIXATION ORBITAL BLOW OUT FRACTURE;  Surgeon: Clyde Canterbury, MD;  Location: [place];  Service: ENT;  Laterality: Left;  LEIBINGER SET    WIRE MESH  . TOTAL ABDOMINAL HYSTERECTOMY  1993   with RSO  . TUBAL LIGATION  1989    Social History   Tobacco Use  . Smoking status: Never Smoker  . Smokeless tobacco: Never Used  Substance Use Topics  . Alcohol use: No    Alcohol/week: 0.0 - 1.0 standard drinks    Comment: social  . Drug use: No     Medication list has been reviewed and updated.  Current Meds  Medication Sig  . albuterol (PROVENTIL HFA;VENTOLIN HFA) 108 (90 Base) MCG/ACT inhaler Inhale 2 puffs into the lungs every 6 (six) hours as needed for wheezing or shortness of breath.  . losartan-hydrochlorothiazide (HYZAAR) 100-25 MG tablet Take 1 tablet by mouth daily.  . Olopatadine HCl 0.2 % SOLN Apply 1 drop to eye every morning.  . vortioxetine HBr (TRINTELLIX) 20 MG TABS tablet Take 1 tablet (20 mg total) by mouth daily. Pt is on PAP  PHQ 2/9 Scores 03/05/2018 10/19/2015  PHQ - 2 Score 2 2  PHQ- 9 Score 4 -    Physical Exam Vitals signs and nursing note reviewed.  Constitutional:      General: She is not in acute distress.    Appearance: She is not diaphoretic.  HENT:      Head: Normocephalic and atraumatic.     Right Ear: External ear normal.     Left Ear: External ear normal.     Nose: Nose normal.  Eyes:     General:        Right eye: No discharge.        Left eye: No discharge.     Conjunctiva/sclera: Conjunctivae normal.     Pupils: Pupils are equal, round, and reactive to light.  Neck:     Musculoskeletal: Normal range of motion and neck supple.     Thyroid: No thyromegaly.     Vascular: No JVD.  Cardiovascular:     Rate and Rhythm: Normal rate and regular rhythm.     Heart sounds: Normal heart sounds. No murmur. No friction rub. No gallop.   Pulmonary:     Effort: Pulmonary effort is normal.     Breath sounds: Normal breath sounds.  Abdominal:     General: Bowel sounds are normal.     Palpations: Abdomen is soft. There is no mass.     Tenderness: There is no abdominal tenderness. There is no guarding.  Musculoskeletal: Normal range of motion.  Lymphadenopathy:     Cervical: No cervical adenopathy.  Skin:    General: Skin is warm and dry.  Neurological:     Mental Status: She is alert.     Deep Tendon Reflexes: Reflexes are normal and symmetric.     BP 120/80   Pulse 60   Ht 5\' 3"  (1.6 m)   Wt 202 lb (91.6 kg)   BMI 35.78 kg/m   Assessment and Plan:  1. Acute atopic conjunctivitis of both eyes And having some atopic dermatitis involving both eyes will refill Pataday 1 drop to each eye every morning. - Olopatadine HCl 0.2 % SOLN; Apply 1 drop to eye every morning.  Dispense: 2.5 mL; Refill: 0  2. Essential hypertension Chronic.  Controlled.  Will continue losartan hydrochlorothiazide 100 mg/25 daily and will check a renal function panel. - Renal Function Panel - losartan-hydrochlorothiazide (HYZAAR) 100-25 MG tablet; Take 1 tablet by mouth daily.  Dispense: 90 tablet; Refill: 1

## 2018-09-06 LAB — RENAL FUNCTION PANEL
Albumin: 4.6 g/dL (ref 3.8–4.9)
BUN/Creatinine Ratio: 17 (ref 9–23)
BUN: 17 mg/dL (ref 6–24)
CALCIUM: 9.6 mg/dL (ref 8.7–10.2)
CO2: 25 mmol/L (ref 20–29)
Chloride: 102 mmol/L (ref 96–106)
Creatinine, Ser: 1.03 mg/dL — ABNORMAL HIGH (ref 0.57–1.00)
GFR calc Af Amer: 69 mL/min/{1.73_m2} (ref >59)
GFR calc non Af Amer: 60 mL/min/{1.73_m2} (ref >59)
GLUCOSE: 90 mg/dL (ref 65–99)
PHOSPHORUS: 2.9 mg/dL — AB (ref 3.0–4.3)
POTASSIUM: 4 mmol/L (ref 3.5–5.2)
SODIUM: 143 mmol/L (ref 134–144)

## 2018-10-11 ENCOUNTER — Other Ambulatory Visit: Payer: Medicare Other

## 2018-10-29 ENCOUNTER — Other Ambulatory Visit: Payer: Self-pay

## 2018-10-29 ENCOUNTER — Encounter: Payer: Self-pay | Admitting: Psychiatry

## 2018-10-29 ENCOUNTER — Ambulatory Visit (INDEPENDENT_AMBULATORY_CARE_PROVIDER_SITE_OTHER): Payer: Medicare Other | Admitting: Psychiatry

## 2018-10-29 DIAGNOSIS — F5101 Primary insomnia: Secondary | ICD-10-CM | POA: Diagnosis not present

## 2018-10-29 DIAGNOSIS — F331 Major depressive disorder, recurrent, moderate: Secondary | ICD-10-CM | POA: Diagnosis not present

## 2018-10-29 NOTE — Progress Notes (Signed)
Patient is a 58 year old female with history of depression followed up for her medications. She reported that she has been doing well and taking her medications and keeping to herself. She lives by herself with her animals. She reported that she has started taking melatonin committees to help her sleep at night. Patient reported that she is expecting three month supply of her medication in the mail shortly. She reported that she is concerned about the COVID but trying to stay safe. She does not have any acute symptoms at this time.  Plan Patient will receive her three month supply of the medications in the mail shortly. Advise patient to call if she notice worsening of her symptoms and she agreed with the plan.  I have discussed the assessment and treatment plan with the patient. The patient was provided an opportunity to ask questions and all were answered. The patient agreed with the plan and demonstrated an understanding of the instructions.   The patient was advised to call back or seek an in-person evaluation if the symptoms worsen or if the condition fails to improve as anticipated.   I provided 10 minutes of non-face-to-face time during this encounter.

## 2018-11-22 ENCOUNTER — Other Ambulatory Visit: Payer: Medicare Other

## 2019-01-11 ENCOUNTER — Encounter: Payer: Self-pay | Admitting: Obstetrics and Gynecology

## 2019-01-11 ENCOUNTER — Other Ambulatory Visit: Payer: Self-pay

## 2019-01-11 ENCOUNTER — Ambulatory Visit
Admission: RE | Admit: 2019-01-11 | Discharge: 2019-01-11 | Disposition: A | Discharge status: Home / Self Care | Payer: Medicare Other | Source: Ambulatory Visit | Attending: Obstetrics and Gynecology | Admitting: Obstetrics and Gynecology

## 2019-01-11 DIAGNOSIS — E28319 Asymptomatic premature menopause: Secondary | ICD-10-CM | POA: Insufficient documentation

## 2019-01-11 DIAGNOSIS — M85859 Other specified disorders of bone density and structure, unspecified thigh: Secondary | ICD-10-CM | POA: Insufficient documentation

## 2019-01-11 DIAGNOSIS — Z1231 Encounter for screening mammogram for malignant neoplasm of breast: Secondary | ICD-10-CM

## 2019-01-11 DIAGNOSIS — M85851 Other specified disorders of bone density and structure, right thigh: Secondary | ICD-10-CM | POA: Diagnosis not present

## 2019-01-14 ENCOUNTER — Encounter: Payer: Self-pay | Admitting: Obstetrics and Gynecology

## 2019-02-11 ENCOUNTER — Encounter: Payer: Self-pay | Admitting: Psychiatry

## 2019-02-11 ENCOUNTER — Other Ambulatory Visit: Payer: Self-pay

## 2019-02-11 ENCOUNTER — Ambulatory Visit (INDEPENDENT_AMBULATORY_CARE_PROVIDER_SITE_OTHER): Payer: Medicare Other | Admitting: Psychiatry

## 2019-02-11 DIAGNOSIS — F331 Major depressive disorder, recurrent, moderate: Secondary | ICD-10-CM | POA: Diagnosis not present

## 2019-02-11 DIAGNOSIS — F5101 Primary insomnia: Secondary | ICD-10-CM | POA: Diagnosis not present

## 2019-02-11 NOTE — Progress Notes (Signed)
Patient ID: Barbara Durham, female   DOB: Jul 04, 1961, 58 y.o.   MRN: [id number]   Patient is a 58 year old female with history of depression and anxiety who was followed up for medication management. She reported that she has been staying at home and trying to clean her house. She stated that she does not go out often. She stated that she has been taking Trentillex  on a regular basis. She gets that in the mail-order and the supply keeps coming.She reported that she is ready to order another three month supply soon. Patient reported that she will apply for the PAPTowards the end of this year. She takes Xanax and a PRN basis. Patient currently denied having any suicidal or homicidal ideation or plans. She's trying to stay safe. She denied having any suicidal or homicidal ideation the plans. No other acute symptoms noted at this time.  Plan. Patient has enough supply of the medication. She takes Xanax On PRN basis and does not need to Refill this time. Follow-up in two months or earlier depending on his symptoms.   I connected with patient via telemedicine application and verified that I am speaking with the correct person using two identifiers.  I discussed the limitations of evaluation and management by telemedicine and the availability of in person appointments. The patient expressed understanding and agreed to proceed.   I discussed the assessment and treatment plan with the patient. The patient was provided an opportunity to ask questions and all were answered. The patient agreed with the plan and demonstrated an understanding of the instructions.   The patient was advised to call back or seek an in-person evaluation if the symptoms worsen or if the condition fails to improve as anticipated.   I provided 15 minutes of non-face-to-face time during this encounter.

## 2019-03-01 ENCOUNTER — Ambulatory Visit: Payer: Self-pay | Admitting: Family Medicine

## 2019-03-20 ENCOUNTER — Other Ambulatory Visit: Payer: Self-pay

## 2019-03-20 ENCOUNTER — Ambulatory Visit (INDEPENDENT_AMBULATORY_CARE_PROVIDER_SITE_OTHER): Payer: Medicare Other | Admitting: Family Medicine

## 2019-03-20 ENCOUNTER — Encounter: Payer: Self-pay | Admitting: Family Medicine

## 2019-03-20 VITALS — BP 130/100 | HR 72 | Ht 63.0 in | Wt 199.0 lb

## 2019-03-20 DIAGNOSIS — E7800 Pure hypercholesterolemia, unspecified: Secondary | ICD-10-CM

## 2019-03-20 DIAGNOSIS — I10 Essential (primary) hypertension: Secondary | ICD-10-CM | POA: Diagnosis not present

## 2019-03-20 MED ORDER — LOSARTAN POTASSIUM-HCTZ 100-25 MG PO TABS: 1.0000 | ORAL_TABLET | Freq: Every day | ORAL | 1 refills | Status: DC

## 2019-03-20 NOTE — Progress Notes (Signed)
Date:  03/20/2019   Name:  Barbara Durham   DOB:  [birth year]   MRN:  [id number]   Chief Complaint: Hypertension (hasn't had med this morning) and influ vacc need  Hypertension This is a chronic problem. The current episode started more than 1 year ago. The problem is unchanged. The problem is controlled. Pertinent negatives include no anxiety, blurred vision, chest pain, headaches, malaise/fatigue, neck pain, orthopnea, palpitations, peripheral edema, PND, shortness of breath or sweats. There are no associated agents to hypertension. There are no known risk factors for coronary artery disease. Past treatments include angiotensin blockers and diuretics. The current treatment provides moderate improvement. There are no compliance problems.  There is no history of angina, kidney disease, CAD/MI, CVA, heart failure, left ventricular hypertrophy, PVD or retinopathy. There is no history of chronic renal disease, a hypertension causing med or renovascular disease.    Review of Systems  Constitutional: Negative.  Negative for chills, fatigue, fever, malaise/fatigue and unexpected weight change.  HENT: Negative for congestion, ear discharge, ear pain, rhinorrhea, sinus pressure, sneezing and sore throat.   Eyes: Negative for blurred vision, photophobia, pain, discharge, redness and itching.  Respiratory: Negative for cough, shortness of breath, wheezing and stridor.   Cardiovascular: Negative for chest pain, palpitations, orthopnea and PND.  Gastrointestinal: Negative for abdominal pain, blood in stool, constipation, diarrhea, nausea and vomiting.  Endocrine: Negative for cold intolerance, heat intolerance, polydipsia, polyphagia and polyuria.  Genitourinary: Negative for dysuria, flank pain, frequency, hematuria, menstrual problem, pelvic pain, urgency, vaginal bleeding and vaginal discharge.  Musculoskeletal: Negative for arthralgias, back pain, myalgias and neck pain.  Skin: Negative for rash.   Allergic/Immunologic: Negative for environmental allergies and food allergies.  Neurological: Negative for dizziness, weakness, light-headedness, numbness and headaches.  Hematological: Negative for adenopathy. Does not bruise/bleed easily.  Psychiatric/Behavioral: Negative for dysphoric mood. The patient is not nervous/anxious.     Patient Active Problem List   Diagnosis Date Noted  . Hypertension 01/21/2015  . Anxiety and depression 01/21/2015  . Back ache 06/14/2013  . Lumbar transverse process fracture ([place]) 06/14/2013    No Known Allergies  Past Surgical History:  Procedure Laterality Date  . arm fracture Left   . COLONOSCOPY  2012   normal  . CYSTOSCOPY  11/2011  . DENTAL SURGERY    . FOOT SURGERY Bilateral   . LAPAROSCOPY  04/10/1997  . LSO  1998   with LOA  . ORIF ORBITAL FRACTURE Left 01/16/2018   Procedure: OPEN REDUCTION INTERNAL FIXATION ORBITAL BLOW OUT FRACTURE;  Surgeon: Clyde Canterbury, MD;  Location: [place];  Service: ENT;  Laterality: Left;  LEIBINGER SET    WIRE MESH  . TOTAL ABDOMINAL HYSTERECTOMY  1993   with RSO  . TUBAL LIGATION  1989    Social History   Tobacco Use  . Smoking status: Never Smoker  . Smokeless tobacco: Never Used  Substance Use Topics  . Alcohol use: No    Alcohol/week: 0.0 - 1.0 standard drinks    Comment: social  . Drug use: No     Medication list has been reviewed and updated.  Current Meds  Medication Sig  . losartan-hydrochlorothiazide (HYZAAR) 100-25 MG tablet Take 1 tablet by mouth daily.  Marland Kitchen vortioxetine HBr (TRINTELLIX) 20 MG TABS tablet Take 1 tablet (20 mg total) by mouth daily. Pt is on PAP    PHQ 2/9 Scores 03/20/2019 03/05/2018 10/19/2015  PHQ - 2 Score 0 2 2  PHQ- 9 Score  0 4 -    BP Readings from Last 3 Encounters:  03/20/19 (!) 130/100  09/05/18 120/80  08/20/18 130/84    Physical Exam Vitals signs and nursing note reviewed.  Constitutional:      General: She is not in acute  distress.    Appearance: She is not diaphoretic.  HENT:     Head: Normocephalic and atraumatic.     Right Ear: Tympanic membrane, ear canal and external ear normal.     Left Ear: Tympanic membrane, ear canal and external ear normal.     Nose: Nose normal. No congestion.     Mouth/Throat:     Mouth: Mucous membranes are moist.  Eyes:     General:        Right eye: No discharge.        Left eye: No discharge.     Conjunctiva/sclera: Conjunctivae normal.     Pupils: Pupils are equal, round, and reactive to light.  Neck:     Musculoskeletal: Normal range of motion and neck supple.     Thyroid: No thyromegaly.     Vascular: No JVD.  Cardiovascular:     Rate and Rhythm: Normal rate and regular rhythm.     Heart sounds: Normal heart sounds. No murmur. No friction rub. No gallop.   Pulmonary:     Effort: Pulmonary effort is normal.     Breath sounds: Normal breath sounds. No wheezing, rhonchi or rales.  Chest:     Chest wall: No tenderness.  Abdominal:     General: Bowel sounds are normal.     Palpations: Abdomen is soft. There is no mass.     Tenderness: There is no abdominal tenderness. There is no guarding.  Musculoskeletal: Normal range of motion.  Lymphadenopathy:     Cervical: No cervical adenopathy.  Skin:    General: Skin is warm and dry.  Neurological:     Mental Status: She is alert.     Deep Tendon Reflexes: Reflexes are normal and symmetric.     Wt Readings from Last 3 Encounters:  03/20/19 199 lb (90.3 kg)  09/05/18 202 lb (91.6 kg)  08/20/18 203 lb (92.1 kg)    BP (!) 130/100   Pulse 72   Ht 5\' 3"  (1.6 m)   Wt 199 lb (90.3 kg)   BMI 35.25 kg/m   Assessment and Plan:   1. Essential hypertension Chronic.  Controlled.  Continue losartan hydrochlorothiazide 100-25 mg once a day.  Will check renal function panel. - Renal Function Panel - losartan-hydrochlorothiazide (HYZAAR) 100-25 MG tablet; Take 1 tablet by mouth daily.  Dispense: 90 tablet; Refill: 1   2. Pure hypercholesterolemia Patient has a history of elevated cholesterol and will check lipid panel. - Lipid Panel With LDL/HDL Ratio

## 2019-03-20 NOTE — Patient Instructions (Signed)

## 2019-03-21 LAB — RENAL FUNCTION PANEL
Albumin: 4.8 g/dL (ref 3.8–4.9)
BUN/Creatinine Ratio: 14 (ref 9–23)
BUN: 18 mg/dL (ref 6–24)
CO2: 27 mmol/L (ref 20–29)
Calcium: 10.3 mg/dL — ABNORMAL HIGH (ref 8.7–10.2)
Chloride: 96 mmol/L (ref 96–106)
Creatinine, Ser: 1.25 mg/dL — ABNORMAL HIGH (ref 0.57–1.00)
GFR calc Af Amer: 55 mL/min/{1.73_m2} — ABNORMAL LOW (ref >59)
GFR calc non Af Amer: 48 mL/min/{1.73_m2} — ABNORMAL LOW (ref >59)
Glucose: 93 mg/dL (ref 65–99)
Phosphorus: 3.3 mg/dL (ref 3.0–4.3)
Potassium: 5.1 mmol/L (ref 3.5–5.2)
Sodium: 141 mmol/L (ref 134–144)

## 2019-03-21 LAB — LIPID PANEL WITH LDL/HDL RATIO
Cholesterol, Total: 254 mg/dL — ABNORMAL HIGH (ref 100–199)
HDL: 49 mg/dL (ref >39)
LDL Calculated: 168 mg/dL — ABNORMAL HIGH (ref 0–99)
LDl/HDL Ratio: 3.4 ratio — ABNORMAL HIGH (ref 0.0–3.2)
Triglycerides: 185 mg/dL — ABNORMAL HIGH (ref 0–149)
VLDL Cholesterol Cal: 37 mg/dL (ref 5–40)

## 2019-05-13 ENCOUNTER — Other Ambulatory Visit: Payer: Self-pay

## 2019-05-13 ENCOUNTER — Encounter: Payer: Self-pay | Admitting: Psychiatry

## 2019-05-13 ENCOUNTER — Ambulatory Visit: Payer: Medicare Other | Admitting: Psychiatry

## 2019-05-13 ENCOUNTER — Ambulatory Visit (INDEPENDENT_AMBULATORY_CARE_PROVIDER_SITE_OTHER): Payer: Medicare Other | Admitting: Psychiatry

## 2019-05-13 DIAGNOSIS — F411 Generalized anxiety disorder: Secondary | ICD-10-CM

## 2019-05-13 DIAGNOSIS — F331 Major depressive disorder, recurrent, moderate: Secondary | ICD-10-CM | POA: Insufficient documentation

## 2019-05-13 DIAGNOSIS — F3342 Major depressive disorder, recurrent, in full remission: Secondary | ICD-10-CM

## 2019-05-13 NOTE — Progress Notes (Signed)
[facility] MD OP Progress Note  I connected with  Barbara Durham on 05/13/19 by phone and verified that I am speaking with the correct person using two identifiers.   I discussed the limitations of evaluation and management by phone. The patient expressed understanding and agreed to proceed.   05/13/2019 11:19 AM Barbara Durham  MRN:  [id number]  Chief Complaint:  " I am doing okay."  HPI: Pt reported doing fine with Trintellix. She denied any issues or concerns. She did report that she is still unable to sleep well at night but is able to make up for that by sleeping in late or during the daytime. She is able to take care of her daily routine and chores.  She informed that she is going to fill out the application of patients support program to get refills in the future.  Visit Diagnosis: Recurrent major depressive disorder, in full remission ([place])  Generalized anxiety disorder   Past Psychiatric History: MDD, GAD  Past Medical History:  Past Medical History:  Diagnosis Date  . Anxiety   . Depression   . Endometriosis   . GERD (gastroesophageal reflux disease)   . Hemorrhoids   . Hypertension   . Nocturia    enuresis  . Osteopenia 10/2009, 6/20   in spine and hip  . PONV (postoperative nausea and vomiting)   . Stress incontinence, female     Past Surgical History:  Procedure Laterality Date  . arm fracture Left   . COLONOSCOPY  2012   normal  . CYSTOSCOPY  11/2011  . DENTAL SURGERY    . FOOT SURGERY Bilateral   . LAPAROSCOPY  04/10/1997  . LSO  1998   with LOA  . ORIF ORBITAL FRACTURE Left 01/16/2018   Procedure: OPEN REDUCTION INTERNAL FIXATION ORBITAL BLOW OUT FRACTURE;  Surgeon: Clyde Canterbury, MD;  Location: [facility];  Service: ENT;  Laterality: Left;  LEIBINGER SET    WIRE MESH  . TOTAL ABDOMINAL HYSTERECTOMY  1993   with RSO  . TUBAL LIGATION  1989    Family Psychiatric History: depression- siblings  Family History:  Family History   Problem Relation Age of Onset  . Heart disease Mother   . Hypertension Mother   . Dementia Mother   . Depression Mother   . Diabetes Mother        type 2  . Alzheimer's disease Father   . Stroke Father   . CVA Father   . Prostate cancer Father   . Heart disease Sister   . Osteoporosis Sister   . Arthritis Brother   . Depression Brother   . Depression Brother   . Gout Brother   . Hypertension Brother   . Breast cancer Paternal Aunt        5s    Social History:  Social History   Socioeconomic History  . Marital status: Married    Spouse name: Not on file  . Number of children: Not on file  . Years of education: Not on file  . Highest education level: Not on file  Occupational History  . Not on file  Social Needs  . Financial resource strain: Not on file  . Food insecurity    Worry: Not on file    Inability: Not on file  . Transportation needs    Medical: Not on file    Non-medical: Not on file  Tobacco Use  . Smoking status: Never Smoker  . Smokeless tobacco: Never Used  Substance and Sexual Activity  . Alcohol use: No    Alcohol/week: 0.0 - 1.0 standard drinks    Comment: social  . Drug use: No  . Sexual activity: Not Currently    Birth control/protection: None  Lifestyle  . Physical activity    Days per week: Not on file    Minutes per session: Not on file  . Stress: Not on file  Relationships  . Social Herbalist on phone: Not on file    Gets together: Not on file    Attends religious service: Not on file    Active member of club or organization: Not on file    Attends meetings of clubs or organizations: Not on file    Relationship status: Not on file  Other Topics Concern  . Not on file  Social History Narrative  . Not on file    Allergies: No Known Allergies  Metabolic Disorder Labs: No results found for: HGBA1C, MPG No results found for: PROLACTIN Lab Results  Component Value Date   CHOL 254 (H) 03/20/2019   TRIG 185 (H)  03/20/2019   HDL 49 03/20/2019   CHOLHDL 4.2 05/15/2017   LDLCALC 168 (H) 03/20/2019   LDLCALC 146 (H) 05/15/2017   No results found for: TSH  Therapeutic Level Labs: No results found for: LITHIUM No results found for: VALPROATE No components found for:  CBMZ  Current Medications: Current Outpatient Medications  Medication Sig Dispense Refill  . albuterol (PROVENTIL HFA;VENTOLIN HFA) 108 (90 Base) MCG/ACT inhaler Inhale 2 puffs into the lungs every 6 (six) hours as needed for wheezing or shortness of breath. (Patient not taking: Reported on 03/20/2019) 1 Inhaler 2  . losartan-hydrochlorothiazide (HYZAAR) 100-25 MG tablet Take 1 tablet by mouth daily. 90 tablet 1  . Olopatadine HCl 0.2 % SOLN Apply 1 drop to eye every morning. (Patient not taking: Reported on 03/20/2019) 2.5 mL 0  . vortioxetine HBr (TRINTELLIX) 20 MG TABS tablet Take 1 tablet (20 mg total) by mouth daily. Pt is on PAP 90 tablet 3   No current facility-administered medications for this visit.     Musculoskeletal: Strength & Muscle Tone: unable to assess due to telemed visit Gait & Station: unable to assess due to telemed visit Patient leans: unable to assess due to telemed visit  Psychiatric Specialty Exam: ROS  There were no vitals taken for this visit.There is no height or weight on file to calculate BMI.  General Appearance: Unable to assess due to phone visit  Eye Contact:  Unable to assess due to phone visit  Speech:  Clear and Coherent and Normal Rate  Volume:  Normal  Mood:  Euthymic  Affect:  Congruent  Thought Process:  Goal Directed, Linear and Descriptions of Associations: Intact  Orientation:  Full (Time, Place, and Person)  Thought Content: Logical   Suicidal Thoughts:  No  Homicidal Thoughts:  No  Memory:  Recent;   Good Remote;   Good  Judgement:  Fair  Insight:  Fair  Psychomotor Activity:  Normal  Concentration:  Concentration: Good and Attention Span: Good  Recall:  Good  Fund of  Knowledge: Good  Language: Good  Akathisia:  Negative  Handed:  Right  Assets:  Communication Skills Desire for Improvement Financial Resources/Insurance Housing Social Support Transportation  ADL's:  Intact  Cognition: WNL  Sleep:  Fair   Screenings: PHQ2-9     Office Visit from 03/20/2019 in [facility] Office Visit from 03/05/2018 in  [facility] Office Visit from 10/19/2015 in [facility]  PHQ-2 Total Score  0  2  2  PHQ-9 Total Score  0  4  -       Assessment and Plan: 58 y/o female with hx of MDD and GAD now doing well on Trintellix 20 mg daily. Continue the same regimen. F/up in 3 months.     Nevada Crane, MD 05/13/2019, 11:19 AM

## 2019-08-06 ENCOUNTER — Other Ambulatory Visit: Payer: Self-pay

## 2019-08-06 ENCOUNTER — Encounter: Payer: Self-pay | Admitting: Psychiatry

## 2019-08-06 ENCOUNTER — Ambulatory Visit (INDEPENDENT_AMBULATORY_CARE_PROVIDER_SITE_OTHER): Payer: Medicare Other | Admitting: Psychiatry

## 2019-08-06 DIAGNOSIS — F411 Generalized anxiety disorder: Secondary | ICD-10-CM | POA: Diagnosis not present

## 2019-08-06 DIAGNOSIS — F3342 Major depressive disorder, recurrent, in full remission: Secondary | ICD-10-CM

## 2019-08-06 MED ORDER — VORTIOXETINE HBR 20 MG PO TABS: 20.0000 mg | ORAL_TABLET | Freq: Every day | ORAL | 3 refills | Status: DC

## 2019-08-06 NOTE — Progress Notes (Signed)
[facility] MD OP Progress Note  I connected with  Barbara Durham on 08/06/19 by a video enabled telemedicine application and verified that I am speaking with the correct person using two identifiers.   I discussed the limitations of evaluation and management by telemedicine. The patient expressed understanding and agreed to proceed.  .   08/06/2019 8:41 AM Barbara Durham  MRN:  [id number]  Chief Complaint:  " I am doing fine."  HPI: Pt reported doing well on her Trintellix. She stated that she has started to wake up early in the morning so that she can go to bed on time. She has noticed it has helped her sleep better at night.  She reported having some anxiety during the holidays but overall had a good time with family. She denied any other issues or concerns at this time.   Visit Diagnosis: Recurrent major depressive disorder, in full remission ([place])  Generalized anxiety disorder  Past Psychiatric History: MDD, GAD  Past Medical History:  Past Medical History:  Diagnosis Date  . Anxiety   . Depression   . Endometriosis   . GERD (gastroesophageal reflux disease)   . Hemorrhoids   . Hypertension   . Nocturia    enuresis  . Osteopenia 10/2009, 6/20   in spine and hip  . PONV (postoperative nausea and vomiting)   . Stress incontinence, female     Past Surgical History:  Procedure Laterality Date  . arm fracture Left   . COLONOSCOPY  2012   normal  . CYSTOSCOPY  11/2011  . DENTAL SURGERY    . FOOT SURGERY Bilateral   . LAPAROSCOPY  04/10/1997  . LSO  1998   with LOA  . ORIF ORBITAL FRACTURE Left 01/16/2018   Procedure: OPEN REDUCTION INTERNAL FIXATION ORBITAL BLOW OUT FRACTURE;  Surgeon: Clyde Canterbury, MD;  Location: [facility];  Service: ENT;  Laterality: Left;  LEIBINGER SET    WIRE MESH  . TOTAL ABDOMINAL HYSTERECTOMY  1993   with RSO  . TUBAL LIGATION  1989    Family Psychiatric History: depression- siblings  Family History:  Family History   Problem Relation Age of Onset  . Heart disease Mother   . Hypertension Mother   . Dementia Mother   . Depression Mother   . Diabetes Mother        type 2  . Alzheimer's disease Father   . Stroke Father   . CVA Father   . Prostate cancer Father   . Heart disease Sister   . Osteoporosis Sister   . Arthritis Brother   . Depression Brother   . Depression Brother   . Gout Brother   . Hypertension Brother   . Breast cancer Paternal Aunt        39s    Social History:  Social History   Socioeconomic History  . Marital status: Married    Spouse name: Not on file  . Number of children: Not on file  . Years of education: Not on file  . Highest education level: Not on file  Occupational History  . Not on file  Tobacco Use  . Smoking status: Never Smoker  . Smokeless tobacco: Never Used  Substance and Sexual Activity  . Alcohol use: No    Alcohol/week: 0.0 - 1.0 standard drinks    Comment: social  . Drug use: No  . Sexual activity: Not Currently    Birth control/protection: None  Other Topics Concern  . Not on  file  Social History Narrative  . Not on file   Social Determinants of Health   Financial Resource Strain:   . Difficulty of Paying Living Expenses: Not on file  Food Insecurity:   . Worried About Charity fundraiser in the Last Year: Not on file  . Ran Out of Food in the Last Year: Not on file  Transportation Needs:   . Lack of Transportation (Medical): Not on file  . Lack of Transportation (Non-Medical): Not on file  Physical Activity:   . Days of Exercise per Week: Not on file  . Minutes of Exercise per Session: Not on file  Stress:   . Feeling of Stress : Not on file  Social Connections:   . Frequency of Communication with Friends and Family: Not on file  . Frequency of Social Gatherings with Friends and Family: Not on file  . Attends Religious Services: Not on file  . Active Member of Clubs or Organizations: Not on file  . Attends Theatre manager Meetings: Not on file  . Marital Status: Not on file    Allergies: No Known Allergies  Metabolic Disorder Labs: No results found for: HGBA1C, MPG No results found for: PROLACTIN Lab Results  Component Value Date   CHOL 254 (H) 03/20/2019   TRIG 185 (H) 03/20/2019   HDL 49 03/20/2019   CHOLHDL 4.2 05/15/2017   LDLCALC 168 (H) 03/20/2019   LDLCALC 146 (H) 05/15/2017   No results found for: TSH  Therapeutic Level Labs: No results found for: LITHIUM No results found for: VALPROATE No components found for:  CBMZ  Current Medications: Current Outpatient Medications  Medication Sig Dispense Refill  . albuterol (PROVENTIL HFA;VENTOLIN HFA) 108 (90 Base) MCG/ACT inhaler Inhale 2 puffs into the lungs every 6 (six) hours as needed for wheezing or shortness of breath. (Patient not taking: Reported on 03/20/2019) 1 Inhaler 2  . losartan-hydrochlorothiazide (HYZAAR) 100-25 MG tablet Take 1 tablet by mouth daily. 90 tablet 1  . Olopatadine HCl 0.2 % SOLN Apply 1 drop to eye every morning. (Patient not taking: Reported on 03/20/2019) 2.5 mL 0  . vortioxetine HBr (TRINTELLIX) 20 MG TABS tablet Take 1 tablet (20 mg total) by mouth daily. Pt is on PAP 90 tablet 3   No current facility-administered medications for this visit.    Musculoskeletal: Strength & Muscle Tone: unable to assess due to telemed visit Gait & Station: unable to assess due to telemed visit Patient leans: unable to assess due to telemed visit  Psychiatric Specialty Exam: ROS  There were no vitals taken for this visit.There is no height or weight on file to calculate BMI.  General Appearance: Well groomed  Eye Contact:  Unable to assess due to phone visit  Speech:  Clear and Coherent and Normal Rate  Volume:  Normal  Mood:  Euthymic  Affect:  Congruent  Thought Process:  Goal Directed, Linear and Descriptions of Associations: Intact  Orientation:  Full (Time, Place, and Person)  Thought Content: Logical    Suicidal Thoughts:  No  Homicidal Thoughts:  No  Memory:  Recent;   Good Remote;   Good  Judgement:  Fair  Insight:  Fair  Psychomotor Activity:  Normal  Concentration:  Concentration: Good and Attention Span: Good  Recall:  Good  Fund of Knowledge: Good  Language: Good  Akathisia:  Negative  Handed:  Right  Assets:  Communication Skills Desire for Improvement Financial Resources/Insurance Housing Social Support Transportation  ADL's:  Intact  Cognition: WNL  Sleep:  Fair   Screenings: PHQ2-9     Office Visit from 03/20/2019 in [facility] Office Visit from 03/05/2018 in [facility] Office Visit from 10/19/2015 in [facility]  PHQ-2 Total Score  0  2  2  PHQ-9 Total Score  0  4  --       Assessment and Plan: 59 y/o female with hx of MDD and GAD now doing well on Trintellix 20 mg daily.  1. Recurrent major depressive disorder, in full remission ([place]) -Continue Trintellix 20 mg daily.  2. Generalized anxiety disorder -Continue Trintellix 20 mg daily.  Continue the same regimen. F/up in 3 months.     [place]ev[place] Crane, MD 08/06/2019, 8:41 AM

## 2019-09-13 ENCOUNTER — Other Ambulatory Visit: Payer: Self-pay

## 2019-09-13 DIAGNOSIS — I10 Essential (primary) hypertension: Secondary | ICD-10-CM

## 2019-09-13 MED ORDER — LOSARTAN POTASSIUM-HCTZ 100-25 MG PO TABS: 1.0000 | ORAL_TABLET | Freq: Every day | ORAL | 0 refills | Status: DC

## 2019-09-13 NOTE — Progress Notes (Unsigned)
Sent in prescription for B/P med

## 2019-09-23 ENCOUNTER — Other Ambulatory Visit: Payer: Self-pay

## 2019-09-23 ENCOUNTER — Encounter: Payer: Self-pay | Admitting: Family Medicine

## 2019-09-23 ENCOUNTER — Ambulatory Visit (INDEPENDENT_AMBULATORY_CARE_PROVIDER_SITE_OTHER): Payer: Medicare Other | Admitting: Family Medicine

## 2019-09-23 VITALS — BP 120/68 | HR 76 | Ht 63.0 in | Wt 205.0 lb

## 2019-09-23 DIAGNOSIS — E782 Mixed hyperlipidemia: Secondary | ICD-10-CM | POA: Diagnosis not present

## 2019-09-23 DIAGNOSIS — I10 Essential (primary) hypertension: Secondary | ICD-10-CM

## 2019-09-23 DIAGNOSIS — Z23 Encounter for immunization: Secondary | ICD-10-CM | POA: Diagnosis not present

## 2019-09-23 MED ORDER — LOSARTAN POTASSIUM-HCTZ 100-25 MG PO TABS: 1.0000 | ORAL_TABLET | Freq: Every day | ORAL | 1 refills | Status: DC

## 2019-09-23 NOTE — Patient Instructions (Signed)

## 2019-09-23 NOTE — Addendum Note (Signed)
Addended by: Fredderick Severance on: 09/23/2019 12:16 PM   Modules accepted: Orders

## 2019-09-23 NOTE — Progress Notes (Signed)
Date:  09/23/2019   Name:  Barbara Durham   DOB:  Nov 21, 1960   MRN:  [id number]   Chief Complaint: Hypertension, Hyperlipidemia (elevated in August- needs recheck), and Flu Vaccine  Hypertension This is a chronic problem. The current episode started more than 1 year ago. The problem has been gradually improving since onset. The problem is controlled. Pertinent negatives include no anxiety, blurred vision, chest pain, headaches, malaise/fatigue, neck pain, orthopnea, palpitations, peripheral edema, PND, shortness of breath or sweats. There are no associated agents to hypertension. There are no known risk factors for coronary artery disease. Past treatments include alpha 1 blockers and diuretics. The current treatment provides moderate improvement. There are no compliance problems.  There is no history of angina, kidney disease, CAD/MI, CVA, heart failure, left ventricular hypertrophy, PVD or retinopathy. There is no history of chronic renal disease, a hypertension causing med or renovascular disease.  Hyperlipidemia This is a chronic problem. The current episode started more than 1 year ago. The problem is controlled. Recent lipid tests were reviewed and are normal. She has no history of chronic renal disease, diabetes, hypothyroidism, liver disease or obesity. There are no known factors aggravating her hyperlipidemia. Pertinent negatives include no chest pain, focal sensory loss, focal weakness, leg pain, myalgias or shortness of breath. Current antihyperlipidemic treatment includes statins. The current treatment provides moderate improvement of lipids. There are no compliance problems.  Risk factors for coronary artery disease include hypertension.    Lab Results  Component Value Date   CREATININE 1.25 (H) 03/20/2019   BUN 18 03/20/2019   NA 141 03/20/2019   K 5.1 03/20/2019   CL 96 03/20/2019   CO2 27 03/20/2019   Lab Results  Component Value Date   CHOL 254 (H) 03/20/2019   HDL 49  03/20/2019   LDLCALC 168 (H) 03/20/2019   TRIG 185 (H) 03/20/2019   CHOLHDL 4.2 05/15/2017   No results found for: TSH No results found for: HGBA1C   Review of Systems  Constitutional: Negative.  Negative for chills, fatigue, fever, malaise/fatigue and unexpected weight change.  HENT: Negative for congestion, ear discharge, ear pain, nosebleeds, postnasal drip, rhinorrhea, sinus pressure, sneezing and sore throat.   Eyes: Negative for blurred vision, photophobia, pain, discharge, redness and itching.  Respiratory: Negative for cough, shortness of breath, wheezing and stridor.   Cardiovascular: Negative for chest pain, palpitations, orthopnea and PND.  Gastrointestinal: Negative for abdominal pain, blood in stool, constipation, diarrhea, nausea and vomiting.  Endocrine: Negative for cold intolerance, heat intolerance, polydipsia, polyphagia and polyuria.  Genitourinary: Negative for dysuria, flank pain, frequency, hematuria, menstrual problem, pelvic pain, urgency, vaginal bleeding and vaginal discharge.  Musculoskeletal: Negative for arthralgias, back pain, myalgias and neck pain.  Skin: Negative for rash.  Allergic/Immunologic: Negative for environmental allergies and food allergies.  Neurological: Negative for dizziness, focal weakness, weakness, light-headedness, numbness and headaches.  Hematological: Negative for adenopathy. Does not bruise/bleed easily.  Psychiatric/Behavioral: Negative for dysphoric mood. The patient is not nervous/anxious.     Patient Active Problem List   Diagnosis Date Noted  . Recurrent major depressive disorder, in full remission ([place]) 08/06/2019  . MDD (major depressive disorder), recurrent episode, moderate ([place]) 05/13/2019  . Generalized anxiety disorder 05/13/2019  . Hypertension 01/21/2015  . Anxiety and depression 01/21/2015  . Back ache 06/14/2013  . Lumbar transverse process fracture ([place]) 06/14/2013    No Known Allergies  Past Surgical  History:  Procedure Laterality Date  . arm fracture Left   .  COLONOSCOPY  2012   normal  . CYSTOSCOPY  11/2011  . DENTAL SURGERY    . FOOT SURGERY Bilateral   . LAPAROSCOPY  04/10/1997  . LSO  1998   with LOA  . ORIF ORBITAL FRACTURE Left 01/16/2018   Procedure: OPEN REDUCTION INTERNAL FIXATION ORBITAL BLOW OUT FRACTURE;  Surgeon: Clyde Canterbury, MD;  Location: [place];  Service: ENT;  Laterality: Left;  LEIBINGER SET    WIRE MESH  . TOTAL ABDOMINAL HYSTERECTOMY  1993   with RSO  . TUBAL LIGATION  1989    Social History   Tobacco Use  . Smoking status: Never Smoker  . Smokeless tobacco: Never Used  Substance Use Topics  . Alcohol use: No    Alcohol/week: 0.0 - 1.0 standard drinks    Comment: social  . Drug use: No     Medication list has been reviewed and updated.  Current Meds  Medication Sig  . albuterol (PROVENTIL HFA;VENTOLIN HFA) 108 (90 Base) MCG/ACT inhaler Inhale 2 puffs into the lungs every 6 (six) hours as needed for wheezing or shortness of breath.  . losartan-hydrochlorothiazide (HYZAAR) 100-25 MG tablet Take 1 tablet by mouth daily.  Marland Kitchen vortioxetine HBr (TRINTELLIX) 20 MG TABS tablet Take 1 tablet (20 mg total) by mouth daily. Pt is on PAP    PHQ 2/9 Scores 09/23/2019 03/20/2019 03/05/2018 10/19/2015  PHQ - 2 Score 0 0 2 2  PHQ- 9 Score 2 0 4 -    BP Readings from Last 3 Encounters:  09/23/19 120/68  03/20/19 (!) 130/100  09/05/18 120/80    Physical Exam Vitals and nursing note reviewed.  Constitutional:      General: She is not in acute distress.    Appearance: She is not diaphoretic.  HENT:     Head: Normocephalic and atraumatic.     Right Ear: Tympanic membrane, ear canal and external ear normal.     Left Ear: Tympanic membrane, ear canal and external ear normal.     Nose: Nose normal.     Mouth/Throat:     Mouth: Mucous membranes are moist.  Eyes:     General:        Right eye: No discharge.        Left eye: No discharge.      Conjunctiva/sclera: Conjunctivae normal.     Pupils: Pupils are equal, round, and reactive to light.  Neck:     Thyroid: No thyromegaly.     Vascular: No JVD.  Cardiovascular:     Rate and Rhythm: Normal rate and regular rhythm.     Heart sounds: Normal heart sounds. No murmur. No friction rub. No gallop.   Pulmonary:     Effort: Pulmonary effort is normal.     Breath sounds: Normal breath sounds. No wheezing, rhonchi or rales.  Abdominal:     General: Bowel sounds are normal.     Palpations: Abdomen is soft. There is no mass.     Tenderness: There is no abdominal tenderness. There is no guarding.  Musculoskeletal:        General: Normal range of motion.     Cervical back: Normal range of motion and neck supple.  Lymphadenopathy:     Cervical: No cervical adenopathy.  Skin:    General: Skin is warm and dry.     Capillary Refill: Capillary refill takes less than 2 seconds.  Neurological:     Mental Status: She is alert.     Deep Tendon  Reflexes: Reflexes are normal and symmetric.     Wt Readings from Last 3 Encounters:  09/23/19 205 lb (93 kg)  03/20/19 199 lb (90.3 kg)  09/05/18 202 lb (91.6 kg)    BP 120/68   Pulse 76   Ht 5\' 3"  (1.6 m)   Wt 205 lb (93 kg)   BMI 36.31 kg/m   Assessment and Plan:  1. Essential hypertension Chronic.  Controlled.  Stable.  Patient is doing well on current dosing of losartan hydrochlorothiazide 100-25 mg daily.  Will check renal function panel to evaluate GFR. - Renal Function Panel - losartan-hydrochlorothiazide (HYZAAR) 100-25 MG tablet; Take 1 tablet by mouth daily.  Dispense: 90 tablet; Refill: 1  2. Mixed hyperlipidemia Chronic.  Controlled.  Stable.  Does have some elevation of the triglycerides and LDL.  Weight loss is been discussed and patient was given a Mediterranean diet guidelines.  We will check a lipid panel and adjust may be accordingly. - Lipid Panel With LDL/HDL Ratio

## 2019-09-24 LAB — LIPID PANEL WITH LDL/HDL RATIO
Cholesterol, Total: 219 mg/dL — ABNORMAL HIGH (ref 100–199)
HDL: 51 mg/dL (ref >39)
LDL Chol Calc (NIH): 139 mg/dL — ABNORMAL HIGH (ref 0–99)
LDL/HDL Ratio: 2.7 ratio (ref 0.0–3.2)
Triglycerides: 160 mg/dL — ABNORMAL HIGH (ref 0–149)
VLDL Cholesterol Cal: 29 mg/dL (ref 5–40)

## 2019-09-24 LAB — RENAL FUNCTION PANEL
Albumin: 4.3 g/dL (ref 3.8–4.9)
BUN/Creatinine Ratio: 14 (ref 9–23)
BUN: 17 mg/dL (ref 6–24)
CO2: 22 mmol/L (ref 20–29)
Calcium: 10 mg/dL (ref 8.7–10.2)
Chloride: 99 mmol/L (ref 96–106)
Creatinine, Ser: 1.25 mg/dL — ABNORMAL HIGH (ref 0.57–1.00)
GFR calc Af Amer: 54 mL/min/{1.73_m2} — ABNORMAL LOW (ref >59)
GFR calc non Af Amer: 47 mL/min/{1.73_m2} — ABNORMAL LOW (ref >59)
Glucose: 93 mg/dL (ref 65–99)
Phosphorus: 4.9 mg/dL — ABNORMAL HIGH (ref 3.0–4.3)
Potassium: 3.8 mmol/L (ref 3.5–5.2)
Sodium: 140 mmol/L (ref 134–144)

## 2019-11-01 ENCOUNTER — Encounter: Payer: Self-pay | Admitting: Psychiatry

## 2019-11-01 ENCOUNTER — Ambulatory Visit (INDEPENDENT_AMBULATORY_CARE_PROVIDER_SITE_OTHER): Payer: Medicare Other | Admitting: Psychiatry

## 2019-11-01 ENCOUNTER — Other Ambulatory Visit: Payer: Self-pay

## 2019-11-01 DIAGNOSIS — F3342 Major depressive disorder, recurrent, in full remission: Secondary | ICD-10-CM

## 2019-11-01 DIAGNOSIS — F411 Generalized anxiety disorder: Secondary | ICD-10-CM | POA: Diagnosis not present

## 2019-11-01 MED ORDER — VORTIOXETINE HBR 20 MG PO TABS: 20.0000 mg | ORAL_TABLET | Freq: Every day | ORAL | 3 refills | Status: DC

## 2019-11-01 NOTE — Progress Notes (Signed)
[facility] MD OP Progress Note  I connected with  Barbara Durham on 11/01/19 by a video enabled telemedicine application and verified that I am speaking with the correct person using two identifiers.   I discussed the limitations of evaluation and management by telemedicine. The patient expressed understanding and agreed to proceed.  .   11/01/2019 8:37 AM Barbara Durham  MRN:  [id number]  Chief Complaint:  " I am doing good."  HPI: Pt reported that she has been doing well for the most part.  She informed that she had appeared of 1 to 1-1/2 weeks when she felt very low in energy and did not feel like getting out of her bed.  She felt like blah and did not feel like doing anything.  She stated that now she is coming out of that phase.  She stated that thankfully she did not have any panic attacks and other than that face she has done well.  She stated she believes that she can continue taking the medicine the way it is. She denied any acute issues or concerns or stressors at this time.  Visit Diagnosis: Recurrent major depressive disorder, in full remission ([place])  Generalized anxiety disorder  Past Psychiatric History: MDD, GAD  Past Medical History:  Past Medical History:  Diagnosis Date  . Anxiety   . Depression   . Endometriosis   . GERD (gastroesophageal reflux disease)   . Hemorrhoids   . Hypertension   . Nocturia    enuresis  . Osteopenia 10/2009, 6/20   in spine and hip  . PONV (postoperative nausea and vomiting)   . Stress incontinence, female     Past Surgical History:  Procedure Laterality Date  . arm fracture Left   . COLONOSCOPY  2012   normal  . CYSTOSCOPY  11/2011  . DENTAL SURGERY    . FOOT SURGERY Bilateral   . LAPAROSCOPY  04/10/1997  . LSO  1998   with LOA  . ORIF ORBITAL FRACTURE Left 01/16/2018   Procedure: OPEN REDUCTION INTERNAL FIXATION ORBITAL BLOW OUT FRACTURE;  Surgeon: Clyde Canterbury, MD;  Location: [facility];  Service: ENT;   Laterality: Left;  LEIBINGER SET    WIRE MESH  . TOTAL ABDOMINAL HYSTERECTOMY  1993   with RSO  . TUBAL LIGATION  1989    Family Psychiatric History: depression- siblings  Family History:  Family History  Problem Relation Age of Onset  . Heart disease Mother   . Hypertension Mother   . Dementia Mother   . Depression Mother   . Diabetes Mother        type 2  . Alzheimer's disease Father   . Stroke Father   . CVA Father   . Prostate cancer Father   . Heart disease Sister   . Osteoporosis Sister   . Arthritis Brother   . Depression Brother   . Depression Brother   . Gout Brother   . Hypertension Brother   . Breast cancer Paternal Aunt        53s    Social History:  Social History   Socioeconomic History  . Marital status: Married    Spouse name: Not on file  . Number of children: Not on file  . Years of education: Not on file  . Highest education level: Not on file  Occupational History  . Not on file  Tobacco Use  . Smoking status: Never Smoker  . Smokeless tobacco: Never Used  Substance and  Sexual Activity  . Alcohol use: No    Alcohol/week: 0.0 - 1.0 standard drinks    Comment: social  . Drug use: No  . Sexual activity: Not Currently    Birth control/protection: None  Other Topics Concern  . Not on file  Social History Narrative  . Not on file   Social Determinants of Health   Financial Resource Strain:   . Difficulty of Paying Living Expenses:   Food Insecurity:   . Worried About Charity fundraiser in the Last Year:   . Arboriculturist in the Last Year:   Transportation Needs:   . Film/video editor (Medical):   Marland Kitchen Lack of Transportation (Non-Medical):   Physical Activity:   . Days of Exercise per Week:   . Minutes of Exercise per Session:   Stress:   . Feeling of Stress :   Social Connections:   . Frequency of Communication with Friends and Family:   . Frequency of Social Gatherings with Friends and Family:   . Attends Religious  Services:   . Active Member of Clubs or Organizations:   . Attends Archivist Meetings:   Marland Kitchen Marital Status:     Allergies: No Known Allergies  Metabolic Disorder Labs: No results found for: HGBA1C, MPG No results found for: PROLACTIN Lab Results  Component Value Date   CHOL 219 (H) 09/23/2019   TRIG 160 (H) 09/23/2019   HDL 51 09/23/2019   CHOLHDL 4.2 05/15/2017   LDLCALC 139 (H) 09/23/2019   LDLCALC 168 (H) 03/20/2019   No results found for: TSH  Therapeutic Level Labs: No results found for: LITHIUM No results found for: VALPROATE No components found for:  CBMZ  Current Medications: Current Outpatient Medications  Medication Sig Dispense Refill  . albuterol (PROVENTIL HFA;VENTOLIN HFA) 108 (90 Base) MCG/ACT inhaler Inhale 2 puffs into the lungs every 6 (six) hours as needed for wheezing or shortness of breath. 1 Inhaler 2  . losartan-hydrochlorothiazide (HYZAAR) 100-25 MG tablet Take 1 tablet by mouth daily. 90 tablet 1  . vortioxetine HBr (TRINTELLIX) 20 MG TABS tablet Take 1 tablet (20 mg total) by mouth daily. Pt is on PAP 90 tablet 3   No current facility-administered medications for this visit.    Musculoskeletal: Strength & Muscle Tone: unable to assess due to telemed visit Gait & Station: unable to assess due to telemed visit Patient leans: unable to assess due to telemed visit  Psychiatric Specialty Exam: ROS  There were no vitals taken for this visit.There is no height or weight on file to calculate BMI.  General Appearance: Well groomed  Eye Contact:  Good  Speech:  Clear and Coherent and Normal Rate  Volume:  Normal  Mood:  Euthymic  Affect:  Congruent  Thought Process:  Goal Directed, Linear and Descriptions of Associations: Intact  Orientation:  Full (Time, Place, and Person)  Thought Content: Logical   Suicidal Thoughts:  No  Homicidal Thoughts:  No  Memory:  Recent;   Good Remote;   Good  Judgement:  Fair  Insight:  Fair   Psychomotor Activity:  Normal  Concentration:  Concentration: Good and Attention Span: Good  Recall:  Good  Fund of Knowledge: Good  Language: Good  Akathisia:  Negative  Handed:  Right  Assets:  Communication Skills Desire for Improvement Financial Resources/Insurance Housing Social Support Transportation  ADL's:  Intact  Cognition: WNL  Sleep:  Fair   Screenings: GAD-7     [facility]  Visit [facility] 09/23/2019 in [facility]  Total GAD-7 Score  0    PHQ2-9     [facility] Visit from 09/23/2019 in [facility] Office Visit from 03/20/2019 in [facility] Office Visit from 03/05/2018 in [facility] Office Visit from 10/19/2015 in [facility]  PHQ-2 Total Score  0  0  2  2  PHQ-9 Total Score  2  0  4  --       Assessment and Plan: 59 y/o female with hx of MDD and GAD now doing well on Trintellix 20 mg daily.  1. Recurrent major depressive disorder, in full remission ([place]) -Continue Trintellix 20 mg daily.  2. Generalized anxiety disorder -Continue Trintellix 20 mg daily.  Continue the same regimen. F/up in 3 months.     Nevada Crane, MD 11/01/2019, 8:37 AM

## 2019-12-10 ENCOUNTER — Telehealth: Payer: Self-pay | Admitting: Family Medicine

## 2019-12-10 NOTE — Telephone Encounter (Signed)
Left message for patient to call back and schedule Medicare Annual Wellness Visit (AWV) either virtually/audio only or in office. Whichever the patients preference is.  No history of AWV; please schedule at anytime with [facility] Health Advisor.  Eligible per [employer] as of 12/23/2013

## 2020-01-21 ENCOUNTER — Encounter (HOSPITAL_COMMUNITY): Payer: Self-pay | Admitting: Psychiatry

## 2020-01-21 ENCOUNTER — Telehealth: Payer: Medicare Other | Admitting: Psychiatry

## 2020-01-21 ENCOUNTER — Telehealth (INDEPENDENT_AMBULATORY_CARE_PROVIDER_SITE_OTHER): Payer: Medicare Other | Admitting: Psychiatry

## 2020-01-21 ENCOUNTER — Other Ambulatory Visit: Payer: Self-pay

## 2020-01-21 DIAGNOSIS — F3342 Major depressive disorder, recurrent, in full remission: Secondary | ICD-10-CM | POA: Diagnosis not present

## 2020-01-21 DIAGNOSIS — F411 Generalized anxiety disorder: Secondary | ICD-10-CM

## 2020-01-21 MED ORDER — TEMAZEPAM 15 MG PO CAPS: 15.0000 mg | ORAL_CAPSULE | Freq: Every evening | ORAL | 1 refills | Status: DC | PRN

## 2020-01-21 NOTE — Progress Notes (Signed)
[facility] MD OP Progress Note  Virtual Visit via Video Note  I connected with Barbara Durham on 01/21/20 at  8:30 AM EDT by a video enabled telemedicine application and verified that I am speaking with the correct person using two identifiers.  Location: Patient: Home Provider: Clinic   I discussed the limitations of evaluation and management by telemedicine and the availability of in person appointments. The patient expressed understanding and agreed to proceed.  I provided 17 minutes of non-face-to-face time during this encounter.     01/21/2020 8:47 AM Barbara Durham  MRN:  [id number]  Chief Complaint:  " I am sleeping almost half of the day and not sleeping during the night."  HPI: Patient reported that she is unable to sleep at night and as result she feels tired during the day and she sleeps about half the day.  She stated that because she does not wake up in a timely fashion in the morning she is unable to complete most of the things she needs to do during the day.  She stated that she stays at home and hardly goes out.  She informed that she is not very anxious however she feels a bit depressed at times. Regarding sleep, she stated that she has tried several different medications in the past and nothing ever helped.  EMR reviewed and was noted that she is taking Ambien, trazodone, Xanax, Seroquel for sleep in the past.  She informed that she developed significant restlessness in legs after taking a few of these medications and she is scared to try anything new for sleeping. Patient was offered temazepam to help her with sleep. Potential side effects of medication and risks vs benefits of treatment vs non-treatment were explained and discussed. All questions were answered. Patient was agreeable to try it.   Visit Diagnosis: Recurrent major depressive disorder, in full remission ([place])  Generalized anxiety disorder  Past Psychiatric History: MDD, GAD  Past Medical History:   Past Medical History:  Diagnosis Date  . Anxiety   . Depression   . Endometriosis   . GERD (gastroesophageal reflux disease)   . Hemorrhoids   . Hypertension   . Nocturia    enuresis  . Osteopenia 10/2009, 6/20   in spine and hip  . PONV (postoperative nausea and vomiting)   . Stress incontinence, female     Past Surgical History:  Procedure Laterality Date  . arm fracture Left   . COLONOSCOPY  2012   normal  . CYSTOSCOPY  11/2011  . DENTAL SURGERY    . FOOT SURGERY Bilateral   . LAPAROSCOPY  04/10/1997  . LSO  1998   with LOA  . ORIF ORBITAL FRACTURE Left 01/16/2018   Procedure: OPEN REDUCTION INTERNAL FIXATION ORBITAL BLOW OUT FRACTURE;  Surgeon: Clyde Canterbury, MD;  Location: [facility];  Service: ENT;  Laterality: Left;  LEIBINGER SET    WIRE MESH  . TOTAL ABDOMINAL HYSTERECTOMY  1993   with RSO  . TUBAL LIGATION  1989    Family Psychiatric History: depression- siblings  Family History:  Family History  Problem Relation Age of Onset  . Heart disease Mother   . Hypertension Mother   . Dementia Mother   . Depression Mother   . Diabetes Mother        type 2  . Alzheimer's disease Father   . Stroke Father   . CVA Father   . Prostate cancer Father   . Heart disease Sister   .  Osteoporosis Sister   . Arthritis Brother   . Depression Brother   . Depression Brother   . Gout Brother   . Hypertension Brother   . Breast cancer Paternal Aunt        50s    Social History:  Social History   Socioeconomic History  . Marital status: Married    Spouse name: Not on file  . Number of children: Not on file  . Years of education: Not on file  . Highest education level: Not on file  Occupational History  . Not on file  Tobacco Use  . Smoking status: Never Smoker  . Smokeless tobacco: Never Used  Vaping Use  . Vaping Use: Never used  Substance and Sexual Activity  . Alcohol use: No    Alcohol/week: 0.0 - 1.0 standard drinks    Comment: social  .  Drug use: No  . Sexual activity: Not Currently    Birth control/protection: None  Other Topics Concern  . Not on file  Social History Narrative  . Not on file   Social Determinants of Health   Financial Resource Strain:   . Difficulty of Paying Living Expenses:   Food Insecurity:   . Worried About Charity fundraiser in the Last Year:   . Arboriculturist in the Last Year:   Transportation Needs:   . Film/video editor (Medical):   Marland Kitchen Lack of Transportation (Non-Medical):   Physical Activity:   . Days of Exercise per Week:   . Minutes of Exercise per Session:   Stress:   . Feeling of Stress :   Social Connections:   . Frequency of Communication with Friends and Family:   . Frequency of Social Gatherings with Friends and Family:   . Attends Religious Services:   . Active Member of Clubs or Organizations:   . Attends Archivist Meetings:   Marland Kitchen Marital Status:     Allergies: No Known Allergies  Metabolic Disorder Labs: No results found for: HGBA1C, MPG No results found for: PROLACTIN Lab Results  Component Value Date   CHOL 219 (H) 09/23/2019   TRIG 160 (H) 09/23/2019   HDL 51 09/23/2019   CHOLHDL 4.2 05/15/2017   LDLCALC 139 (H) 09/23/2019   LDLCALC 168 (H) 03/20/2019   No results found for: TSH  Therapeutic Level Labs: No results found for: LITHIUM No results found for: VALPROATE No components found for:  CBMZ  Current Medications: Current Outpatient Medications  Medication Sig Dispense Refill  . albuterol (PROVENTIL HFA;VENTOLIN HFA) 108 (90 Base) MCG/ACT inhaler Inhale 2 puffs into the lungs every 6 (six) hours as needed for wheezing or shortness of breath. 1 Inhaler 2  . losartan-hydrochlorothiazide (HYZAAR) 100-25 MG tablet Take 1 tablet by mouth daily. 90 tablet 1  . vortioxetine HBr (TRINTELLIX) 20 MG TABS tablet Take 1 tablet (20 mg total) by mouth daily. Pt is on PAP 90 tablet 3   No current facility-administered medications for this visit.     Musculoskeletal: Strength & Muscle Tone: unable to assess due to telemed visit Gait & Station: unable to assess due to telemed visit Patient leans: unable to assess due to telemed visit  Psychiatric Specialty Exam: ROS  There were no vitals taken for this visit.There is no height or weight on file to calculate BMI.  General Appearance: Fairly groomed  Eye Contact:  Good  Speech:  Clear and Coherent and Normal Rate  Volume:  Normal  Mood:  Euthymic  Affect:  Congruent  Thought Process:  Goal Directed, Linear and Descriptions of Associations: Intact  Orientation:  Full (Time, Place, and Person)  Thought Content: Logical   Suicidal Thoughts:  No  Homicidal Thoughts:  No  Memory:  Recent;   Good Remote;   Good  Judgement:  Fair  Insight:  Fair  Psychomotor Activity:  Normal  Concentration:  Concentration: Good and Attention Span: Good  Recall:  Good  Fund of Knowledge: Good  Language: Good  Akathisia:  Negative  Handed:  Right  Assets:  Communication Skills Desire for Improvement Financial Resources/Insurance Housing Social Support Transportation  ADL's:  Intact  Cognition: WNL  Sleep:  Poor   Screenings: GAD-7     Office Visit from 09/23/2019 in [facility]  Total GAD-7 Score 0    PHQ2-9     Office Visit from 09/23/2019 in [facility] Office Visit from 03/20/2019 in [facility] Office Visit from 03/05/2018 in [facility] Office Visit from 10/19/2015 in [facility]  PHQ-2 Total Score 0 0 2 2  PHQ-9 Total Score 2 0 4 --       Assessment and Plan: Patient reported improvement in her anxiety symptoms however is now complaining of poor sleep and low energy levels.  Patient is not sleeping well at night and was agreeable to try temazepam for insomnia. Potential side effects of medication and risks vs benefits of treatment vs non-treatment were explained and discussed. All questions were answered.   1. Recurrent major  depressive disorder, in full remission ([place]) -Continue Trintellix 20 mg daily. - Start Temazepam 15 mg HS for sleep.  2. Generalized anxiety disorder -Continue Trintellix 20 mg daily.   F/up in 2 months.     [place]ev[place] Crane, MD 01/21/2020, 8:47 AM

## 2020-03-19 ENCOUNTER — Encounter (HOSPITAL_COMMUNITY): Payer: Self-pay | Admitting: Psychiatry

## 2020-03-19 ENCOUNTER — Telehealth (INDEPENDENT_AMBULATORY_CARE_PROVIDER_SITE_OTHER): Payer: Medicare Other | Admitting: Psychiatry

## 2020-03-19 ENCOUNTER — Other Ambulatory Visit: Payer: Self-pay

## 2020-03-19 DIAGNOSIS — F3342 Major depressive disorder, recurrent, in full remission: Secondary | ICD-10-CM | POA: Diagnosis not present

## 2020-03-19 DIAGNOSIS — F411 Generalized anxiety disorder: Secondary | ICD-10-CM | POA: Diagnosis not present

## 2020-03-19 DIAGNOSIS — F5101 Primary insomnia: Secondary | ICD-10-CM

## 2020-03-19 MED ORDER — ESZOPICLONE 2 MG PO TABS: 2.0000 mg | ORAL_TABLET | Freq: Every day | ORAL | 1 refills | Status: DC

## 2020-03-19 MED ORDER — VORTIOXETINE HBR 20 MG PO TABS: 20.0000 mg | ORAL_TABLET | Freq: Every day | ORAL | 3 refills | Status: DC

## 2020-03-19 NOTE — Progress Notes (Signed)
[facility] [facility] MD OP Progress Note  Virtual Visit via Video Note  I connected with [name] on 03/19/20 at  8:30 AM EDT by a video enabled telemedicine application and verified that I am speaking with the correct person using two identifiers.  Location: Patient: Home Provider: Clinic   I discussed the limitations of evaluation and management by telemedicine and the availability of in person appointments. The patient expressed understanding and agreed to proceed.  I provided 16 minutes of non-face-to-face time during this encounter.     03/19/2020 8:43 AM [name]  MRN:  [id number]  Chief Complaint:  " I am still not sleeping at night."  HPI: Patient reported that she still struggling with poor sleep.  She stated that she has tried taking temazepam at different times in the evening however despite that she still awake until 2 or 3 AM.  She is still feeling tired during the daytime and may take a nap during there.  She stated that she is not sure what else can help her. EMR was reviewed, it was noted that she has taken Seroquel in the past however it had to be discontinued because of significant restlessness in her legs.  She has also taken Ambien but that had to be discontinued after she started sleepwalking and had hallucinations.  She has also tried [place] which was not effective in the past. Latest is temazepam which also has been ineffective.   Visit Diagnosis: Recurrent major depressive disorder, in full remission ([place]) - Plan: vortioxetine HBr (TRINTELLIX) 20 MG TABS tablet  Generalized anxiety disorder - Plan: vortioxetine HBr (TRINTELLIX) 20 MG TABS tablet  Primary insomnia - Plan: eszopiclone (LUNESTA) 2 MG TABS tablet  Past Psychiatric History: MDD, GAD  Past Medical History:  Past Medical History:  Diagnosis Date   Anxiety    Depression    Endometriosis    GERD (gastroesophageal reflux disease)    Hemorrhoids    Hypertension    Nocturia     enuresis   Osteopenia 10/2009, 6/20   in spine and hip   PONV (postoperative nausea and vomiting)    Stress incontinence, female     Past Surgical History:  Procedure Laterality Date   arm fracture Left    COLONOSCOPY  2012   normal   CYSTOSCOPY  11/2011   DENTAL SURGERY     FOOT SURGERY Bilateral    LAPAROSCOPY  04/10/1997   LSO  1998   with LOA   ORIF ORBITAL FRACTURE Left 01/16/2018   Procedure: OPEN REDUCTION INTERNAL FIXATION ORBITAL BLOW OUT FRACTURE;  Surgeon: Clyde Canterbury, MD;  Location: [facility];  Service: ENT;  Laterality: Left;  LEIBINGER SET    [place]   with [place]    Family Psychiatric History: depression- siblings  Family History:  Family History  Problem Relation Age of Onset   Heart disease Mother    Hypertension Mother    Dementia Mother    Depression Mother    Diabetes Mother        type 2   Alzheimer's disease Father    Stroke Father    CVA Father    Prostate cancer Father    Heart disease Sister    Osteoporosis Sister    Arthritis Brother    Depression Brother    Depression Brother    Gout Brother    Hypertension Brother    Breast cancer Paternal  Aunt        79s    Social History:  Social History   Socioeconomic History   Marital status: Married    Spouse name: Not on file   Number of children: Not on file   Years of education: Not on file   Highest education level: Not on file  Occupational History   Not on file  Tobacco Use   Smoking status: Never Smoker   Smokeless tobacco: Never Used  Vaping Use   Vaping Use: Never used  Substance and Sexual Activity   Alcohol use: No    Alcohol/week: 0.0 - 1.0 standard drinks    Comment: social   Drug use: No   Sexual activity: Not Currently    Birth control/protection: None  Other Topics Concern   Not on file  Social History Narrative   Not on file   Social  Determinants of Health   Financial Resource Strain:    Difficulty of Paying Living Expenses: Not on file  Food Insecurity:    Worried About Charity fundraiser in the Last Year: Not on file   [employer] of Food in the Last Year: Not on file  Transportation Needs:    Lack of Transportation (Medical): Not on file   Lack of Transportation (Non-Medical): Not on file  Physical Activity:    Days of Exercise per Week: Not on file   Minutes of Exercise per Session: Not on file  Stress:    Feeling of Stress : Not on file  Social Connections:    Frequency of Communication with Friends and Family: Not on file   Frequency of Social Gatherings with Friends and Family: Not on file   Attends Religious Services: Not on file   Active Member of Clubs or Organizations: Not on file   Attends Archivist Meetings: Not on file   Marital Status: Not on file    Allergies: No Known Allergies  Metabolic Disorder Labs: No results found for: HGBA1C, MPG No results found for: PROLACTIN Lab Results  Component Value Date   CHOL 219 (H) 09/23/2019   TRIG 160 (H) 09/23/2019   HDL 51 09/23/2019   CHOLHDL 4.2 05/15/2017   [place] 139 (H) 09/23/2019   [place] 168 (H) 03/20/2019   No results found for: TSH  Therapeutic Level Labs: No results found for: LITHIUM No results found for: VALPROATE No components found for:  CBMZ  Current Medications: Current Outpatient Medications  Medication Sig Dispense Refill   albuterol (PROVENTIL HFA;VENTOLIN HFA) 108 (90 Base) MCG/ACT inhaler Inhale 2 puffs into the lungs every 6 (six) hours as needed for wheezing or shortness of breath. 1 Inhaler 2   eszopiclone (LUNESTA) 2 MG TABS tablet Take 1 tablet (2 mg total) by mouth at bedtime. Take immediately before bedtime 30 tablet 1   losartan-hydrochlorothiazide (HYZAAR) 100-25 MG tablet Take 1 tablet by mouth daily. 90 tablet 1   vortioxetine HBr (TRINTELLIX) 20 MG TABS tablet Take 1 tablet (20 mg  total) by mouth daily. Pt is on PAP 90 tablet 3   No current facility-administered medications for this visit.    Musculoskeletal: Strength & Muscle Tone: unable to assess due to telemed visit Gait & Station: unable to assess due to telemed visit Patient leans: unable to assess due to telemed visit  Psychiatric Specialty Exam: ROS  There were no vitals taken for this visit.There is no height or weight on file to calculate BMI.  General Appearance: Fairly groomed  Eye  Contact:  Good  Speech:  Clear and Coherent and Normal Rate  Volume:  Normal  Mood:  Euthymic  Affect:  Congruent  Thought Process:  Goal Directed, Linear and Descriptions of Associations: Intact  Orientation:  Full (Time, Place, and Person)  Thought Content: Logical   Suicidal Thoughts:  No  Homicidal Thoughts:  No  Memory:  Recent;   Good Remote;   Good  Judgement:  Fair  Insight:  Fair  Psychomotor Activity:  Normal  Concentration:  Concentration: Good and Attention Span: Good  Recall:  Good  Fund of Knowledge: Good  Language: Good  Akathisia:  Negative  Handed:  Right  Assets:  Communication Skills Desire for Improvement Financial Resources/Insurance Housing Social Support Transportation  ADL's:  Intact  Cognition: WNL  Sleep:  Poor   Screenings: GAD-7     Office Visit from 09/23/2019 in [facility]  Total GAD-7 Score 0    PHQ2-9     [facility] Visit [facility] 09/23/2019 in [facility] Office Visit from 03/20/2019 in [facility] Office Visit from 03/05/2018 in [facility] Office Visit from 10/19/2015 in [facility]  PHQ-2 Total Score 0 0 2 2  PHQ-9 Total Score 2 0 4 --       Assessment and Plan: Patient is continuing to struggle with insomnia.  She has tried several different medications primarily for insomnia but has not been able to get any benefit.  She is agreeable to try Lunesta. Potential side effects of medication and risks vs benefits of treatment vs  non-treatment were explained and discussed. All questions were answered.   1. Recurrent major depressive disorder, in full remission ([place])  - vortioxetine HBr (TRINTELLIX) 20 MG TABS tablet; Take 1 tablet (20 mg total) by mouth daily. Pt is on PAP  Dispense: 90 tablet; Refill: 3  2. Generalized anxiety disorder  - vortioxetine HBr (TRINTELLIX) 20 MG TABS tablet; Take 1 tablet (20 mg total) by mouth daily. Pt is on PAP  Dispense: 90 tablet; Refill: 3  3. Primary insomnia  -Continue temazepam due to lack of efficacy. -Start eszopiclone (LUNESTA) 2 MG TABS tablet; Take 1 tablet (2 mg total) by mouth at bedtime. Take immediately before bedtime  Dispense: 30 tablet; Refill: 1   F/up in 2 months.     [place] Crane, MD 03/19/2020, 8:43 AM

## 2020-03-26 ENCOUNTER — Ambulatory Visit: Payer: Medicare Other | Admitting: Family Medicine

## 2020-04-06 DIAGNOSIS — Z23 Encounter for immunization: Secondary | ICD-10-CM | POA: Diagnosis not present

## 2020-04-07 ENCOUNTER — Other Ambulatory Visit: Payer: Self-pay

## 2020-04-07 ENCOUNTER — Ambulatory Visit (INDEPENDENT_AMBULATORY_CARE_PROVIDER_SITE_OTHER): Payer: Medicare Other | Admitting: Family Medicine

## 2020-04-07 ENCOUNTER — Encounter: Payer: Self-pay | Admitting: Family Medicine

## 2020-04-07 VITALS — BP 118/70 | HR 75 | Ht 63.0 in | Wt 194.0 lb

## 2020-04-07 DIAGNOSIS — I10 Essential (primary) hypertension: Secondary | ICD-10-CM

## 2020-04-07 DIAGNOSIS — L723 Sebaceous cyst: Secondary | ICD-10-CM | POA: Diagnosis not present

## 2020-04-07 MED ORDER — LOSARTAN POTASSIUM-HCTZ 100-25 MG PO TABS: 1.0000 | ORAL_TABLET | Freq: Every day | ORAL | 1 refills | Status: DC

## 2020-04-07 NOTE — Patient Instructions (Signed)
Epidermal Cyst  An epidermal cyst is a sac made of skin tissue. The sac contains a substance called keratin. Keratin is a protein that is normally secreted through the hair follicles. When keratin becomes trapped in the top layer of skin (epidermis), it can form an epidermal cyst. Epidermal cysts can be found anywhere on your body. These cysts are usually harmless (benign), and they may not cause symptoms unless they become infected. What are the causes? This condition may be caused by:  A blocked hair follicle.  A hair that curls and re-enters the skin instead of growing straight out of the skin (ingrown hair).  A blocked pore.  Irritated skin.  An injury to the skin.  Certain conditions that are passed along from parent to child (inherited).  Human papillomavirus (HPV).  Long-term (chronic) sun damage to the skin. What increases the risk? The following factors may make you more likely to develop an epidermal cyst:  Having acne.  Being overweight.  Being 30-40 years old. What are the signs or symptoms? The only symptom of this condition may be a small, painless lump underneath the skin. When an epidermal cyst ruptures, it may become infected. Symptoms may include:  Redness.  Inflammation.  Tenderness.  Warmth.  Fever.  Keratin draining from the cyst. Keratin is grayish-white, bad-smelling substance.  Pus draining from the cyst. How is this diagnosed? This condition is diagnosed with a physical exam.  In some cases, you may have a sample of tissue (biopsy) taken from your cyst to be examined under a microscope or tested for bacteria.  You may be referred to a health care provider who specializes in skin care (dermatologist). How is this treated? In many cases, epidermal cysts go away on their own without treatment. If a cyst becomes infected, treatment may include:  Opening and draining the cyst, done by a health care provider. After draining, minor surgery to  remove the rest of the cyst may be done.  Antibiotic medicine.  Injections of medicines (steroids) that help to reduce inflammation.  Surgery to remove the cyst. Surgery may be done if the cyst: ? Becomes large. ? Bothers you. ? Has a chance of turning into cancer.  Do not try to open a cyst yourself. Follow these instructions at home:  Take over-the-counter and prescription medicines only as told by your health care provider.  If you were prescribed an antibiotic medicine, take it it as told by your health care provider. Do not stop using the antibiotic even if you start to feel better.  Keep the area around your cyst clean and dry.  Wear loose, dry clothing.  Avoid touching your cyst.  Check your cyst every day for signs of infection. Check for: ? Redness, swelling, or pain. ? Fluid or blood. ? Warmth. ? Pus or a bad smell.  Keep all follow-up visits as told by your health care provider. This is important. How is this prevented?  Wear clean, dry, clothing.  Avoid wearing tight clothing.  Keep your skin clean and dry. Take showers or baths every day. Contact a health care provider if:  Your cyst develops symptoms of infection.  Your condition is not improving or is getting worse.  You develop a cyst that looks different from other cysts you have had.  You have a fever. Get help right away if:  Redness spreads from the cyst into the surrounding area. Summary  An epidermal cyst is a sac made of skin tissue. These cysts are   usually harmless (benign), and they may not cause symptoms unless they become infected.  If a cyst becomes infected, treatment may include surgery to open and drain the cyst, or to remove it. Treatment may also include medicines by mouth or through an injection.  Take over-the-counter and prescription medicines only as told by your health care provider. If you were prescribed an antibiotic medicine, take it as told by your health care  provider. Do not stop using the antibiotic even if you start to feel better.  Contact a health care provider if your condition is not improving or is getting worse.  Keep all follow-up visits as told by your health care provider. This is important. This information is not intended to replace advice given to you by your health care provider. Make sure you discuss any questions you have with your health care provider. Document Revised: 11/01/2018 Document Reviewed: 01/22/2018 Elsevier Patient Education  2020 Elsevier Inc.  

## 2020-04-07 NOTE — Progress Notes (Signed)
Date:  04/07/2020   Name:  Barbara Durham   DOB:  11/13/1960   MRN:  [id number]   Chief Complaint: Hypertension and Nevus (place on chest that has been there x 1 month)  Hypertension This is a chronic problem. The current episode started more than 1 year ago. The problem has been gradually improving since onset. The problem is controlled. Pertinent negatives include no anxiety, blurred vision, chest pain, headaches, malaise/fatigue, neck pain, orthopnea, palpitations, peripheral edema, PND, shortness of breath or sweats. There are no associated agents to hypertension. There are no known risk factors for coronary artery disease. Past treatments include nothing. The current treatment provides moderate improvement. There are no compliance problems.  There is no history of angina, kidney disease, CAD/MI, CVA, heart failure, left ventricular hypertrophy, PVD or retinopathy. There is no history of chronic renal disease, a hypertension causing med or renovascular disease.    Lab Results  Component Value Date   CREATININE 1.25 (H) 09/23/2019   BUN 17 09/23/2019   NA 140 09/23/2019   K 3.8 09/23/2019   CL 99 09/23/2019   CO2 22 09/23/2019   Lab Results  Component Value Date   CHOL 219 (H) 09/23/2019   HDL 51 09/23/2019   LDLCALC 139 (H) 09/23/2019   TRIG 160 (H) 09/23/2019   CHOLHDL 4.2 05/15/2017   No results found for: TSH No results found for: HGBA1C Lab Results  Component Value Date   WBC 10.6 10/29/2014   HGB 13.5 10/29/2014   HCT 39.7 10/29/2014   MCV 103 (H) 10/29/2014   PLT 187 10/29/2014   Lab Results  Component Value Date   ALT 14 10/29/2014   AST 19 10/29/2014   ALKPHOS 62 10/29/2014   BILITOT 0.9 10/29/2014     Review of Systems  Constitutional: Negative.  Negative for chills, fatigue, fever, malaise/fatigue and unexpected weight change.  HENT: Negative for congestion, ear discharge, ear pain, postnasal drip, rhinorrhea, sinus pressure, sneezing and sore  throat.   Eyes: Negative for blurred vision, photophobia, pain, discharge, redness and itching.  Respiratory: Negative for cough, shortness of breath, wheezing and stridor.   Cardiovascular: Negative for chest pain, palpitations, orthopnea and PND.  Gastrointestinal: Negative for abdominal pain, blood in stool, constipation, diarrhea, nausea and vomiting.  Endocrine: Negative for cold intolerance, heat intolerance, polydipsia, polyphagia and polyuria.  Genitourinary: Negative for dysuria, flank pain, frequency, hematuria, menstrual problem, pelvic pain, urgency, vaginal bleeding and vaginal discharge.  Musculoskeletal: Negative for arthralgias, back pain, myalgias and neck pain.  Skin: Negative for rash.  Allergic/Immunologic: Negative for environmental allergies and food allergies.  Neurological: Negative for dizziness, weakness, light-headedness, numbness and headaches.  Hematological: Negative for adenopathy. Does not bruise/bleed easily.  Psychiatric/Behavioral: Negative for dysphoric mood. The patient is not nervous/anxious.     Patient Active Problem List   Diagnosis Date Noted  . Primary insomnia 03/19/2020  . Recurrent major depressive disorder, in full remission ([place]) 08/06/2019  . MDD (major depressive disorder), recurrent episode, moderate ([place]) 05/13/2019  . Generalized anxiety disorder 05/13/2019  . Hypertension 01/21/2015  . Anxiety and depression 01/21/2015  . Back ache 06/14/2013  . Lumbar transverse process fracture ([place]) 06/14/2013    No Known Allergies  Past Surgical History:  Procedure Laterality Date  . arm fracture Left   . COLONOSCOPY  2012   normal  . CYSTOSCOPY  11/2011  . DENTAL SURGERY    . FOOT SURGERY Bilateral   . LAPAROSCOPY  04/10/1997  .  LSO  1998   with LOA  . ORIF ORBITAL FRACTURE Left 01/16/2018   Procedure: OPEN REDUCTION INTERNAL FIXATION ORBITAL BLOW OUT FRACTURE;  Surgeon: Clyde Canterbury, MD;  Location: [facility];  Service: ENT;   Laterality: Left;  LEIBINGER SET    WIRE MESH  . TOTAL ABDOMINAL HYSTERECTOMY  1993   with RSO  . TUBAL LIGATION  1989    Social History   Tobacco Use  . Smoking status: Never Smoker  . Smokeless tobacco: Never Used  Vaping Use  . Vaping Use: Never used  Substance Use Topics  . Alcohol use: No    Alcohol/week: 0.0 - 1.0 standard drinks    Comment: social  . Drug use: No     Medication list has been reviewed and updated.  Current Meds  Medication Sig  . albuterol (PROVENTIL HFA;VENTOLIN HFA) 108 (90 Base) MCG/ACT inhaler Inhale 2 puffs into the lungs every 6 (six) hours as needed for wheezing or shortness of breath.  . losartan-hydrochlorothiazide (HYZAAR) 100-25 MG tablet Take 1 tablet by mouth daily.  Marland Kitchen vortioxetine HBr (TRINTELLIX) 20 MG TABS tablet Take 1 tablet (20 mg total) by mouth daily. Pt is on PAP    PHQ 2/9 Scores 04/07/2020 09/23/2019 03/20/2019 03/05/2018  PHQ - 2 Score 0 0 0 2  PHQ- 9 Score 4 2 0 4    GAD 7 : Generalized Anxiety Score 04/07/2020 09/23/2019  Nervous, Anxious, on Edge 0 0  Control/stop worrying 0 0  Worry too much - different things 0 0  Trouble relaxing 0 0  Restless 0 0  Easily annoyed or irritable 0 0  Afraid - awful might happen 0 0  Total GAD 7 Score 0 0    BP Readings from Last 3 Encounters:  04/07/20 118/70  09/23/19 120/68  03/20/19 (!) 130/100    Physical Exam Vitals and nursing note reviewed.  Constitutional:      Appearance: She is well-developed.  HENT:     Head: Normocephalic.     Right Ear: Tympanic membrane, ear canal and external ear normal. There is no impacted cerumen.     Left Ear: Tympanic membrane, ear canal and external ear normal. There is no impacted cerumen.     Nose: Nose normal. No congestion or rhinorrhea.     Mouth/Throat:     Mouth: Mucous membranes are moist.  Eyes:     General: Lids are everted, no foreign bodies appreciated. No scleral icterus.       Left eye: No foreign body or hordeolum.      Conjunctiva/sclera: Conjunctivae normal.     Right eye: Right conjunctiva is not injected.     Left eye: Left conjunctiva is not injected.     Pupils: Pupils are equal, round, and reactive to light.  Neck:     Thyroid: No thyromegaly.     Vascular: No JVD.     Trachea: No tracheal deviation.  Cardiovascular:     Rate and Rhythm: Normal rate and regular rhythm.     Heart sounds: Normal heart sounds. No murmur heard.  No friction rub. No gallop.   Pulmonary:     Effort: Pulmonary effort is normal. No respiratory distress.     Breath sounds: Normal breath sounds. No wheezing, rhonchi or rales.  Chest:     Chest wall: Tenderness present.       Comments: 1 cm epidermal cyst/chest Abdominal:     General: Bowel sounds are normal.  Palpations: Abdomen is soft. There is no mass.     Tenderness: There is no abdominal tenderness. There is no guarding or rebound.  Musculoskeletal:        General: No tenderness. Normal range of motion.     Cervical back: Normal range of motion and neck supple.  Lymphadenopathy:     Cervical: No cervical adenopathy.     Upper Body:     Right upper body: No supraclavicular or axillary adenopathy.     Left upper body: No supraclavicular or axillary adenopathy.  Skin:    General: Skin is warm.     Findings: No rash.  Neurological:     Mental Status: She is alert and oriented to person, place, and time.     Cranial Nerves: No cranial nerve deficit.     Deep Tendon Reflexes: Reflexes normal.  Psychiatric:        Mood and Affect: Mood is not anxious or depressed.     Wt Readings from Last 3 Encounters:  04/07/20 194 lb (88 kg)  09/23/19 205 lb (93 kg)  03/20/19 199 lb (90.3 kg)    BP 118/70   Pulse 75   Ht 5\' 3"  (1.6 m)   Wt 194 lb (88 kg)   SpO2 98%   BMI 34.37 kg/m   Assessment and Plan: 1. Essential hypertension Chronic.  Controlled.  Stable.  Blood pressure today 118/70.  Continue losartan hydrochlorothiazide 100-25 mg once a day. -  Renal Function Panel - losartan-hydrochlorothiazide (HYZAAR) 100-25 MG tablet; Take 1 tablet by mouth daily.  Dispense: 90 tablet; Refill: 1  2. Sebaceous cyst New onset.  Persistent.  Gradually enlarging that patient is concerned that it has a base that is movable now.  This is actually a sebaceous cyst that that you can move over the chest wall sternal area.  There is a little hole noted of the cysts drainage.  There is mild tenderness with minimal manipulation.  This is consistent with a sebaceous cyst and referral to dermatology for excision. - Ambulatory referral to Dermatology

## 2020-04-08 LAB — RENAL FUNCTION PANEL
Albumin: 4.4 g/dL (ref 3.8–4.9)
BUN/Creatinine Ratio: 13 (ref 12–28)
BUN: 14 mg/dL (ref 8–27)
CO2: 24 mmol/L (ref 20–29)
Calcium: 9.7 mg/dL (ref 8.7–10.3)
Chloride: 101 mmol/L (ref 96–106)
Creatinine, Ser: 1.05 mg/dL — ABNORMAL HIGH (ref 0.57–1.00)
GFR calc Af Amer: 67 mL/min/{1.73_m2} (ref >59)
GFR calc non Af Amer: 58 mL/min/{1.73_m2} — ABNORMAL LOW (ref >59)
Glucose: 93 mg/dL (ref 65–99)
Phosphorus: 3.2 mg/dL (ref 3.0–4.3)
Potassium: 4 mmol/L (ref 3.5–5.2)
Sodium: 139 mmol/L (ref 134–144)

## 2020-04-27 DIAGNOSIS — Z23 Encounter for immunization: Secondary | ICD-10-CM | POA: Diagnosis not present

## 2020-05-07 DIAGNOSIS — D235 Other benign neoplasm of skin of trunk: Secondary | ICD-10-CM | POA: Diagnosis not present

## 2020-05-07 DIAGNOSIS — D485 Neoplasm of uncertain behavior of skin: Secondary | ICD-10-CM | POA: Diagnosis not present

## 2020-05-13 ENCOUNTER — Ambulatory Visit (INDEPENDENT_AMBULATORY_CARE_PROVIDER_SITE_OTHER): Payer: Medicare Other

## 2020-05-13 DIAGNOSIS — Z1211 Encounter for screening for malignant neoplasm of colon: Secondary | ICD-10-CM | POA: Diagnosis not present

## 2020-05-13 DIAGNOSIS — Z0000 Encounter for general adult medical examination without abnormal findings: Secondary | ICD-10-CM

## 2020-05-13 DIAGNOSIS — Z1231 Encounter for screening mammogram for malignant neoplasm of breast: Secondary | ICD-10-CM | POA: Diagnosis not present

## 2020-05-13 NOTE — Progress Notes (Signed)
Subjective:   Barbara Durham is a 59 y.o. female who presents for an Initial Medicare Annual Wellness Visit.  Virtual Visit via Telephone Note  I connected with  Barbara Durham on 05/13/20 at  2:40 PM EDT by telephone and verified that I am speaking with the correct person using two identifiers.  Medicare Annual Wellness visit completed telephonically due to Covid-19 pandemic.   Location: Patient: home Provider: [facility]   I discussed the limitations, risks, security and privacy concerns of performing an evaluation and management service by telephone and the availability of in person appointments. The patient expressed understanding and agreed to proceed.  Unable to perform video visit due to video visit attempted and failed and/or patient does not have video capability.   Some vital signs may be absent or patient reported.   Clemetine Marker, LPN    Review of Systems     Cardiac Risk Factors include: advanced age (>37men, >26 women);hypertension     Objective:    There were no vitals filed for this visit. There is no height or weight on file to calculate BMI.  Advanced Directives 05/13/2020 01/16/2018  Does Patient Have a Medical Advance Directive? No No  Would patient like information on creating a medical advance directive? Yes (MAU/Ambulatory/Procedural Areas - Information given) No - Patient declined  Some encounter information is confidential and restricted. Go to Review Flowsheets activity to see all data.    Current Medications (verified) Outpatient Encounter Medications as of 05/13/2020  Medication Sig  . albuterol (PROVENTIL HFA;VENTOLIN HFA) 108 (90 Base) MCG/ACT inhaler Inhale 2 puffs into the lungs every 6 (six) hours as needed for wheezing or shortness of breath.  . losartan-hydrochlorothiazide (HYZAAR) 100-25 MG tablet Take 1 tablet by mouth daily.  Marland Kitchen vortioxetine HBr (TRINTELLIX) 20 MG TABS tablet Take 1 tablet (20 mg total) by mouth daily. Pt is on PAP    . [DISCONTINUED] eszopiclone (LUNESTA) 2 MG TABS tablet Take 1 tablet (2 mg total) by mouth at bedtime. Take immediately before bedtime   No facility-administered encounter medications on file as of 05/13/2020.    Allergies (verified) Patient has no known allergies.   History: Past Medical History:  Diagnosis Date  . Anxiety   . Depression   . Endometriosis   . GERD (gastroesophageal reflux disease)   . Hemorrhoids   . Hypertension   . Nocturia    enuresis  . Osteopenia 10/2009, 6/20   in spine and hip  . PONV (postoperative nausea and vomiting)   . Stress incontinence, female    Past Surgical History:  Procedure Laterality Date  . arm fracture Left   . COLONOSCOPY  2012   normal  . CYSTOSCOPY  11/2011  . DENTAL SURGERY    . FOOT SURGERY Bilateral   . LAPAROSCOPY  04/10/1997  . LSO  1998   with LOA  . ORIF ORBITAL FRACTURE Left 01/16/2018   Procedure: OPEN REDUCTION INTERNAL FIXATION ORBITAL BLOW OUT FRACTURE;  Surgeon: Clyde Canterbury, MD;  Location: [facility];  Service: ENT;  Laterality: Left;  LEIBINGER SET    WIRE MESH  . TOTAL ABDOMINAL HYSTERECTOMY  1993   with RSO  . TUBAL LIGATION  1989   Family History  Problem Relation Age of Onset  . Heart disease Mother   . Hypertension Mother   . Dementia Mother   . Depression Mother   . Diabetes Mother        type 2  . Alzheimer's disease Father   .  Stroke Father   . CVA Father   . Prostate cancer Father   . Heart disease Sister   . Osteoporosis Sister   . Arthritis Brother   . Depression Brother   . Depression Brother   . Gout Brother   . Hypertension Brother   . Breast cancer Paternal Aunt        76s   Social History   Socioeconomic History  . Marital status: Divorced    Spouse name: Not on file  . Number of children: 1  . Years of education: Not on file  . Highest education level: Not on file  Occupational History  . Not on file  Tobacco Use  . Smoking status: Never Smoker  .  Smokeless tobacco: Never Used  Vaping Use  . Vaping Use: Never used  Substance and Sexual Activity  . Alcohol use: No    Alcohol/week: 0.0 - 1.0 standard drinks    Comment: social  . Drug use: No  . Sexual activity: Not Currently    Birth control/protection: None  Other Topics Concern  . Not on file  Social History Narrative   Pt lives alone   Social Determinants of Health   Financial Resource Strain: Low Risk   . Difficulty of Paying Living Expenses: Not hard at all  Food Insecurity: No Food Insecurity  . Worried About Charity fundraiser in the Last Year: Never true  . Ran Out of Food in the Last Year: Never true  Transportation Needs: No Transportation Needs  . Lack of Transportation (Medical): No  . Lack of Transportation (Non-Medical): No  Physical Activity: Inactive  . Days of Exercise per Week: 0 days  . Minutes of Exercise per Session: 0 min  Stress: No Stress Concern Present  . Feeling of Stress : Not at all  Social Connections: Socially Isolated  . Frequency of Communication with Friends and Family: More than three times a week  . Frequency of Social Gatherings with Friends and Family: More than three times a week  . Attends Religious Services: Never  . Active Member of Clubs or Organizations: No  . Attends Archivist Meetings: Never  . Marital Status: Divorced    Tobacco Counseling Counseling given: Not Answered   Clinical Intake:  Pre-visit preparation completed: Yes  Pain : No/denies pain     Nutritional Risks: None Diabetes: No  How often do you need to have someone help you when you read instructions, pamphlets, or other written materials from your doctor or pharmacy?: 1 - Never    Interpreter Needed?: No  Information entered by :: Clemetine Marker LPN   Activities of Daily Living In your present state of health, do you have any difficulty performing the following activities: 05/13/2020  Hearing? N  Comment declines hearing aids    Vision? N  Difficulty concentrating or making decisions? N  Walking or climbing stairs? N  Dressing or bathing? N  Doing errands, shopping? N  Preparing Food and eating ? N  Using the Toilet? N  In the past six months, have you accidently leaked urine? N  Do you have problems with loss of bowel control? N  Managing your Medications? N  Managing your Finances? N  Housekeeping or managing your Housekeeping? N  Some recent data might be hidden    Patient Care Team: Juline Patch, MD as PCP - General (Family Medicine)  Indicate any recent Medical Services you may have received from other than [facility] providers in  the past year (date may be approximate).     Assessment:   This is a routine wellness examination for [place].  Hearing/Vision screen  Hearing Screening   125Hz  250Hz  500Hz  1000Hz  2000Hz  3000Hz  4000Hz  6000Hz  8000Hz   Right ear:           Left ear:           Comments: Pt denies hearing difficulty  Vision Screening Comments: Annual vision screenings done by Dr. Matilde Sprang  Dietary issues and exercise activities discussed: Current Exercise Habits: The patient does not participate in regular exercise at present, Exercise limited by: None identified  Goals    . Weight (lb) < 180 lb (81.6 kg)     Pt states she would like to lose weight with healthy eating and increasing physical activity       Depression Screen PHQ 2/9 Scores 05/13/2020 04/07/2020 09/23/2019 03/20/2019 03/05/2018 10/19/2015  PHQ - 2 Score 0 0 0 0 2 2  PHQ- 9 Score - 4 2 0 4 -    Fall Risk Fall Risk  05/13/2020 04/07/2020 09/23/2019 10/19/2015  Falls in the past year? 0 0 0 No  Number falls in past yr: 0 - - -  Injury with Fall? 0 - - -  Risk for fall due to : No Fall Risks - - -  Follow up Falls prevention discussed Falls evaluation completed Falls evaluation completed -    Any stairs in or around the home? Yes  If so, are there any without handrails? No  Home free of loose throw rugs in walkways, pet beds,  electrical cords, etc? Yes  Adequate lighting in your home to reduce risk of falls? Yes   ASSISTIVE DEVICES UTILIZED TO PREVENT FALLS:  Life alert? No  Use of a cane, walker or w/c? No  Grab bars in the bathroom? No  Shower chair or bench in shower? No  Elevated toilet seat or a handicapped toilet? No   TIMED UP AND GO:  Was the test performed? No . Telephonic visit.   Cognitive Function: 6CIT deferred for 2021 AWV; pt states no memory issues        Immunizations Immunization History  Administered Date(s) Administered  . Influenza,inj,Quad PF,6+ Mos 07/06/2016, 05/15/2017, 09/05/2018, 09/23/2019  . PFIZER SARS-COV-2 Vaccination 04/06/2020, 04/27/2020  . Tdap 05/19/2013, 07/06/2016    TDAP status: Up to date   Flu vaccine status: due for 2021-2022 season  Pneumococcal vaccine status: due at age 1  Covid-19 vaccine status: Completed vaccines  Qualifies for Shingles Vaccine? Yes   Zostavax completed No   Shingrix Completed?: No.    Education has been provided regarding the importance of this vaccine. Patient has been advised to call insurance company to determine out of pocket expense if they have not yet received this vaccine. Advised may also receive vaccine at local pharmacy or Health Dept. Verbalized acceptance and understanding.  Screening Tests Health Maintenance  Topic Date Due  . INFLUENZA VACCINE  02/23/2020  . Hepatitis C Screening  09/22/2020 (Originally 07-05-1961)  . HIV Screening  09/22/2020 (Originally 04/07/1976)  . COLONOSCOPY  07/25/2020  . MAMMOGRAM  01/10/2021  . PAP SMEAR-Modifier  08/20/2021  . TETANUS/TDAP  07/06/2026  . COVID-19 Vaccine  Completed    Health Maintenance  Health Maintenance Due  Topic Date Due  . INFLUENZA VACCINE  02/23/2020    Colorectal cancer screening: Completed 2012. Repeat every 10 years   Mammogram status: Completed 01/11/19. Repeat every year   Bone Density status: Completed  01/11/19. Results reflect: Bone  density results: OSTEOPENIA. Repeat every 2 years.  Lung Cancer Screening: (Low Dose CT Chest recommended if Age 20-80 years, 30 pack-year currently smoking OR have quit w/in 15years.) does not qualify.   Additional Screening:  Hepatitis C Screening: does qualify; postponed  Vision Screening: Recommended annual ophthalmology exams for early detection of glaucoma and other disorders of the eye. Is the patient up to date with their annual eye exam?  Yes  Who is the provider or what is the name of the office in which the patient attends annual eye exams? Dr. Matilde Sprang   Dental Screening: Recommended annual dental exams for proper oral hygiene  Community Resource Referral / Chronic Care Management: CRR required this visit?  No   CCM required this visit?  No      Plan:     I have personally reviewed and noted the following in the patient's chart:   . Medical and social history . Use of alcohol, tobacco or illicit drugs  . Current medications and supplements . Functional ability and status . Nutritional status . Physical activity . Advanced directives . List of other physicians . Hospitalizations, surgeries, and ER visits in previous 12 months . Vitals . Screenings to include cognitive, depression, and falls . Referrals and appointments  In addition, I have reviewed and discussed with patient certain preventive protocols, quality metrics, and best practice recommendations. A written personalized care plan for preventive services as well as general preventive health recommendations were provided to patient.     Clemetine Marker, LPN   [id number]   Nurse Notes: none

## 2020-05-13 NOTE — Patient Instructions (Signed)
Barbara Durham , Thank you for taking time to come for your Medicare Wellness Visit. I appreciate your ongoing commitment to your health goals. Please review the following plan we discussed and let me know if I can assist you in the future.   Screening recommendations/referrals: Colonoscopy: done 2012. Referral sent to [facility] today for repeat screening colonoscopy. They will contact you for an appointment.  Mammogram: done 01/11/19. Please call [phone] to schedule your mammogram.  Bone Density: done 01/11/19 Recommended yearly ophthalmology/optometry visit for glaucoma screening and checkup Recommended yearly dental visit for hygiene and checkup  Vaccinations: Influenza vaccine: due Pneumococcal vaccine: due age 10 Tdap vaccine: done 07/06/16 Shingles vaccine: Shingrix discussed. Please contact your pharmacy for coverage information.  Covid-19: done 04/06/20 & 04/27/20  Advanced directives: Advance directive discussed with you today. I have provided a copy for you to complete at home and have notarized. Once this is complete please bring a copy in to our office so we can scan it into your chart.  Conditions/risks identified: Recommend healthy eating and physical activity for desired weight loss  Next appointment: Follow up in one year for your annual wellness visit.   Preventive Care 40-64 Years, Female Preventive care refers to lifestyle choices and visits with your health care provider that can promote health and wellness. What does preventive care include?  A yearly physical exam. This is also called an annual well check.  Dental exams once or twice a year.  Routine eye exams. Ask your health care provider how often you should have your eyes checked.  Personal lifestyle choices, including:  Daily care of your teeth and gums.  Regular physical activity.  Eating a healthy diet.  Avoiding tobacco and drug use.  Limiting alcohol use.  Practicing safe  sex.  Taking low-dose aspirin daily starting at age 33.  Taking vitamin and mineral supplements as recommended by your health care provider. What happens during an annual well check? The services and screenings done by your health care provider during your annual well check will depend on your age, overall health, lifestyle risk factors, and family history of disease. Counseling  Your health care provider may ask you questions about your:  Alcohol use.  Tobacco use.  Drug use.  Emotional well-being.  Home and relationship well-being.  Sexual activity.  Eating habits.  Work and work Statistician.  Method of birth control.  Menstrual cycle.  Pregnancy history. Screening  You may have the following tests or measurements:  Height, weight, and BMI.  Blood pressure.  Lipid and cholesterol levels. These may be checked every 5 years, or more frequently if you are over 51 years old.  Skin check.  Lung cancer screening. You may have this screening every year starting at age 30 if you have a 30-pack-year history of smoking and currently smoke or have quit within the past 15 years.  Fecal occult blood test (FOBT) of the stool. You may have this test every year starting at age 36.  Flexible sigmoidoscopy or colonoscopy. You may have a sigmoidoscopy every 5 years or a colonoscopy every 10 years starting at age 67.  Hepatitis C blood test.  Hepatitis B blood test.  Sexually transmitted disease (STD) testing.  Diabetes screening. This is done by checking your blood sugar (glucose) after you have not eaten for a while (fasting). You may have this done every 1-3 years.  Mammogram. This may be done every 1-2 years. Talk to your health care provider about when you should  start having regular mammograms. This may depend on whether you have a family history of breast cancer.  BRCA-related cancer screening. This may be done if you have a family history of breast, ovarian, tubal, or  peritoneal cancers.  Pelvic exam and Pap test. This may be done every 3 years starting at age 1. Starting at age 27, this may be done every 5 years if you have a Pap test in combination with an HPV test.  Bone density scan. This is done to screen for osteoporosis. You may have this scan if you are at high risk for osteoporosis. Discuss your test results, treatment options, and if necessary, the need for more tests with your health care provider. Vaccines  Your health care provider may recommend certain vaccines, such as:  Influenza vaccine. This is recommended every year.  Tetanus, diphtheria, and acellular pertussis (Tdap, Td) vaccine. You may need a Td booster every 10 years.  Zoster vaccine. You may need this after age 69.  Pneumococcal 13-valent conjugate (PCV13) vaccine. You may need this if you have certain conditions and were not previously vaccinated.  Pneumococcal polysaccharide (PPSV23) vaccine. You may need one or two doses if you smoke cigarettes or if you have certain conditions. Talk to your health care provider about which screenings and vaccines you need and how often you need them. This information is not intended to replace advice given to you by your health care provider. Make sure you discuss any questions you have with your health care provider. Document Released: 08/07/2015 Document Revised: 03/30/2016 Document Reviewed: 05/12/2015 Elsevier Interactive Patient Education  2017 [place] Prevention in the Home Falls can cause injuries. They can happen to people of all ages. There are many things you can do to make your home safe and to help prevent falls. What can I do on the outside of my home?  Regularly fix the edges of walkways and driveways and fix any cracks.  Remove anything that might make you trip as you walk through a door, such as a raised step or threshold.  Trim any bushes or trees on the path to your home.  Use bright outdoor  lighting.  Clear any walking paths of anything that might make someone trip, such as rocks or tools.  Regularly check to see if handrails are loose or broken. Make sure that both sides of any steps have handrails.  Any raised decks and porches should have guardrails on the edges.  Have any leaves, snow, or ice cleared regularly.  Use sand or salt on walking paths during winter.  Clean up any spills in your garage right away. This includes oil or grease spills. What can I do in the bathroom?  Use night lights.  Install grab bars by the toilet and in the tub and shower. Do not use towel bars as grab bars.  Use non-skid mats or decals in the tub or shower.  If you need to sit down in the shower, use a plastic, non-slip stool.  Keep the floor dry. Clean up any water that spills on the floor as soon as it happens.  Remove soap buildup in the tub or shower regularly.  Attach bath mats securely with double-sided non-slip rug tape.  Do not have throw rugs and other things on the floor that can make you trip. What can I do in the bedroom?  Use night lights.  Make sure that you have a light by your bed that is easy  to reach.  Do not use any sheets or blankets that are too big for your bed. They should not hang down onto the floor.  Have a firm chair that has side arms. You can use this for support while you get dressed.  Do not have throw rugs and other things on the floor that can make you trip. What can I do in the kitchen?  Clean up any spills right away.  Avoid walking on wet floors.  Keep items that you use a lot in easy-to-reach places.  If you need to reach something above you, use a strong step stool that has a grab bar.  Keep electrical cords out of the way.  Do not use floor polish or wax that makes floors slippery. If you must use wax, use non-skid floor wax.  Do not have throw rugs and other things on the floor that can make you trip. What can I do with my  stairs?  Do not leave any items on the stairs.  Make sure that there are handrails on both sides of the stairs and use them. Fix handrails that are broken or loose. Make sure that handrails are as long as the stairways.  Check any carpeting to make sure that it is firmly attached to the stairs. Fix any carpet that is loose or worn.  Avoid having throw rugs at the top or bottom of the stairs. If you do have throw rugs, attach them to the floor with carpet tape.  Make sure that you have a light switch at the top of the stairs and the bottom of the stairs. If you do not have them, ask someone to add them for you. What else can I do to help prevent falls?  Wear shoes that:  Do not have high heels.  Have rubber bottoms.  Are comfortable and fit you well.  Are closed at the toe. Do not wear sandals.  If you use a stepladder:  Make sure that it is fully opened. Do not climb a closed stepladder.  Make sure that both sides of the stepladder are locked into place.  Ask someone to hold it for you, if possible.  Clearly mark and make sure that you can see:  Any grab bars or handrails.  First and last steps.  Where the edge of each step is.  Use tools that help you move around (mobility aids) if they are needed. These include:  Canes.  Walkers.  Scooters.  Crutches.  Turn on the lights when you go into a dark area. Replace any light bulbs as soon as they burn out.  Set up your furniture so you have a clear path. Avoid moving your furniture around.  If any of your floors are uneven, fix them.  If there are any pets around you, be aware of where they are.  Review your medicines with your doctor. Some medicines can make you feel dizzy. This can increase your chance of falling. Ask your doctor what other things that you can do to help prevent falls. This information is not intended to replace advice given to you by your health care provider. Make sure you discuss any  questions you have with your health care provider. Document Released: 05/07/2009 Document Revised: 12/17/2015 Document Reviewed: 08/15/2014 Elsevier Interactive Patient Education  2017 [employer].

## 2020-05-14 ENCOUNTER — Telehealth: Payer: Self-pay

## 2020-05-14 NOTE — Telephone Encounter (Signed)
Called pt with mammo appt for Nov 9th @ 9:40 in [place]

## 2020-05-19 ENCOUNTER — Telehealth (HOSPITAL_COMMUNITY): Payer: Medicare Other | Admitting: Psychiatry

## 2020-05-22 ENCOUNTER — Telehealth (INDEPENDENT_AMBULATORY_CARE_PROVIDER_SITE_OTHER): Payer: Medicare Other | Admitting: Physician Assistant

## 2020-05-22 ENCOUNTER — Other Ambulatory Visit: Payer: Self-pay

## 2020-05-22 DIAGNOSIS — F411 Generalized anxiety disorder: Secondary | ICD-10-CM

## 2020-05-22 DIAGNOSIS — F5101 Primary insomnia: Secondary | ICD-10-CM | POA: Diagnosis not present

## 2020-05-22 DIAGNOSIS — F3342 Major depressive disorder, recurrent, in full remission: Secondary | ICD-10-CM

## 2020-06-02 ENCOUNTER — Other Ambulatory Visit: Payer: Self-pay

## 2020-06-02 ENCOUNTER — Ambulatory Visit
Admission: RE | Admit: 2020-06-02 | Discharge: 2020-06-02 | Disposition: A | Discharge status: Home / Self Care | Payer: Medicare Other | Source: Ambulatory Visit | Attending: Family Medicine | Admitting: Family Medicine

## 2020-06-02 DIAGNOSIS — Z1231 Encounter for screening mammogram for malignant neoplasm of breast: Secondary | ICD-10-CM | POA: Diagnosis not present

## 2020-06-04 ENCOUNTER — Ambulatory Visit (INDEPENDENT_AMBULATORY_CARE_PROVIDER_SITE_OTHER): Payer: Medicare Other

## 2020-06-04 ENCOUNTER — Other Ambulatory Visit: Payer: Self-pay

## 2020-06-04 DIAGNOSIS — Z23 Encounter for immunization: Secondary | ICD-10-CM

## 2020-06-20 NOTE — Progress Notes (Signed)
BH MD/PA/NP OP Progress Note  05/22/2020 2:00 PM Barbara Durham  MRN:  [id number]  Chief Complaint: "See how I am doing on my meds."  HPI:  Barbara Durham is a 59 year old female with a past medical history significant for recurrent major depressive disorder (infull remission), generalized anxiety disorder, and primary insomnia who presents to [facility] for follow up and medication management. Patient is currently taking Trintellix 20 mg 1 x daily for the management of her depression and anxiety. Patient states that she has been doing well since the dosage adjustment. Before the dosage adjustment, patient had been experiencing anxiety and crying spells. She reports that the medication has been helping tremendously.  Patient states she is having no issues. Patient mentions that in the past, she has had trouble sleeping for a long time. During the previous encounter, she states that Dr. Toy Care wanted her to try Lunesta for the management of sleep disturbances. The cost of the drug would $20 with insurance. Patient states that she is able to sleep and is ok with not trying anything. Patient has been doing well and stays at home a lot. Patient doesn't go out much and doesn't like to be around large crowds.  Patient denies suicide and homicide ideation. Patient denies auditory and visual hallucinations. Patient reports sleep could be better but she has been able to receive anywhere around 4 - 7 hours of sleep. Patient states appetite has been fine and eats roughly 2 meals a day. Patient denies alcohol, tobacco, and illicit drug use.  Visit Diagnosis:    ICD-10-CM   1. Recurrent major depressive disorder, in full remission ([place])  F33.42   2. Generalized anxiety disorder  F41.1   3. Primary insomnia  F51.01     Past Psychiatric History:  Major depressive disorder Generalized Anxiety Disorder  Past Medical History:  Past Medical History:   Diagnosis Date  . Anxiety   . Depression   . Endometriosis   . GERD (gastroesophageal reflux disease)   . Hemorrhoids   . Hypertension   . Nocturia    enuresis  . Osteopenia 10/2009, 6/20   in spine and hip  . PONV (postoperative nausea and vomiting)   . Stress incontinence, female     Past Surgical History:  Procedure Laterality Date  . arm fracture Left   . COLONOSCOPY  2012   normal  . CYSTOSCOPY  11/2011  . DENTAL SURGERY    . FOOT SURGERY Bilateral   . LAPAROSCOPY  04/10/1997  . LSO  1998   with LOA  . OOPHORECTOMY    . ORIF ORBITAL FRACTURE Left 01/16/2018   Procedure: OPEN REDUCTION INTERNAL FIXATION ORBITAL BLOW OUT FRACTURE;  Surgeon: Clyde Canterbury, MD;  Location: [place];  Service: ENT;  Laterality: Left;  LEIBINGER SET    WIRE MESH  . TOTAL ABDOMINAL HYSTERECTOMY  1993   with RSO  . TUBAL LIGATION  1989    Family Psychiatric History: Depression - siblings  Family History:  Family History  Problem Relation Age of Onset  . Heart disease Mother   . Hypertension Mother   . Dementia Mother   . Depression Mother   . Diabetes Mother        type 2  . Alzheimer's disease Father   . Stroke Father   . CVA Father   . Prostate cancer Father   . Heart disease Sister   . Osteoporosis Sister   . Arthritis Brother   .  Depression Brother   . Depression Brother   . Gout Brother   . Hypertension Brother   . Breast cancer Paternal Aunt        49s    Social History:  Social History   Socioeconomic History  . Marital status: Divorced    Spouse name: Not on file  . Number of children: 1  . Years of education: Not on file  . Highest education level: Not on file  Occupational History  . Not on file  Tobacco Use  . Smoking status: Never Smoker  . Smokeless tobacco: Never Used  Vaping Use  . Vaping Use: Never used  Substance and Sexual Activity  . Alcohol use: No    Alcohol/week: 0.0 - 1.0 standard drinks    Comment: social  . Drug use: No   . Sexual activity: Not Currently    Birth control/protection: None  Other Topics Concern  . Not on file  Social History Narrative   Pt lives alone   Social Determinants of Health   Financial Resource Strain: Low Risk   . Difficulty of Paying Living Expenses: Not hard at all  Food Insecurity: No Food Insecurity  . Worried About Charity fundraiser in the Last Year: Never true  . Ran Out of Food in the Last Year: Never true  Transportation Needs: No Transportation Needs  . Lack of Transportation (Medical): No  . Lack of Transportation (Non-Medical): No  Physical Activity: Inactive  . Days of Exercise per Week: 0 days  . Minutes of Exercise per Session: 0 min  Stress: No Stress Concern Present  . Feeling of Stress : Not at all  Social Connections: Socially Isolated  . Frequency of Communication with Friends and Family: More than three times a week  . Frequency of Social Gatherings with Friends and Family: More than three times a week  . Attends Religious Services: Never  . Active Member of Clubs or Organizations: No  . Attends Archivist Meetings: Never  . Marital Status: Divorced    Allergies: No Known Allergies  Metabolic Disorder Labs: No results found for: HGBA1C, MPG No results found for: PROLACTIN Lab Results  Component Value Date   CHOL 219 (H) 09/23/2019   TRIG 160 (H) 09/23/2019   HDL 51 09/23/2019   CHOLHDL 4.2 05/15/2017   LDLCALC 139 (H) 09/23/2019   LDLCALC 168 (H) 03/20/2019   No results found for: TSH  Therapeutic Level Labs: No results found for: LITHIUM No results found for: VALPROATE No components found for:  CBMZ  Current Medications: Current Outpatient Medications  Medication Sig Dispense Refill  . albuterol (PROVENTIL HFA;VENTOLIN HFA) 108 (90 Base) MCG/ACT inhaler Inhale 2 puffs into the lungs every 6 (six) hours as needed for wheezing or shortness of breath. 1 Inhaler 2  . losartan-hydrochlorothiazide (HYZAAR) 100-25 MG tablet  Take 1 tablet by mouth daily. 90 tablet 1  . vortioxetine HBr (TRINTELLIX) 20 MG TABS tablet Take 1 tablet (20 mg total) by mouth daily. Pt is on PAP 90 tablet 3   No current facility-administered medications for this visit.     Musculoskeletal: Strength & Muscle Tone: Not able to assess due to telemed visit Gait & Station: Not able to assess due to telemed visit Patient leans: Not able to assess due to telemed visit  Psychiatric Specialty Exam: Review of Systems  Psychiatric/Behavioral: Negative for dysphoric mood, hallucinations and suicidal ideas. The patient is not nervous/anxious.     There were no vitals  taken for this visit.There is no height or weight on file to calculate BMI.  General Appearance: Not able to assess due to telemed visit  Eye Contact:  Not able to assess due to telemed visit  Speech:  Clear and Coherent and Normal Rate  Volume:  Normal  Mood:  Euthymic  Affect:  Appropriate and Congruent  Thought Process:  Coherent and Goal Directed  Orientation:  Full (Time, Place, and Person)  Thought Content: Logical   Suicidal Thoughts:  No  Homicidal Thoughts:  No  Memory:  Immediate;   Good Recent;   Good Remote;   Good  Judgement:  Good  Insight:  Good  Psychomotor Activity:  Normal  Concentration:  Concentration: Good and Attention Span: Good  Recall:  Good  Fund of Knowledge: Good  Language: Good  Akathisia:  NA  Handed:  Right  AIMS (if indicated): not done  Assets:  Communication Skills Desire for Improvement Financial Resources/Insurance Housing Social Support Transportation  ADL's:  Intact  Cognition: WNL  Sleep:  Fair   Screenings: GAD-7     Office Visit from 04/07/2020 in [facility] Office Visit from 09/23/2019 in [facility]  Total GAD-7 Score 0 0    PHQ2-9     Clinical Support from 05/13/2020 in [facility] Office Visit from 04/07/2020 in [facility] Office Visit from 09/23/2019 in [facility] Office Visit from 03/20/2019 in [facility] Office Visit from 03/05/2018 in [facility]  PHQ-2 Total Score 0 0 0 0 2  PHQ-9 Total Score -- 4 2 0 4       Assessment and Plan:  KRITHI BRAY is a 59 year old female with a past medical history significant for recurrent major depressive disorder (infull remission), generalized anxiety disorder, and primary insomnia who presents to [facility] for follow up and medication management. Patient is currently taking Trintellix 20 mg 1 x daily for the management of her depression and anxiety. Patient states that she has been doing well since the dosage adjustment.  Patient reports that her sleep has been better and is comfortable not being placed on any medications for the management of her sleep disturbances.  Patient does not need any refills at this time.  1. Recurrent major depressive disorder, in full remission ([place]) Patient to continue on Trintellix 20 mg 1 x daily  2. Generalized anxiety disorder Patient to continue on Trintellix 20 mg 1 x daily  3. Primary insomnia Patient reports that her sleep has been better and is comfortable not being placed on any medications for the management of her sleep disturbances.   Patient to follow up in 3 months  Malachy Mood, PA 06/20/2020, 10:12 PM

## 2020-06-21 ENCOUNTER — Encounter (HOSPITAL_COMMUNITY): Payer: Self-pay | Admitting: Physician Assistant

## 2020-07-03 ENCOUNTER — Other Ambulatory Visit: Payer: Self-pay | Admitting: Family Medicine

## 2020-07-03 DIAGNOSIS — I10 Essential (primary) hypertension: Secondary | ICD-10-CM

## 2020-10-01 ENCOUNTER — Telehealth (HOSPITAL_COMMUNITY): Payer: Self-pay | Admitting: *Deleted

## 2020-10-01 NOTE — Telephone Encounter (Signed)
Received Help at Hand paperwork patient faxed to writer for Dr Ronne Binning to complete her part of the patient assistance application for her Trintellix and writer sent it back to her per [employer].

## 2020-10-05 ENCOUNTER — Ambulatory Visit (INDEPENDENT_AMBULATORY_CARE_PROVIDER_SITE_OTHER): Payer: Medicare Other | Admitting: Family Medicine

## 2020-10-05 ENCOUNTER — Other Ambulatory Visit: Payer: Self-pay

## 2020-10-05 ENCOUNTER — Encounter: Payer: Self-pay | Admitting: Family Medicine

## 2020-10-05 VITALS — BP 124/78 | HR 82 | Ht 63.0 in | Wt 197.0 lb

## 2020-10-05 DIAGNOSIS — I10 Essential (primary) hypertension: Secondary | ICD-10-CM | POA: Diagnosis not present

## 2020-10-05 DIAGNOSIS — G5603 Carpal tunnel syndrome, bilateral upper limbs: Secondary | ICD-10-CM

## 2020-10-05 DIAGNOSIS — M659 Synovitis and tenosynovitis, unspecified: Secondary | ICD-10-CM

## 2020-10-05 DIAGNOSIS — M67431 Ganglion, right wrist: Secondary | ICD-10-CM | POA: Diagnosis not present

## 2020-10-05 MED ORDER — LOSARTAN POTASSIUM-HCTZ 100-25 MG PO TABS: 1.0000 | ORAL_TABLET | Freq: Every day | ORAL | 1 refills | Status: DC

## 2020-10-05 MED ORDER — MELOXICAM 15 MG PO TABS: 15.0000 mg | ORAL_TABLET | Freq: Every day | ORAL | 0 refills | Status: DC

## 2020-10-05 NOTE — Progress Notes (Signed)
Date:  10/05/2020   Name:  Barbara Durham   DOB:  04/18/1961   MRN:  [id number]   Chief Complaint: Hypertension and Wrist Pain (Both wrists hurt by Rt is worse. Painful- 10. No injury that she can think of. Gradually starting in the last few months. 2 weeks ago was when it became worse. Concerned of carpel tunnel since fingers are going numb.)  Hypertension This is a chronic problem. The current episode started more than 1 year ago. The problem has been gradually improving since onset. The problem is controlled. Pertinent negatives include no anxiety, blurred vision, chest pain, headaches, malaise/fatigue, neck pain, orthopnea, palpitations, peripheral edema, PND, shortness of breath or sweats. There are no associated agents to hypertension. Past treatments include angiotensin blockers and diuretics. The current treatment provides moderate improvement. There are no compliance problems.  There is no history of angina, kidney disease, CAD/MI, CVA, heart failure, left ventricular hypertrophy, PVD or retinopathy. There is no history of chronic renal disease, a hypertension causing med or renovascular disease.  Wrist Pain  The pain is present in the right wrist and left wrist. This is a chronic problem. The current episode started more than 1 month ago (9-10 months/worse last 2 weeks). There has been no history of extremity trauma. The problem occurs constantly. The problem has been waxing and waning. The quality of the pain is described as aching. Pertinent negatives include no fever, inability to bear weight or numbness. Associated symptoms comments: Paresthesia medial nerve holding a book. The symptoms are aggravated by activity. She has tried nothing (cream) for the symptoms. The treatment provided no relief.    Lab Results  Component Value Date   CREATININE 1.05 (H) 04/07/2020   BUN 14 04/07/2020   NA 139 04/07/2020   K 4.0 04/07/2020   CL 101 04/07/2020   CO2 24 04/07/2020   Lab  Results  Component Value Date   CHOL 219 (H) 09/23/2019   HDL 51 09/23/2019   LDLCALC 139 (H) 09/23/2019   TRIG 160 (H) 09/23/2019   CHOLHDL 4.2 05/15/2017   No results found for: TSH No results found for: HGBA1C Lab Results  Component Value Date   WBC 10.6 10/29/2014   HGB 13.5 10/29/2014   HCT 39.7 10/29/2014   MCV 103 (H) 10/29/2014   PLT 187 10/29/2014   Lab Results  Component Value Date   ALT 14 10/29/2014   AST 19 10/29/2014   ALKPHOS 62 10/29/2014   BILITOT 0.9 10/29/2014     Review of Systems  Constitutional: Negative.  Negative for chills, fatigue, fever, malaise/fatigue and unexpected weight change.  HENT: Negative for congestion, ear discharge, ear pain, rhinorrhea, sinus pressure, sneezing and sore throat.   Eyes: Negative for blurred vision, photophobia, pain, discharge, redness and itching.  Respiratory: Negative for cough, shortness of breath, wheezing and stridor.   Cardiovascular: Negative for chest pain, palpitations, orthopnea and PND.  Gastrointestinal: Negative for abdominal pain, blood in stool, constipation, diarrhea, nausea and vomiting.  Endocrine: Negative for cold intolerance, heat intolerance, polydipsia, polyphagia and polyuria.  Genitourinary: Negative for dysuria, flank pain, frequency, hematuria, menstrual problem, pelvic pain, urgency, vaginal bleeding and vaginal discharge.  Musculoskeletal: Negative for arthralgias, back pain, myalgias and neck pain.  Skin: Negative for rash.  Allergic/Immunologic: Negative for environmental allergies and food allergies.  Neurological: Negative for dizziness, weakness, light-headedness, numbness and headaches.  Hematological: Negative for adenopathy. Does not bruise/bleed easily.  Psychiatric/Behavioral: Negative for dysphoric mood. The patient is  not nervous/anxious.     Patient Active Problem List   Diagnosis Date Noted  . Primary insomnia 03/19/2020  . Recurrent major depressive disorder, in full  remission ([place]) 08/06/2019  . MDD (major depressive disorder), recurrent episode, moderate ([place]) 05/13/2019  . Generalized anxiety disorder 05/13/2019  . Hypertension 01/21/2015  . Anxiety and depression 01/21/2015  . Back ache 06/14/2013  . Lumbar transverse process fracture ([place]) 06/14/2013    No Known Allergies  Past Surgical History:  Procedure Laterality Date  . arm fracture Left   . COLONOSCOPY  2012   normal  . CYSTOSCOPY  11/2011  . DENTAL SURGERY    . FOOT SURGERY Bilateral   . LAPAROSCOPY  04/10/1997  . LSO  1998   with LOA  . OOPHORECTOMY    . ORIF ORBITAL FRACTURE Left 01/16/2018   Procedure: OPEN REDUCTION INTERNAL FIXATION ORBITAL BLOW OUT FRACTURE;  Surgeon: Clyde Canterbury, MD;  Location: [facility];  Service: ENT;  Laterality: Left;  LEIBINGER SET    WIRE MESH  . TOTAL ABDOMINAL HYSTERECTOMY  1993   with RSO  . TUBAL LIGATION  1989    Social History   Tobacco Use  . Smoking status: Never Smoker  . Smokeless tobacco: Never Used  Vaping Use  . Vaping Use: Never used  Substance Use Topics  . Alcohol use: No    Alcohol/week: 0.0 - 1.0 standard drinks    Comment: social  . Drug use: No     Medication list has been reviewed and updated.  Current Meds  Medication Sig  . albuterol (PROVENTIL HFA;VENTOLIN HFA) 108 (90 Base) MCG/ACT inhaler Inhale 2 puffs into the lungs every 6 (six) hours as needed for wheezing or shortness of breath.  . losartan-hydrochlorothiazide (HYZAAR) 100-25 MG tablet TAKE 1 TABLET BY MOUTH DAILY  . vortioxetine HBr (TRINTELLIX) 20 MG TABS tablet Take 1 tablet (20 mg total) by mouth daily. Pt is on PAP    PHQ 2/9 Scores 10/05/2020 05/13/2020 04/07/2020 09/23/2019  PHQ - 2 Score 0 0 0 0  PHQ- 9 Score 0 - 4 2    GAD 7 : Generalized Anxiety Score 10/05/2020 04/07/2020 09/23/2019  Nervous, Anxious, on Edge 0 0 0  Control/stop worrying 0 0 0  Worry too much - different things 0 0 0  Trouble relaxing 0 0 0  Restless 0 0 0   Easily annoyed or irritable 0 0 0  Afraid - awful might happen 0 0 0  Total GAD 7 Score 0 0 0  Anxiety Difficulty Not difficult at all - -    BP Readings from Last 3 Encounters:  10/05/20 124/78  04/07/20 118/70  09/23/19 120/68    Physical Exam Vitals and nursing note reviewed.  Constitutional:      Appearance: She is well-developed.  HENT:     Head: Normocephalic.     Right Ear: Tympanic membrane and external ear normal.     Left Ear: Tympanic membrane and external ear normal.     Nose: Nose normal.     Mouth/Throat:     Mouth: Mucous membranes are moist.  Eyes:     General: Lids are everted, no foreign bodies appreciated. No scleral icterus.       Left eye: No foreign body or hordeolum.     Conjunctiva/sclera: Conjunctivae normal.     Right eye: Right conjunctiva is not injected.     Left eye: Left conjunctiva is not injected.     Pupils: Pupils  are equal, round, and reactive to light.  Neck:     Thyroid: No thyromegaly.     Vascular: No JVD.     Trachea: No tracheal deviation.  Cardiovascular:     Rate and Rhythm: Normal rate and regular rhythm.     Heart sounds: Normal heart sounds, S1 normal and S2 normal. No murmur heard.  No systolic murmur is present.  No diastolic murmur is present. No friction rub. No gallop. No S3 or S4 sounds.   Pulmonary:     Effort: Pulmonary effort is normal. No respiratory distress.     Breath sounds: Normal breath sounds. No decreased breath sounds, wheezing, rhonchi or rales.  Abdominal:     General: Bowel sounds are normal.     Palpations: Abdomen is soft. There is no mass.     Tenderness: There is no abdominal tenderness. There is no guarding or rebound.  Musculoskeletal:        General: Normal range of motion.     Right wrist: Tenderness present.     Left wrist: Tenderness present.     Cervical back: Normal range of motion and neck supple.     Comments: Tinel positive bilateral/phelen negative/palpable ganglion cyst right   Lymphadenopathy:     Cervical: No cervical adenopathy.  Skin:    General: Skin is warm.     Findings: No rash.  Neurological:     Mental Status: She is alert and oriented to person, place, and time.     Cranial Nerves: No cranial nerve deficit.     Deep Tendon Reflexes: Reflexes normal.  Psychiatric:        Mood and Affect: Mood is not anxious or depressed.     Wt Readings from Last 3 Encounters:  10/05/20 197 lb (89.4 kg)  04/07/20 194 lb (88 kg)  09/23/19 205 lb (93 kg)    BP 124/78   Pulse 82   Ht 5\' 3"  (1.6 m)   Wt 197 lb (89.4 kg)   SpO2 97%   BMI 34.90 kg/m   Assessment and Plan:  1. Essential hypertension Chronic.  Controlled.  Stable.  Blood pressure is 124/78.  Continuance of losartan-hydrochlorothiazide 100-25 mg once a day.  Will recheck in 6 months. - losartan-hydrochlorothiazide (HYZAAR) 100-25 MG tablet; Take 1 tablet by mouth daily.  Dispense: 90 tablet; Refill: 1  2. Bilateral carpal tunnel syndrome New onset.  Persistent.  Unstable due to continued symptoms despite topical Voltaren.  Patient's exam is consistent with possible carpal tunnel but there is also a possibility of tenosynovitis.  We will initiate meloxicam 15 mg once a day and refer to sports medicine Dr. Zigmund Daniel for recheck evaluation in 2 weeks. - meloxicam (MOBIC) 15 MG tablet; Take 1 tablet (15 mg total) by mouth daily.  Dispense: 30 tablet; Refill: 0  3. Tenosynovitis of both wrists New onset.  Patient has tenderness and distribution of a tenosynovitis of the flexors involving wrist hand and fingers.  Meloxicam was initiated. - meloxicam (MOBIC) 15 MG tablet; Take 1 tablet (15 mg total) by mouth daily.  Dispense: 30 tablet; Refill: 0  4. Ganglion cyst of wrist, right There is a palpable ganglion cyst on the volar surface of the right wrist which is tender and is consistent with a ganglion cyst.  Given the mechanical nature of the cyst in the carpal tunnel area this may contribute also to  the above circumstance. - meloxicam (MOBIC) 15 MG tablet; Take 1 tablet (15 mg total) by  mouth daily.  Dispense: 30 tablet; Refill: 0

## 2020-10-08 ENCOUNTER — Encounter (HOSPITAL_COMMUNITY): Payer: Self-pay | Admitting: Psychiatry

## 2020-10-08 ENCOUNTER — Other Ambulatory Visit: Payer: Self-pay

## 2020-10-08 ENCOUNTER — Telehealth (INDEPENDENT_AMBULATORY_CARE_PROVIDER_SITE_OTHER): Payer: Medicare Other | Admitting: Psychiatry

## 2020-10-08 DIAGNOSIS — F411 Generalized anxiety disorder: Secondary | ICD-10-CM | POA: Diagnosis not present

## 2020-10-08 DIAGNOSIS — F3342 Major depressive disorder, recurrent, in full remission: Secondary | ICD-10-CM | POA: Diagnosis not present

## 2020-10-08 DIAGNOSIS — F5101 Primary insomnia: Secondary | ICD-10-CM

## 2020-10-08 MED ORDER — VORTIOXETINE HBR 20 MG PO TABS: 20.0000 mg | ORAL_TABLET | Freq: Every day | ORAL | 3 refills | Status: DC

## 2020-10-08 NOTE — Progress Notes (Signed)
[facility] MD OP Progress Note  Virtual Visit via Telephone Note  I connected with Barbara Durham on 10/08/20 at 10:40 AM EDT by telephone and verified that I am speaking with the correct person using two identifiers.  Location: Patient: home Provider: Clinic   I discussed the limitations, risks, security and privacy concerns of performing an evaluation and management service by telephone and the availability of in person appointments. I also discussed with the patient that there may be a patient responsible charge related to this service. The patient expressed understanding and agreed to proceed.   I provided 15 minutes of non-face-to-face time during this encounter.    10/08/2020 10:36 AM Barbara Durham  MRN:  [id number]  Chief Complaint:  " I am doing fine."  HPI: Patient reported she has been doing well overall.  She stated that her mood has been stable.  Her anxiety is well controlled.  She informed that she decided not to worry too much about any medication for sleep because she has realized that she is so sensitive to any new medication. She stated that she finally decided that she is going to change her bedtime and as a result she has been going to bed early.  She stated that going to bed early has helped and because of that she is able to go to bed early and get a decent time amount of sleep at night. Other than that overall things are progressing well for her and she denied any other concerns at this time.   Past meds tried: Prozac, Sertraline, Trazodone, Ambien, Sonata, Temazepam.  Visit Diagnosis: Recurrent major depressive disorder, in full remission ([place])  Generalized anxiety disorder  Primary insomnia  Past Psychiatric History: MDD, GAD  Past Medical History:  Past Medical History:  Diagnosis Date  . Anxiety   . Depression   . Endometriosis   . GERD (gastroesophageal reflux disease)   . Hemorrhoids   . Hypertension   . Nocturia    enuresis  . Osteopenia  10/2009, 6/20   in spine and hip  . PONV (postoperative nausea and vomiting)   . Stress incontinence, female     Past Surgical History:  Procedure Laterality Date  . arm fracture Left   . COLONOSCOPY  2012   normal  . CYSTOSCOPY  11/2011  . DENTAL SURGERY    . FOOT SURGERY Bilateral   . LAPAROSCOPY  04/10/1997  . LSO  1998   with LOA  . OOPHORECTOMY    . ORIF ORBITAL FRACTURE Left 01/16/2018   Procedure: OPEN REDUCTION INTERNAL FIXATION ORBITAL BLOW OUT FRACTURE;  Surgeon: Clyde Canterbury, MD;  Location: [facility];  Service: ENT;  Laterality: Left;  LEIBINGER SET    WIRE MESH  . TOTAL ABDOMINAL HYSTERECTOMY  1993   with RSO  . TUBAL LIGATION  1989    Family Psychiatric History: depression- siblings  Family History:  Family History  Problem Relation Age of Onset  . Heart disease Mother   . Hypertension Mother   . Dementia Mother   . Depression Mother   . Diabetes Mother        type 2  . Alzheimer's disease Father   . Stroke Father   . CVA Father   . Prostate cancer Father   . Heart disease Sister   . Osteoporosis Sister   . Arthritis Brother   . Depression Brother   . Depression Brother   . Gout Brother   . Hypertension Brother   .  Breast cancer Paternal Aunt        20s    Social History:  Social History   Socioeconomic History  . Marital status: Divorced    Spouse name: Not on file  . Number of children: 1  . Years of education: Not on file  . Highest education level: Not on file  Occupational History  . Not on file  Tobacco Use  . Smoking status: Never Smoker  . Smokeless tobacco: Never Used  Vaping Use  . Vaping Use: Never used  Substance and Sexual Activity  . Alcohol use: No    Alcohol/week: 0.0 - 1.0 standard drinks    Comment: social  . Drug use: No  . Sexual activity: Not Currently    Birth control/protection: None  Other Topics Concern  . Not on file  Social History Narrative   Pt lives alone   Social Determinants of Health    Financial Resource Strain: Low Risk   . Difficulty of Paying Living Expenses: Not hard at all  Food Insecurity: No Food Insecurity  . Worried About Charity fundraiser in the Last Year: Never true  . Ran Out of Food in the Last Year: Never true  Transportation Needs: No Transportation Needs  . Lack of Transportation (Medical): No  . Lack of Transportation (Non-Medical): No  Physical Activity: Inactive  . Days of Exercise per Week: 0 days  . Minutes of Exercise per Session: 0 min  Stress: No Stress Concern Present  . Feeling of Stress : Not at all  Social Connections: Socially Isolated  . Frequency of Communication with Friends and Family: More than three times a week  . Frequency of Social Gatherings with Friends and Family: More than three times a week  . Attends Religious Services: Never  . Active Member of Clubs or Organizations: No  . Attends Archivist Meetings: Never  . Marital Status: Divorced    Allergies: No Known Allergies  Metabolic Disorder Labs: No results found for: HGBA1C, MPG No results found for: PROLACTIN Lab Results  Component Value Date   CHOL 219 (H) 09/23/2019   TRIG 160 (H) 09/23/2019   HDL 51 09/23/2019   CHOLHDL 4.2 05/15/2017   LDLCALC 139 (H) 09/23/2019   LDLCALC 168 (H) 03/20/2019   No results found for: TSH  Therapeutic Level Labs: No results found for: LITHIUM No results found for: VALPROATE No components found for:  CBMZ  Current Medications: Current Outpatient Medications  Medication Sig Dispense Refill  . albuterol (PROVENTIL HFA;VENTOLIN HFA) 108 (90 Base) MCG/ACT inhaler Inhale 2 puffs into the lungs every 6 (six) hours as needed for wheezing or shortness of breath. 1 Inhaler 2  . losartan-hydrochlorothiazide (HYZAAR) 100-25 MG tablet Take 1 tablet by mouth daily. 90 tablet 1  . meloxicam (MOBIC) 15 MG tablet Take 1 tablet (15 mg total) by mouth daily. 30 tablet 0  . vortioxetine HBr (TRINTELLIX) 20 MG TABS tablet Take  1 tablet (20 mg total) by mouth daily. Pt is on PAP 90 tablet 3   No current facility-administered medications for this visit.    Musculoskeletal: Strength & Muscle Tone: unable to assess due to telemed visit Gait & Station: unable to assess due to telemed visit Patient leans: unable to assess due to telemed visit  Psychiatric Specialty Exam: ROS  There were no vitals taken for this visit.There is no height or weight on file to calculate BMI.  General Appearance: unable to assess due to phone visit  Eye Contact:  unable to assess due to phone visit  Speech:  Clear and Coherent and Normal Rate  Volume:  Normal  Mood:  Euthymic  Affect:  Congruent  Thought Process:  Goal Directed, Linear and Descriptions of Associations: Intact  Orientation:  Full (Time, Place, and Person)  Thought Content: Logical   Suicidal Thoughts:  No  Homicidal Thoughts:  No  Memory:  Recent;   Good Remote;   Good  Judgement:  Fair  Insight:  Fair  Psychomotor Activity:  Normal  Concentration:  Concentration: Good and Attention Span: Good  Recall:  Good  Fund of Knowledge: Good  Language: Good  Akathisia:  Negative  Handed:  Right  Assets:  Communication Skills Desire for Improvement Financial Resources/Insurance Housing Social Support Transportation  ADL's:  Intact  Cognition: WNL  Sleep:  Fair   Screenings: GAD-7   Flowsheet Row Office Visit from 10/05/2020 in [facility] Office Visit from 04/07/2020 in [facility] Office Visit from 09/23/2019 in [facility]  Total GAD-7 Score 0 0 0    PHQ2-9   [facility] Office Visit from 10/05/2020 in [facility] from 05/13/2020 in [facility] Office Visit from 04/07/2020 in [facility] Office Visit from 09/23/2019 in [facility] Office Visit from 03/20/2019 in [facility]  PHQ-2 Total Score 0 0 0 0 0  PHQ-9 Total Score 0 - 4 2 0       Assessment and Plan:  Patient appears to be stable on her current regimen.  She has worked on her sleep hygiene techniques and as a result is sleeping better than before.  She is no longer taking any medications for insomnia.  1. Recurrent major depressive disorder, in full remission ([place])  - vortioxetine HBr (TRINTELLIX) 20 MG TABS tablet; Take 1 tablet (20 mg total) by mouth daily. Pt is on PAP  Dispense: 90 tablet; Refill: 3  2. Generalized anxiety disorder  - vortioxetine HBr (TRINTELLIX) 20 MG TABS tablet; Take 1 tablet (20 mg total) by mouth daily. Pt is on PAP  Dispense: 90 tablet; Refill: 3  3. Primary insomnia Patient has worked on her sleep hygiene and as result is sleeping better.  Is no longer taking any scheduled/prescribed medications for that.   Patient was informed that her care is being transferred to a different provider and [place] [facility].  Patient verbalized understanding. Continue same medication regimen. Follow up in 3 months.     [place]ada Crane, MD 10/08/2020, 10:36 AM

## 2020-10-20 ENCOUNTER — Other Ambulatory Visit: Payer: Self-pay

## 2020-10-20 ENCOUNTER — Encounter: Payer: Self-pay | Admitting: Family Medicine

## 2020-10-20 ENCOUNTER — Telehealth (HOSPITAL_COMMUNITY): Payer: Self-pay | Admitting: *Deleted

## 2020-10-20 ENCOUNTER — Ambulatory Visit (INDEPENDENT_AMBULATORY_CARE_PROVIDER_SITE_OTHER): Payer: Medicare Other | Admitting: Family Medicine

## 2020-10-20 VITALS — BP 124/72 | HR 71 | Temp 97.9°F | Ht 63.0 in | Wt 199.4 lb

## 2020-10-20 DIAGNOSIS — M1811 Unilateral primary osteoarthritis of first carpometacarpal joint, right hand: Secondary | ICD-10-CM | POA: Diagnosis not present

## 2020-10-20 DIAGNOSIS — M67431 Ganglion, right wrist: Secondary | ICD-10-CM | POA: Diagnosis not present

## 2020-10-20 MED ORDER — TRIAMCINOLONE ACETONIDE 40 MG/ML IJ SUSP
40.0000 mg | Freq: Once | INTRAMUSCULAR | Status: AC
  Administered 2020-10-20: 40 mg via INTRA_ARTICULAR

## 2020-10-20 NOTE — Telephone Encounter (Signed)
Call from patient yesterday saying she is a week from being out of her Trintellex and we didn't fill the paperwork out completely for her patient assistance thru [employer]. Completed needed form and faxed it to [facility] , [employer]. Left a message for patient to inform of this.

## 2020-10-20 NOTE — Assessment & Plan Note (Signed)
Patient with clinical and sonographic features consistent with a volar ganglion cyst, this does represent an element of her pain however this is minor and secondary when compared to her first [facility].  We can monitor this area and if persistently symptomatic can discuss ultrasound aspiration of the site.

## 2020-10-20 NOTE — Assessment & Plan Note (Signed)
Clinical and radiographic features show focality to the patient's right first [facility] where she is acutely tender, positive basilar grind, provocative testing otherwise benign.  I have reviewed the same as well as treatment strategies and she did elect to proceed with ultrasound guided intra-articular first [place] cortisone injection.  Postinjection care outlined as well as timeline for symptom response.  She was encouraged to continue meloxicam for the next 2-3 days then transition to a as needed dosing regimen.  For any suboptimal progress at or beyond the 2-week mark, plan for new x-ray right hand, follow-up as clinically dictated.

## 2020-10-20 NOTE — Patient Instructions (Addendum)
-  You received a cortisone injection to the base of the right thumb today -Apply ice at 20-minute intervals when home, before bed, and as needed -Continue meloxicam for the next 2-3 days then transition to as needed dosing -Contact us at the 2-week mark or beyond for any lingering symptoms -Otherwise contact for questions and follow-up as needed

## 2020-10-20 NOTE — Progress Notes (Signed)
New Patient Office Visit  Subjective:  Patient ID: Barbara Durham, female    DOB: 02/25/1961  Age: 60 y.o. MRN: [id number]  CC:  Chief Complaint  Patient presents with  . New Patient (Initial Visit)  . Wrist Pain    Right; several months, became more severe within the past month; intermittent; worse upon extension; small cyst on the radial side of wrist; no known injury; right-handedness; some relief with meloxicam 15 mg daily and heating pad at night; uses a wrist brace for stabilization, but does not help with pain relief;  8/10 pain    HPI Barbara Durham presents for right rist pain, RHD patient, details per HPI  Past Medical History:  Diagnosis Date  . Anxiety   . Depression   . Endometriosis   . GERD (gastroesophageal reflux disease)   . Hemorrhoids   . Hypertension   . Nocturia    enuresis  . Osteopenia 10/2009, 6/20   in spine and hip  . PONV (postoperative nausea and vomiting)   . Stress incontinence, female     Past Surgical History:  Procedure Laterality Date  . arm fracture Left   . COLONOSCOPY  2012   normal  . CYSTOSCOPY  11/2011  . DENTAL SURGERY    . FOOT SURGERY Bilateral   . LAPAROSCOPY  04/10/1997  . LSO  1998   with LOA  . OOPHORECTOMY    . ORIF ORBITAL FRACTURE Left 01/16/2018   Procedure: OPEN REDUCTION INTERNAL FIXATION ORBITAL BLOW OUT FRACTURE;  Surgeon: Clyde Canterbury, MD;  Location: [facility];  Service: ENT;  Laterality: Left;  LEIBINGER SET    WIRE MESH  . TOTAL ABDOMINAL HYSTERECTOMY  1993   with RSO  . TUBAL LIGATION  1989    Family History  Problem Relation Age of Onset  . Heart disease Mother   . Hypertension Mother   . Dementia Mother   . Depression Mother   . Diabetes Mother        type 2  . Alzheimer's disease Father   . Stroke Father   . CVA Father   . Prostate cancer Father   . Heart disease Sister   . Osteoporosis Sister   . Arthritis Brother   . Depression Brother   . Depression Brother   .  Gout Brother   . Hypertension Brother   . Breast cancer Paternal Aunt        77s    Social History   Socioeconomic History  . Marital status: Divorced    Spouse name: Not on file  . Number of children: 1  . Years of education: Not on file  . Highest education level: Not on file  Occupational History  . Not on file  Tobacco Use  . Smoking status: Never Smoker  . Smokeless tobacco: Never Used  Vaping Use  . Vaping Use: Never used  Substance and Sexual Activity  . Alcohol use: No    Alcohol/week: 0.0 - 1.0 standard drinks    Comment: social  . Drug use: No  . Sexual activity: Not Currently    Birth control/protection: None  Other Topics Concern  . Not on file  Social History Narrative   Pt lives alone   Social Determinants of Health   Financial Resource Strain: Low Risk   . Difficulty of Paying Living Expenses: Not hard at all  Food Insecurity: No Food Insecurity  . Worried About Charity fundraiser in the Last Year: Never  true  . Ran Out of Food in the Last Year: Never true  Transportation Needs: No Transportation Needs  . Lack of Transportation (Medical): No  . Lack of Transportation (Non-Medical): No  Physical Activity: Inactive  . Days of Exercise per Week: 0 days  . Minutes of Exercise per Session: 0 min  Stress: No Stress Concern Present  . Feeling of Stress : Not at all  Social Connections: Socially Isolated  . Frequency of Communication with Friends and Family: More than three times a week  . Frequency of Social Gatherings with Friends and Family: More than three times a week  . Attends Religious Services: Never  . Active Member of Clubs or Organizations: No  . Attends Archivist Meetings: Never  . Marital Status: Divorced  Human resources officer Violence: Not At Risk  . Fear of Current or Ex-Partner: No  . Emotionally Abused: No  . Physically Abused: No  . Sexually Abused: No    ROS Review of Systems per HPI  Objective:   Today's Vitals: BP  124/72 (BP Location: Right Arm, Patient Position: Sitting, Cuff Size: Normal)   Pulse 71   Temp 97.9 F (36.6 C) (Oral)   Ht 5\' 3"  (1.6 m)   Wt 199 lb 6.4 oz (90.4 kg)   SpO2 98%   BMI 35.32 kg/m   Physical Exam details in A&P  Independent interpretation of right wrist complete x-rays dated 12/19/2015 from [facility] Radiology reveals scattered degenerative changes, mild in nature primarily focal to the distal radius carpal junction, secondarily to the first [facility], no acute osseous abnormality noted Procedure:  Injection of right first [facility] under ultrasound guidance Consent obtained and verified. Time-out conducted. Noted no overlying erythema, induration, or other signs of local infection. Skin prepped in a sterile fashion. Topical analgesic spray: Ethyl chloride. Completed without difficulty. Meds: Kenalog 40mg /mL 0.13mL total Advised to call if fevers/chills, erythema, induration, drainage, or persistent bleeding.     Assessment & Plan:   Problem List Items Addressed This Visit      Musculoskeletal and Integument   Primary osteoarthritis of first carpometacarpal joint of right hand - Primary    Clinical and radiographic features show focality to the patient's right first [facility] where she is acutely tender, positive basilar grind, provocative testing otherwise benign.  I have reviewed the same as well as treatment strategies and she did elect to proceed with ultrasound guided intra-articular first [place] cortisone injection.  Postinjection care outlined as well as timeline for symptom response.  She was encouraged to continue meloxicam for the next 2-3 days then transition to a as needed dosing regimen.  For any suboptimal progress at or beyond the 2-week mark, plan for new x-ray right hand, follow-up as clinically dictated.      Relevant Medications   triamcinolone acetonide (KENALOG-40) injection 40 mg     Other   Ganglion cyst of volar aspect of right wrist    Patient with clinical  and sonographic features consistent with a volar ganglion cyst, this does represent an element of her pain however this is minor and secondary when compared to her first [place].  We can monitor this area and if persistently symptomatic can discuss ultrasound aspiration of the site.         Outpatient Encounter Medications as of 10/20/2020  Medication Sig  . albuterol (PROVENTIL HFA;VENTOLIN HFA) 108 (90 Base) MCG/ACT inhaler Inhale 2 puffs into the lungs every 6 (six) hours as needed for wheezing or shortness of breath.  Marland Kitchen  losartan-hydrochlorothiazide (HYZAAR) 100-25 MG tablet Take 1 tablet by mouth daily.  . meloxicam (MOBIC) 15 MG tablet Take 1 tablet (15 mg total) by mouth daily.  Marland Kitchen vortioxetine HBr (TRINTELLIX) 20 MG TABS tablet Take 1 tablet (20 mg total) by mouth daily. Pt is on PAP   Facility-Administered Encounter Medications as of 10/20/2020  Medication  . triamcinolone acetonide (KENALOG-40) injection 40 mg    Follow-up: Return if symptoms worsen or fail to improve.   [name], MD
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
## 2021-01-11 ENCOUNTER — Other Ambulatory Visit: Payer: Self-pay

## 2021-01-11 ENCOUNTER — Encounter: Payer: Self-pay | Admitting: Psychiatry

## 2021-01-11 ENCOUNTER — Telehealth (INDEPENDENT_AMBULATORY_CARE_PROVIDER_SITE_OTHER): Payer: Medicare Other | Admitting: Psychiatry

## 2021-01-11 DIAGNOSIS — F3342 Major depressive disorder, recurrent, in full remission: Secondary | ICD-10-CM | POA: Diagnosis not present

## 2021-01-11 DIAGNOSIS — F4310 Post-traumatic stress disorder, unspecified: Secondary | ICD-10-CM

## 2021-02-16 ENCOUNTER — Telehealth: Payer: Self-pay | Admitting: Family Medicine

## 2021-02-16 NOTE — Telephone Encounter (Signed)
Pt calling stating that she is needing to have her medication, meloxicam, increased. She states that she is having a lot of pain in her hand. Pt states that she is completely out of the medication, but that she is needing to have a higher dose. Please advise.       [facility] [place], [place] AT [facility]  [address] [place] [address]  Phone: [phone] Fax: [phone]  Hours: Not open 24 hours

## 2021-02-16 NOTE — Telephone Encounter (Signed)
I took a look at her visit note, if she is still having pain, she needs a right hand x-ray and a follow-up visit with me (40 minutes)

## 2021-02-17 ENCOUNTER — Other Ambulatory Visit: Payer: Self-pay

## 2021-02-17 DIAGNOSIS — M1811 Unilateral primary osteoarthritis of first carpometacarpal joint, right hand: Secondary | ICD-10-CM

## 2021-02-17 NOTE — Progress Notes (Signed)
Dg right hand ordered

## 2021-02-22 ENCOUNTER — Ambulatory Visit (INDEPENDENT_AMBULATORY_CARE_PROVIDER_SITE_OTHER): Payer: Medicare Other | Admitting: Family Medicine

## 2021-02-22 ENCOUNTER — Inpatient Hospital Stay (INDEPENDENT_AMBULATORY_CARE_PROVIDER_SITE_OTHER): Payer: Medicare Other | Admitting: Radiology

## 2021-02-22 ENCOUNTER — Encounter: Payer: Self-pay | Admitting: Family Medicine

## 2021-02-22 ENCOUNTER — Ambulatory Visit
Admission: RE | Admit: 2021-02-22 | Discharge: 2021-02-22 | Disposition: A | Discharge status: Home / Self Care | Payer: Medicare Other | Source: Ambulatory Visit | Attending: Family Medicine | Admitting: Family Medicine

## 2021-02-22 ENCOUNTER — Other Ambulatory Visit: Payer: Self-pay

## 2021-02-22 ENCOUNTER — Ambulatory Visit
Admission: RE | Admit: 2021-02-22 | Discharge: 2021-02-22 | Disposition: A | Discharge status: Home / Self Care | Payer: Medicare Other | Attending: Family Medicine | Admitting: Family Medicine

## 2021-02-22 VITALS — BP 116/68 | HR 76 | Temp 97.6°F | Ht 63.0 in | Wt 193.0 lb

## 2021-02-22 DIAGNOSIS — M79641 Pain in right hand: Secondary | ICD-10-CM | POA: Diagnosis not present

## 2021-02-22 DIAGNOSIS — M1811 Unilateral primary osteoarthritis of first carpometacarpal joint, right hand: Secondary | ICD-10-CM | POA: Insufficient documentation

## 2021-02-22 MED ORDER — TRIAMCINOLONE ACETONIDE 40 MG/ML IJ SUSP: 40.0000 mg | Freq: Once | INTRAMUSCULAR | Status: DC

## 2021-02-22 MED ORDER — DICLOFENAC SODIUM 2 % EX SOLN: 1.0000 | Freq: Two times a day (BID) | CUTANEOUS | 0 refills | Status: DC | PRN

## 2021-02-22 MED ORDER — TRIAMCINOLONE ACETONIDE 40 MG/ML IJ SUSP
40.0000 mg | Freq: Once | INTRAMUSCULAR | Status: AC
  Administered 2021-02-22: 40 mg
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
## 2021-02-22 NOTE — Patient Instructions (Addendum)
You have just been given a cortisone injection to reduce pain and inflammation. After the injection you may notice immediate relief of pain as a result of the Lidocaine. It is important to rest the area of the injection for 24 to 48 hours after the injection. There is a possibility of some temporary increased discomfort and swelling for up to 72 hours until the cortisone begins to work. If you do have pain, simply rest the joint and use ice. If you can tolerate over the counter medications, you can try Tylenol, Aleve, or Advil for added relief per package instructions. - Can use topical Pennsaid every 12 hours as-needed to unbroken skin - PT coordinator will contact you to schedule hand therapy [facility]:  [place]:  [place]: [phone]  - Return in 3 months

## 2021-02-22 NOTE — Addendum Note (Signed)
Addended by: Forbes Cellar on: 02/22/2021 02:28 PM   Modules accepted: Orders
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
## 2021-02-23 ENCOUNTER — Other Ambulatory Visit: Payer: Self-pay | Admitting: Family Medicine

## 2021-02-23 DIAGNOSIS — M1811 Unilateral primary osteoarthritis of first carpometacarpal joint, right hand: Secondary | ICD-10-CM

## 2021-02-23 MED ORDER — DICLOFENAC SODIUM 2 % EX SOLN
1.0000 | Freq: Two times a day (BID) | CUTANEOUS | 0 refills | Status: DC | PRN
Start: 2021-02-23 — End: 2023-07-06

## 2021-02-23 NOTE — Telephone Encounter (Unsigned)
Copied from [place] [id number]. Topic: Quick Communication - Rx Refill/Question >> Feb 23, 2021  3:57 PM Yvette Rack wrote: Pt stated she received a call regarding Rx but she does not use that pharmacy. Pt requests that the Rx be sent to her local pharmacy  Medication: Diclofenac Sodium 2 % SOLN  Has the patient contacted their pharmacy? No. (Agent: If no, request that the patient contact the pharmacy for the refill.) (Agent: If yes, when and what did the pharmacy advise?)  Preferred Pharmacy (with phone number or street name): [facility] [id number], [place] [facility]    Phone: [phone]   Fax: [phone]  Agent: Please be advised that RX refills may take up to 3 business days. We ask that you follow-up with your pharmacy.

## 2021-02-23 NOTE — Telephone Encounter (Signed)
Filled 02/22/21

## 2021-03-01 ENCOUNTER — Encounter: Payer: Self-pay | Admitting: Occupational Therapy

## 2021-03-01 ENCOUNTER — Ambulatory Visit: Payer: Medicare Other | Attending: Family Medicine | Admitting: Occupational Therapy

## 2021-03-01 DIAGNOSIS — M6281 Muscle weakness (generalized): Secondary | ICD-10-CM | POA: Diagnosis not present

## 2021-03-01 DIAGNOSIS — M79641 Pain in right hand: Secondary | ICD-10-CM | POA: Diagnosis not present

## 2021-03-01 DIAGNOSIS — M25641 Stiffness of right hand, not elsewhere classified: Secondary | ICD-10-CM | POA: Insufficient documentation
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
## 2021-03-15 ENCOUNTER — Ambulatory Visit: Payer: Medicare Other | Admitting: Occupational Therapy

## 2021-03-15 DIAGNOSIS — M25641 Stiffness of right hand, not elsewhere classified: Secondary | ICD-10-CM | POA: Diagnosis not present

## 2021-03-15 DIAGNOSIS — M79641 Pain in right hand: Secondary | ICD-10-CM

## 2021-03-15 DIAGNOSIS — M6281 Muscle weakness (generalized): Secondary | ICD-10-CM | POA: Diagnosis not present
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
## 2021-03-23 ENCOUNTER — Telehealth (INDEPENDENT_AMBULATORY_CARE_PROVIDER_SITE_OTHER): Payer: Medicare Other | Admitting: Psychiatry

## 2021-03-23 ENCOUNTER — Other Ambulatory Visit: Payer: Self-pay

## 2021-03-23 ENCOUNTER — Encounter: Payer: Self-pay | Admitting: Psychiatry

## 2021-03-23 DIAGNOSIS — F3342 Major depressive disorder, recurrent, in full remission: Secondary | ICD-10-CM | POA: Diagnosis not present

## 2021-03-23 DIAGNOSIS — F4310 Post-traumatic stress disorder, unspecified: Secondary | ICD-10-CM | POA: Diagnosis not present

## 2021-03-23 NOTE — Patient Instructions (Signed)
Continue Trintellix 20 mg every day.  Next appointment- 11/28 at 9 AM, in person   The next visit will be in person visit. Please arrive 15 mins before the scheduled time.   [facility]  Address: [address], [place], [place] [address]

## 2021-03-29 ENCOUNTER — Telehealth: Payer: Medicare Other | Admitting: Psychiatry

## 2021-04-01 ENCOUNTER — Other Ambulatory Visit: Payer: Self-pay | Admitting: Family Medicine

## 2021-04-01 DIAGNOSIS — I10 Essential (primary) hypertension: Secondary | ICD-10-CM

## 2021-04-07 ENCOUNTER — Ambulatory Visit (INDEPENDENT_AMBULATORY_CARE_PROVIDER_SITE_OTHER): Payer: Medicare Other | Admitting: Family Medicine

## 2021-04-07 ENCOUNTER — Encounter: Payer: Self-pay | Admitting: Family Medicine

## 2021-04-07 ENCOUNTER — Other Ambulatory Visit: Payer: Self-pay

## 2021-04-07 VITALS — BP 100/80 | HR 68 | Ht 63.0 in | Wt 191.0 lb

## 2021-04-07 DIAGNOSIS — J4521 Mild intermittent asthma with (acute) exacerbation: Secondary | ICD-10-CM | POA: Diagnosis not present

## 2021-04-07 DIAGNOSIS — Z23 Encounter for immunization: Secondary | ICD-10-CM

## 2021-04-07 DIAGNOSIS — I10 Essential (primary) hypertension: Secondary | ICD-10-CM | POA: Diagnosis not present

## 2021-04-07 DIAGNOSIS — E782 Mixed hyperlipidemia: Secondary | ICD-10-CM | POA: Diagnosis not present

## 2021-04-07 MED ORDER — ALBUTEROL SULFATE HFA 108 (90 BASE) MCG/ACT IN AERS: 2.0000 | INHALATION_SPRAY | Freq: Four times a day (QID) | RESPIRATORY_TRACT | 11 refills | Status: DC | PRN

## 2021-04-07 MED ORDER — LOSARTAN POTASSIUM-HCTZ 100-25 MG PO TABS: 1.0000 | ORAL_TABLET | Freq: Every day | ORAL | 1 refills | Status: DC

## 2021-04-07 NOTE — Progress Notes (Signed)
Date:  04/07/2021   Name:  Barbara Durham   DOB:  Dec 15, 1960   MRN:  [id number]   Chief Complaint: Hypertension and Flu Vaccine  Hypertension This is a chronic problem. The current episode started more than 1 year ago. The problem has been gradually improving since onset. The problem is controlled. Pertinent negatives include no anxiety, blurred vision, chest pain, headaches, malaise/fatigue, neck pain, orthopnea, palpitations, peripheral edema, PND, shortness of breath or sweats. There are no associated agents to hypertension. There are no known risk factors for coronary artery disease. Past treatments include angiotensin blockers and diuretics. The current treatment provides moderate improvement. There are no compliance problems.  There is no history of angina, kidney disease, CAD/MI, CVA, heart failure, left ventricular hypertrophy, PVD or retinopathy. There is no history of chronic renal disease, a hypertension causing med or renovascular disease.  Hyperlipidemia This is a chronic problem. The problem is uncontrolled. Recent lipid tests were reviewed and are variable. She has no history of chronic renal disease. Pertinent negatives include no chest pain, myalgias or shortness of breath. Current antihyperlipidemic treatment includes diet change.  Asthma There is no cough, shortness of breath or wheezing. Pertinent negatives include no chest pain, ear pain, fever, headaches, malaise/fatigue, myalgias, PND, sore throat or sweats. Her symptoms are alleviated by beta-agonist. She reports minimal improvement on treatment. Her past medical history is significant for asthma.   Lab Results  Component Value Date   CREATININE 1.05 (H) 04/07/2020   BUN 14 04/07/2020   NA 139 04/07/2020   K 4.0 04/07/2020   CL 101 04/07/2020   CO2 24 04/07/2020   Lab Results  Component Value Date   CHOL 219 (H) 09/23/2019   HDL 51 09/23/2019   LDLCALC 139 (H) 09/23/2019   TRIG 160 (H) 09/23/2019   CHOLHDL  4.2 05/15/2017   No results found for: TSH No results found for: HGBA1C Lab Results  Component Value Date   WBC 10.6 10/29/2014   HGB 13.5 10/29/2014   HCT 39.7 10/29/2014   MCV 103 (H) 10/29/2014   PLT 187 10/29/2014   Lab Results  Component Value Date   ALT 14 10/29/2014   AST 19 10/29/2014   ALKPHOS 62 10/29/2014   BILITOT 0.9 10/29/2014     Review of Systems  Constitutional:  Negative for chills, fever and malaise/fatigue.  HENT:  Negative for drooling, ear discharge, ear pain and sore throat.   Eyes:  Negative for blurred vision.  Respiratory:  Negative for cough, shortness of breath and wheezing.   Cardiovascular:  Negative for chest pain, palpitations, orthopnea, leg swelling and PND.  Gastrointestinal:  Negative for abdominal pain, blood in stool, constipation, diarrhea and nausea.  Endocrine: Negative for polydipsia.  Genitourinary:  Negative for dysuria, frequency, hematuria and urgency.  Musculoskeletal:  Negative for back pain, myalgias and neck pain.  Skin:  Negative for rash.  Allergic/Immunologic: Negative for environmental allergies.  Neurological:  Negative for dizziness and headaches.  Hematological:  Does not bruise/bleed easily.  Psychiatric/Behavioral:  Negative for suicidal ideas. The patient is not nervous/anxious.    Patient Active Problem List   Diagnosis Date Noted   Ganglion cyst of volar aspect of right wrist 10/20/2020   Primary osteoarthritis of first carpometacarpal joint of right hand 10/20/2020   Primary insomnia 03/19/2020   Hypertension 01/21/2015   Back ache 06/14/2013   Lumbar transverse process fracture ([place]) 06/14/2013    No Known Allergies  Past Surgical History:  Procedure  Laterality Date   arm fracture Left    COLONOSCOPY  2012   normal   CYSTOSCOPY  11/2011   DENTAL SURGERY     FOOT SURGERY Bilateral    LAPAROSCOPY  04/10/1997   LSO  1998   with LOA   OOPHORECTOMY     ORIF ORBITAL FRACTURE Left 01/16/2018    Procedure: OPEN REDUCTION INTERNAL FIXATION ORBITAL BLOW OUT FRACTURE;  Surgeon: Clyde Canterbury, MD;  Location: [facility];  Service: ENT;  Laterality: Left;  LEIBINGER SET    [place]   with [place]    Social History   Tobacco Use   Smoking status: Never   Smokeless tobacco: Never  Vaping Use   Vaping Use: Never used  Substance Use Topics   Alcohol use: Not Currently    Alcohol/week: 0.0 - 1.0 standard drinks   Drug use: No     Medication list has been reviewed and updated.  Current Meds  Medication Sig   albuterol (PROVENTIL HFA;VENTOLIN HFA) 108 (90 Base) MCG/ACT inhaler Inhale 2 puffs into the lungs every 6 (six) hours as needed for wheezing or shortness of breath.   Aspirin-Caffeine (BC FAST PAIN RELIEF ARTHRITIS PO) Take 1 packet by mouth in the morning and at bedtime.   Diclofenac Sodium 2 % SOLN Place 1 spray onto the skin 2 (two) times daily as needed.   losartan-hydrochlorothiazide (HYZAAR) 100-25 MG tablet TAKE 1 TABLET BY MOUTH DAILY   naproxen sodium (ALEVE) 220 MG tablet Take 880 mg by mouth daily as needed.   vortioxetine HBr (TRINTELLIX) 20 MG TABS tablet Take 1 tablet (20 mg total) by mouth daily. Pt is on PAP    PHQ 2/9 Scores 04/07/2021 02/22/2021 10/20/2020 10/05/2020  PHQ - 2 Score 0 0 0 0  PHQ- 9 Score 0 0 0 0  Some encounter information is confidential and restricted. Go to Review Flowsheets activity to see all data.    GAD 7 : Generalized Anxiety Score 04/07/2021 02/22/2021 10/20/2020 10/05/2020  Nervous, Anxious, on Edge 0 0 0 0  Control/stop worrying 0 0 0 0  Worry too much - different things 0 0 0 0  Trouble relaxing 0 0 0 0  Restless 0 0 0 0  Easily annoyed or irritable 0 0 3 0  Afraid - awful might happen 0 0 0 0  Total GAD 7 Score 0 0 3 0  Anxiety Difficulty - Not difficult at all Not difficult at all Not difficult at all    BP Readings from Last 3 Encounters:  04/07/21 100/80   02/22/21 116/68  10/20/20 124/72    Physical Exam Vitals and nursing note reviewed.  Constitutional:      Appearance: She is well-developed.  HENT:     Head: Normocephalic.     Right Ear: External ear normal.     Left Ear: External ear normal.  Eyes:     General: Lids are everted, no foreign bodies appreciated. No scleral icterus.       Left eye: No foreign body or hordeolum.     Conjunctiva/sclera: Conjunctivae normal.     Right eye: Right conjunctiva is not injected.     Left eye: Left conjunctiva is not injected.     Pupils: Pupils are equal, round, and reactive to light.  Neck:     Thyroid: No thyromegaly.     Vascular: No JVD.     Trachea:  No tracheal deviation.  Cardiovascular:     Rate and Rhythm: Normal rate and regular rhythm.     Pulses: Normal pulses.     Heart sounds: Normal heart sounds, S1 normal and S2 normal. No murmur heard. No systolic murmur is present.  No diastolic murmur is present.    No friction rub. No gallop. No S3 or S4 sounds.  Pulmonary:     Effort: Pulmonary effort is normal. No respiratory distress.     Breath sounds: Normal breath sounds. No decreased breath sounds, wheezing, rhonchi or rales.  Abdominal:     General: Bowel sounds are normal.     Palpations: Abdomen is soft. There is no mass.     Tenderness: There is no abdominal tenderness. There is no guarding or rebound.  Musculoskeletal:        General: No tenderness. Normal range of motion.     Cervical back: Normal range of motion and neck supple.  Lymphadenopathy:     Cervical: No cervical adenopathy.  Skin:    General: Skin is warm.     Findings: No rash.  Neurological:     Mental Status: She is alert and oriented to person, place, and time.     Cranial Nerves: No cranial nerve deficit.     Deep Tendon Reflexes: Reflexes normal.  Psychiatric:        Mood and Affect: Mood is not anxious or depressed.    Wt Readings from Last 3 Encounters:  04/07/21 191 lb (86.6 kg)   02/22/21 193 lb (87.5 kg)  10/20/20 199 lb 6.4 oz (90.4 kg)    BP 100/80   Pulse 68   Ht '5\' 3"'$  (1.6 m)   Wt 191 lb (86.6 kg)   BMI 33.83 kg/m   Assessment and Plan:  1. Essential hypertension Chronic.  Controlled.  Stable.  Uncomplicated.  Blood pressure 100/80.  Continue losartan hydrochlorothiazide 100-25 once a day we will check renal function panel for electrolytes and GFR. - losartan-hydrochlorothiazide (HYZAAR) 100-25 MG tablet; Take 1 tablet by mouth daily.  Dispense: 90 tablet; Refill: 1 - Renal Function Panel  2. Mild intermittent reactive airway disease with acute exacerbation Chronic.  Controlled.  Stable.  Patient is using this on an episodic basis.  Currently is doing well there is some wheezes noted and I have told her to refill her albuterol if it is out of date - albuterol (VENTOLIN HFA) 108 (90 Base) MCG/ACT inhaler; Inhale 2 puffs into the lungs every 6 (six) hours as needed for wheezing or shortness of breath.  Dispense: 1 each; Refill: 11  3. Mixed hyperlipidemia Chronic.  Controlled.  Stable.  Patient is doing this with dietary approach only.  Will check lipid panel and review triglyceride and LDL readings with patient with ratio. - Lipid Panel With LDL/HDL Ratio  4. Need for immunization against influenza Discussed and administered. - Flu Vaccine QUAD 62moIM (Fluarix, Fluzone & Alfiuria Quad PF)

## 2021-04-07 NOTE — Patient Instructions (Signed)

## 2021-04-08 LAB — LIPID PANEL WITH LDL/HDL RATIO
Cholesterol, Total: 226 mg/dL — ABNORMAL HIGH (ref 100–199)
HDL: 57 mg/dL (ref >39)
LDL Chol Calc (NIH): 142 mg/dL — ABNORMAL HIGH (ref 0–99)
LDL/HDL Ratio: 2.5 ratio (ref 0.0–3.2)
Triglycerides: 152 mg/dL — ABNORMAL HIGH (ref 0–149)
VLDL Cholesterol Cal: 27 mg/dL (ref 5–40)

## 2021-04-08 LAB — RENAL FUNCTION PANEL
Albumin: 4.6 g/dL (ref 3.8–4.8)
BUN/Creatinine Ratio: 14 (ref 12–28)
BUN: 15 mg/dL (ref 8–27)
CO2: 23 mmol/L (ref 20–29)
Calcium: 9.9 mg/dL (ref 8.7–10.3)
Chloride: 100 mmol/L (ref 96–106)
Creatinine, Ser: 1.08 mg/dL — ABNORMAL HIGH (ref 0.57–1.00)
Glucose: 101 mg/dL — ABNORMAL HIGH (ref 65–99)
Phosphorus: 3.4 mg/dL (ref 3.0–4.3)
Potassium: 4 mmol/L (ref 3.5–5.2)
Sodium: 142 mmol/L (ref 134–144)
eGFR: 58 mL/min/{1.73_m2} — ABNORMAL LOW (ref >59)

## 2021-04-09 ENCOUNTER — Ambulatory Visit: Payer: Medicare Other | Admitting: Family Medicine

## 2021-04-20 ENCOUNTER — Ambulatory Visit (INDEPENDENT_AMBULATORY_CARE_PROVIDER_SITE_OTHER): Payer: Medicare Other | Admitting: Family Medicine

## 2021-04-20 ENCOUNTER — Encounter: Payer: Self-pay | Admitting: Family Medicine

## 2021-04-20 ENCOUNTER — Other Ambulatory Visit: Payer: Self-pay

## 2021-04-20 VITALS — BP 118/82 | HR 77 | Ht 63.0 in | Wt 194.0 lb

## 2021-04-20 DIAGNOSIS — M1811 Unilateral primary osteoarthritis of first carpometacarpal joint, right hand: Secondary | ICD-10-CM | POA: Diagnosis not present

## 2021-04-20 NOTE — Patient Instructions (Signed)
-   Utilize topical anti-inflammatory Pennsaid (diclofenac 2%) every morning and every evening to clean and dry skin about the thumb - Use thumb brace during wakeful hours while active, okay to remove for eating, driving, bathing, sleeping - For additional pain control utilize ice at 20-minute intervals, heat, and Tylenol (acetaminophen) - Avoid any additional NSAIDs (ibuprofen, naproxen) - If symptoms persist at the 2-week mark, contact our office for next steps - Otherwise, if improved, start home exercises and follow-up as scheduled

## 2021-04-20 NOTE — Progress Notes (Signed)
Primary Care / Sports Medicine Office Visit  Patient Information:  Patient ID: Barbara Durham, female DOB: 24-Jul-1961 Age: 60 y.o. MRN: [id number]   Barbara Durham is a pleasant 60 y.o. female presenting with the following:  Chief Complaint  Patient presents with   Primary osteoarthritis of first carpometacarpal joint    Right; patient states 04/17/21 while putting a fitted sheet on her bead she bent her wrist and heard a pop and it has been painful, stiff, sore, and burning that radiates through the palm and forearm; no numbness or tingling associated; patient experienced relief with injection and is still using diclofenac gel and Aleve for inflammation relief; right-handedness; 4/10 pain    Review of Systems pertinent details above   Patient Active Problem List   Diagnosis Date Noted   Ganglion cyst of volar aspect of right wrist 10/20/2020   Primary osteoarthritis of first carpometacarpal joint of right hand 10/20/2020   Primary insomnia 03/19/2020   Hypertension 01/21/2015   Back ache 06/14/2013   Lumbar transverse process fracture ([place]) 06/14/2013   Past Medical History:  Diagnosis Date   Anxiety    Depression    Endometriosis    GERD (gastroesophageal reflux disease)    Hemorrhoids    Hypertension    Nocturia    enuresis   Osteopenia 10/2009, 6/20   in spine and hip   PONV (postoperative nausea and vomiting)    Stress incontinence, female    Outpatient Encounter Medications as of 04/20/2021  Medication Sig   albuterol (VENTOLIN HFA) 108 (90 Base) MCG/ACT inhaler Inhale 2 puffs into the lungs every 6 (six) hours as needed for wheezing or shortness of breath.   Aspirin-Caffeine (BC FAST PAIN RELIEF ARTHRITIS PO) Take 1 packet by mouth in the morning and at bedtime.   Diclofenac Sodium 2 % SOLN Place 1 spray onto the skin 2 (two) times daily as needed.   losartan-hydrochlorothiazide (HYZAAR) 100-25 MG tablet Take 1 tablet by mouth daily.   naproxen sodium  (ALEVE) 220 MG tablet Take 880 mg by mouth daily as needed.   vortioxetine HBr (TRINTELLIX) 20 MG TABS tablet Take 1 tablet (20 mg total) by mouth daily. Pt is on PAP   No facility-administered encounter medications on file as of 04/20/2021.   Past Surgical History:  Procedure Laterality Date   arm fracture Left    COLONOSCOPY  2012   normal   CYSTOSCOPY  11/2011   DENTAL SURGERY     FOOT SURGERY Bilateral    LAPAROSCOPY  04/10/1997   LSO  1998   with LOA   OOPHORECTOMY     ORIF ORBITAL FRACTURE Left 01/16/2018   Procedure: OPEN REDUCTION INTERNAL FIXATION ORBITAL BLOW OUT FRACTURE;  Surgeon: Clyde Canterbury, MD;  Location: [facility];  Service: ENT;  Laterality: Left;  LEIBINGER SET    [place]   with [place]:   04/20/21 0805  BP: 118/82  Pulse: 77  SpO2: 97%   Vitals:   04/20/21 0805  Weight: 194 lb (88 kg)  Height: 5\' 3"  (1.6 m)   Body mass index is 34.37 kg/m.  No results found.   Independent interpretation of notes and tests performed by another provider:   None  Procedures performed:   None  Pertinent History, Exam, Impression, and Recommendations:   Primary osteoarthritis of first carpometacarpal joint of right hand Patient has  noted recurrent symptomatology at the right basal joint of the first digit after pulling on and placing a fitted sheet.  At that time she appreciated a "pop "sensation to the base of her thumb/wrist, denied any ecchymosis, some radiation of pain proximally to the mid forearm extensor surface.  She has been addressing symptoms with previous prescribed Pennsaid 2%, heat, Aleve, and a BC powder.  She denies any dysfunction, does have pain with regular tasks with her hand including grasping, denies any paresthesias, no finger involvement.  Examination today reveals no overt signs of swelling, ecchymosis, range of motion is full, painful during abduction/adduction,  opposition of the first digit.  She has focal tenderness at the first [facility] diffusely, radial styloid benign, equivocal grind test, negative Finkelstein, Phalen's and Tinel benign, sensorimotor otherwise intact.  Her clinical features and findings are most consistent with reactivation of her known first [place] osteoarthritis of the right hand.  Given the proximity of recent corticosteroid injection, we will defer this option, and is placed advised scheduled topical Pennsaid application x2 weeks see specific medication management guidelines reviewed for this prescription medication), supportive care with ice, heat, and a semirigid thumb spica brace was provided for use during active hours.  Given her recent renal function I have advised her to hold from oral NSAIDs.  If suboptimal progress note 2 weeks, plan for follow-up and reevaluation.  If improved, home-based rehab to commence an effort to maintain symptom control.  Exercises were provided to the patient in this regard today.   Orders & Medications No orders of the defined types were placed in this encounter.  No orders of the defined types were placed in this encounter.    Return for As scheduled 05/25/2021, contact in 2 weeks if symptomatic.     [name], MD   Primary Care Sports Medicine [place]

## 2021-04-20 NOTE — Assessment & Plan Note (Signed)
Patient has noted recurrent symptomatology at the right basal joint of the first digit after pulling on and placing a fitted sheet.  At that time she appreciated a "pop "sensation to the base of her thumb/wrist, denied any ecchymosis, some radiation of pain proximally to the mid forearm extensor surface.  She has been addressing symptoms with previous prescribed Pennsaid 2%, heat, Aleve, and a BC powder.  She denies any dysfunction, does have pain with regular tasks with her hand including grasping, denies any paresthesias, no finger involvement.  Examination today reveals no overt signs of swelling, ecchymosis, range of motion is full, painful during abduction/adduction, opposition of the first digit.  She has focal tenderness at the first [facility] diffusely, radial styloid benign, equivocal grind test, negative Finkelstein, Phalen's and Tinel benign, sensorimotor otherwise intact.  Her clinical features and findings are most consistent with reactivation of her known first [place] osteoarthritis of the right hand.  Given the proximity of recent corticosteroid injection, we will defer this option, and is placed advised scheduled topical Pennsaid application x2 weeks see specific medication management guidelines reviewed for this prescription medication), supportive care with ice, heat, and a semirigid thumb spica brace was provided for use during active hours.  Given her recent renal function I have advised her to hold from oral NSAIDs.  If suboptimal progress note 2 weeks, plan for follow-up and reevaluation.  If improved, home-based rehab to commence an effort to maintain symptom control.  Exercises were provided to the patient in this regard today.

## 2021-05-17 ENCOUNTER — Ambulatory Visit (INDEPENDENT_AMBULATORY_CARE_PROVIDER_SITE_OTHER): Payer: Medicare Other

## 2021-05-17 ENCOUNTER — Other Ambulatory Visit: Payer: Self-pay

## 2021-05-17 VITALS — BP 112/72 | HR 85 | Temp 97.7°F | Resp 16 | Ht 63.0 in | Wt 192.2 lb

## 2021-05-17 DIAGNOSIS — Z0000 Encounter for general adult medical examination without abnormal findings: Secondary | ICD-10-CM | POA: Diagnosis not present

## 2021-05-17 DIAGNOSIS — Z1231 Encounter for screening mammogram for malignant neoplasm of breast: Secondary | ICD-10-CM

## 2021-05-17 NOTE — Patient Instructions (Signed)
Barbara Durham , Thank you for taking time to come for your Medicare Wellness Visit. I appreciate your ongoing commitment to your health goals. Please review the following plan we discussed and let me know if I can assist you in the future.   Screening recommendations/referrals: Colonoscopy: done 2012; due for repeat screening.  Mammogram: done 06/02/20. Please call [phone] to schedule your mammogram.  Bone Density: done 01/11/19 Recommended yearly ophthalmology/optometry visit for glaucoma screening and checkup Recommended yearly dental visit for hygiene and checkup  Vaccinations: Influenza vaccine: done 04/07/21 Pneumococcal vaccine: declined Tdap vaccine: due Shingles vaccine: Shingrix discussed. Please contact your pharmacy for coverage information.  Covid-19: done 04/06/20 & 04/27/20  Advanced directives: Advance directive discussed with you today. I have provided a copy for you to complete at home and have notarized. Once this is complete please bring a copy in to our office so we can scan it into your chart.   Conditions/risks identified: Recommend increasing physical activity to at least 3 days per week   Next appointment: Follow up in one year for your annual wellness visit.   Preventive Care 40-64 Years, Female Preventive care refers to lifestyle choices and visits with your health care provider that can promote health and wellness. What does preventive care include? A yearly physical exam. This is also called an annual well check. Dental exams once or twice a year. Routine eye exams. Ask your health care provider how often you should have your eyes checked. Personal lifestyle choices, including: Daily care of your teeth and gums. Regular physical activity. Eating a healthy diet. Avoiding tobacco and drug use. Limiting alcohol use. Practicing safe sex. Taking low-dose aspirin daily starting at age 78. Taking vitamin and mineral supplements as recommended by your health  care provider. What happens during an annual well check? The services and screenings done by your health care provider during your annual well check will depend on your age, overall health, lifestyle risk factors, and family history of disease. Counseling  Your health care provider may ask you questions about your: Alcohol use. Tobacco use. Drug use. Emotional well-being. Home and relationship well-being. Sexual activity. Eating habits. Work and work Statistician. Method of birth control. Menstrual cycle. Pregnancy history. Screening  You may have the following tests or measurements: Height, weight, and BMI. Blood pressure. Lipid and cholesterol levels. These may be checked every 5 years, or more frequently if you are over 31 years old. Skin check. Lung cancer screening. You may have this screening every year starting at age 46 if you have a 30-pack-year history of smoking and currently smoke or have quit within the past 15 years. Fecal occult blood test (FOBT) of the stool. You may have this test every year starting at age 41. Flexible sigmoidoscopy or colonoscopy. You may have a sigmoidoscopy every 5 years or a colonoscopy every 10 years starting at age 38. Hepatitis C blood test. Hepatitis B blood test. Sexually transmitted disease (STD) testing. Diabetes screening. This is done by checking your blood sugar (glucose) after you have not eaten for a while (fasting). You may have this done every 1-3 years. Mammogram. This may be done every 1-2 years. Talk to your health care provider about when you should start having regular mammograms. This may depend on whether you have a family history of breast cancer. BRCA-related cancer screening. This may be done if you have a family history of breast, ovarian, tubal, or peritoneal cancers. Pelvic exam and Pap test. This may be done every  3 years starting at age 51. Starting at age 66, this may be done every 5 years if you have a Pap test in  combination with an HPV test. Bone density scan. This is done to screen for osteoporosis. You may have this scan if you are at high risk for osteoporosis. Discuss your test results, treatment options, and if necessary, the need for more tests with your health care provider. Vaccines  Your health care provider may recommend certain vaccines, such as: Influenza vaccine. This is recommended every year. Tetanus, diphtheria, and acellular pertussis (Tdap, Td) vaccine. You may need a Td booster every 10 years. Zoster vaccine. You may need this after age 37. Pneumococcal 13-valent conjugate (PCV13) vaccine. You may need this if you have certain conditions and were not previously vaccinated. Pneumococcal polysaccharide (PPSV23) vaccine. You may need one or two doses if you smoke cigarettes or if you have certain conditions. Talk to your health care provider about which screenings and vaccines you need and how often you need them. This information is not intended to replace advice given to you by your health care provider. Make sure you discuss any questions you have with your health care provider. Document Released: 08/07/2015 Document Revised: 03/30/2016 Document Reviewed: 05/12/2015 Elsevier Interactive Patient Education  2017 [place] Prevention in the Home Falls can cause injuries. They can happen to people of all ages. There are many things you can do to make your home safe and to help prevent falls. What can I do on the outside of my home? Regularly fix the edges of walkways and driveways and fix any cracks. Remove anything that might make you trip as you walk through a door, such as a raised step or threshold. Trim any bushes or trees on the path to your home. Use bright outdoor lighting. Clear any walking paths of anything that might make someone trip, such as rocks or tools. Regularly check to see if handrails are loose or broken. Make sure that both sides of any steps have  handrails. Any raised decks and porches should have guardrails on the edges. Have any leaves, snow, or ice cleared regularly. Use sand or salt on walking paths during winter. Clean up any spills in your garage right away. This includes oil or grease spills. What can I do in the bathroom? Use night lights. Install grab bars by the toilet and in the tub and shower. Do not use towel bars as grab bars. Use non-skid mats or decals in the tub or shower. If you need to sit down in the shower, use a plastic, non-slip stool. Keep the floor dry. Clean up any water that spills on the floor as soon as it happens. Remove soap buildup in the tub or shower regularly. Attach bath mats securely with double-sided non-slip rug tape. Do not have throw rugs and other things on the floor that can make you trip. What can I do in the bedroom? Use night lights. Make sure that you have a light by your bed that is easy to reach. Do not use any sheets or blankets that are too big for your bed. They should not hang down onto the floor. Have a firm chair that has side arms. You can use this for support while you get dressed. Do not have throw rugs and other things on the floor that can make you trip. What can I do in the kitchen? Clean up any spills right away. Avoid walking on wet  floors. Keep items that you use a lot in easy-to-reach places. If you need to reach something above you, use a strong step stool that has a grab bar. Keep electrical cords out of the way. Do not use floor polish or wax that makes floors slippery. If you must use wax, use non-skid floor wax. Do not have throw rugs and other things on the floor that can make you trip. What can I do with my stairs? Do not leave any items on the stairs. Make sure that there are handrails on both sides of the stairs and use them. Fix handrails that are broken or loose. Make sure that handrails are as long as the stairways. Check any carpeting to make sure  that it is firmly attached to the stairs. Fix any carpet that is loose or worn. Avoid having throw rugs at the top or bottom of the stairs. If you do have throw rugs, attach them to the floor with carpet tape. Make sure that you have a light switch at the top of the stairs and the bottom of the stairs. If you do not have them, ask someone to add them for you. What else can I do to help prevent falls? Wear shoes that: Do not have high heels. Have rubber bottoms. Are comfortable and fit you well. Are closed at the toe. Do not wear sandals. If you use a stepladder: Make sure that it is fully opened. Do not climb a closed stepladder. Make sure that both sides of the stepladder are locked into place. Ask someone to hold it for you, if possible. Clearly mark and make sure that you can see: Any grab bars or handrails. First and last steps. Where the edge of each step is. Use tools that help you move around (mobility aids) if they are needed. These include: Canes. Walkers. Scooters. Crutches. Turn on the lights when you go into a dark area. Replace any light bulbs as soon as they burn out. Set up your furniture so you have a clear path. Avoid moving your furniture around. If any of your floors are uneven, fix them. If there are any pets around you, be aware of where they are. Review your medicines with your doctor. Some medicines can make you feel dizzy. This can increase your chance of falling. Ask your doctor what other things that you can do to help prevent falls. This information is not intended to replace advice given to you by your health care provider. Make sure you discuss any questions you have with your health care provider. Document Released: 05/07/2009 Document Revised: 12/17/2015 Document Reviewed: 08/15/2014 Elsevier Interactive Patient Education  2017 [employer].

## 2021-05-17 NOTE — Progress Notes (Signed)
Subjective:   Barbara Durham is a 60 y.o. female who presents for Medicare Annual (Subsequent) preventive examination.  Review of Systems     Cardiac Risk Factors include: advanced age (>46men, >24 women);hypertension;obesity (BMI >30kg/m2)     Objective:    Today's Vitals   05/17/21 1442  BP: 112/72  Pulse: 85  Resp: 16  Temp: 97.7 F (36.5 C)  TempSrc: Oral  SpO2: 96%  Weight: 192 lb 3.2 oz (87.2 kg)  Height: 5\' 3"  (1.6 m)   Body mass index is 34.05 kg/m.  Advanced Directives 05/17/2021 05/13/2020 01/16/2018  Does Patient Have a Medical Advance Directive? No No No  Would patient like information on creating a medical advance directive? Yes (MAU/Ambulatory/Procedural Areas - Information given) Yes (MAU/Ambulatory/Procedural Areas - Information given) No - Patient declined  Some encounter information is confidential and restricted. Go to Review Flowsheets activity to see all data.    Current Medications (verified) Outpatient Encounter Medications as of 05/17/2021  Medication Sig   albuterol (VENTOLIN HFA) 108 (90 Base) MCG/ACT inhaler Inhale 2 puffs into the lungs every 6 (six) hours as needed for wheezing or shortness of breath.   Aspirin-Caffeine (BC FAST PAIN RELIEF ARTHRITIS PO) Take 1 packet by mouth in the morning and at bedtime.   Diclofenac Sodium 2 % SOLN Place 1 spray onto the skin 2 (two) times daily as needed.   losartan-hydrochlorothiazide (HYZAAR) 100-25 MG tablet Take 1 tablet by mouth daily.   vortioxetine HBr (TRINTELLIX) 20 MG TABS tablet Take 1 tablet (20 mg total) by mouth daily. Pt is on PAP   [DISCONTINUED] naproxen sodium (ALEVE) 220 MG tablet Take 880 mg by mouth daily as needed.   No facility-administered encounter medications on file as of 05/17/2021.    Allergies (verified) Patient has no known allergies.   History: Past Medical History:  Diagnosis Date   Anxiety    Depression    Endometriosis    GERD (gastroesophageal reflux  disease)    Hemorrhoids    Hypertension    Nocturia    enuresis   Osteopenia 10/2009, 6/20   in spine and hip   PONV (postoperative nausea and vomiting)    Stress incontinence, female    Past Surgical History:  Procedure Laterality Date   arm fracture Left    COLONOSCOPY  2012   normal   CYSTOSCOPY  11/2011   DENTAL SURGERY     FOOT SURGERY Bilateral    LAPAROSCOPY  04/10/1997   LSO  1998   with LOA   OOPHORECTOMY     ORIF ORBITAL FRACTURE Left 01/16/2018   Procedure: OPEN REDUCTION INTERNAL FIXATION ORBITAL BLOW OUT FRACTURE;  Surgeon: Clyde Canterbury, MD;  Location: [place];  Service: ENT;  Laterality: Left;  LEIBINGER SET    [place]   with [place]   Family History  Problem Relation Age of Onset   Heart disease Mother    Hypertension Mother    Dementia Mother    Depression Mother    Diabetes Mother        type 2   Alzheimer's disease Father    Stroke Father    CVA Father    Prostate cancer Father    Heart disease Sister    Osteoporosis Sister    Arthritis Brother    Depression Brother    Depression Brother    Gout Brother    Hypertension Brother  Breast cancer Paternal Aunt        94s   Social History   Socioeconomic History   Marital status: Divorced    Spouse name: Not on file   Number of children: 1   Years of education: Not on file   Highest education level: Not on file  Occupational History   Not on file  Tobacco Use   Smoking status: Never   Smokeless tobacco: Never  Vaping Use   Vaping Use: Never used  Substance and Sexual Activity   Alcohol use: Not Currently    Alcohol/week: 0.0 - 1.0 standard drinks   Drug use: No   Sexual activity: Not Currently    Partners: Male    Birth control/protection: None  Other Topics Concern   Not on file  Social History Narrative   Pt lives alone   Social Determinants of Health   Financial Resource Strain: Low Risk    Difficulty  of Paying Living Expenses: Not hard at all  Food Insecurity: No Food Insecurity   Worried About Charity fundraiser in the Last Year: Never true   Arboriculturist in the Last Year: Never true  Transportation Needs: Not on file  Physical Activity: Inactive   Days of Exercise per Week: 0 days   Minutes of Exercise per Session: 0 min  Stress: No Stress Concern Present   Feeling of Stress : Not at all  Social Connections: Socially Isolated   Frequency of Communication with Friends and Family: More than three times a week   Frequency of Social Gatherings with Friends and Family: More than three times a week   Attends Religious Services: Never   Marine scientist or Organizations: No   Attends Music therapist: Never   Marital Status: Divorced    Tobacco Counseling Counseling given: Not Answered   Clinical Intake:  Pre-visit preparation completed: Yes  Pain : No/denies pain     BMI - recorded: 34.05 Nutritional Status: BMI > 30  Obese Nutritional Risks: None Diabetes: No  How often do you need to have someone help you when you read instructions, pamphlets, or other written materials from your doctor or pharmacy?: 1 - Never    Interpreter Needed?: No  Information entered by :: Clemetine Marker LPN   Activities of Daily Living In your present state of health, do you have any difficulty performing the following activities: 05/17/2021 04/20/2021  Hearing? N N  Vision? N N  Difficulty concentrating or making decisions? N N  Walking or climbing stairs? N N  Dressing or bathing? N N  Doing errands, shopping? N N  Preparing Food and eating ? N -  Using the Toilet? N -  In the past six months, have you accidently leaked urine? N -  Do you have problems with loss of bowel control? N -  Managing your Medications? N -  Managing your Finances? N -  Housekeeping or managing your Housekeeping? N -  Some recent data might be hidden    Patient Care Team: Juline Patch, MD as PCP - General (Family Medicine) Norman Clay, MD as Consulting Physician (Psychiatry) [name], MD as Consulting Physician (Sports Medicine)  Indicate any recent Medical Services you may have received from other than [facility] providers in the past year (date may be approximate).     Assessment:   This is a routine wellness examination for [name].  Hearing/Vision screen Hearing Screening - Comments::  Pt denies  hearing difficulty Vision Screening - Comments:: Annual vision screenings done by Dr. Matilde Sprang  Dietary issues and exercise activities discussed: Current Exercise Habits: The patient does not participate in regular exercise at present, Exercise limited by: None identified   Goals Addressed   None    Depression Screen PHQ 2/9 Scores 05/17/2021 04/20/2021 04/07/2021 02/22/2021 10/20/2020 10/05/2020 05/13/2020  PHQ - 2 Score 0 0 0 0 0 0 0  PHQ- 9 Score 0 0 0 0 0 0 -  Some encounter information is confidential and restricted. Go to Review Flowsheets activity to see all data.    Fall Risk Fall Risk  05/17/2021 04/20/2021 04/07/2021 02/22/2021 10/20/2020  Falls in the past year? 0 0 0 0 0  Number falls in past yr: 0 0 0 0 0  Injury with Fall? 0 0 0 0 0  Risk for fall due to : No Fall Risks No Fall Risks No Fall Risks No Fall Risks No Fall Risks  Follow up Falls prevention discussed Falls evaluation completed Falls evaluation completed Falls evaluation completed Falls evaluation completed    [place]:  Any stairs in or around the home? Yes  If so, are there any without handrails? Yes  - 3 front steps Home free of loose throw rugs in walkways, pet beds, electrical cords, etc? Yes  Adequate lighting in your home to reduce risk of falls? Yes   ASSISTIVE DEVICES UTILIZED TO PREVENT FALLS:  Life alert? No  Use of a cane, walker or w/c? No  Grab bars in the bathroom? Yes  Shower chair or bench in shower? No  Elevated toilet seat or  a handicapped toilet? No   TIMED UP AND GO:  Was the test performed? Yes .  Length of time to ambulate 10 feet: 5 sec.   Gait steady and fast without use of assistive device  Cognitive Function: Normal cognitive status assessed by direct observation by this Nurse Health Advisor. No abnormalities found.          Immunizations Immunization History  Administered Date(s) Administered   Influenza,inj,Quad PF,6+ Mos 07/06/2016, 05/15/2017, 09/05/2018, 09/23/2019, 06/04/2020, 04/07/2021   PFIZER(Purple Top)SARS-COV-2 Vaccination 04/06/2020, 04/27/2020   Tdap 05/19/2013, 07/06/2016    TDAP status: Up to date  Flu Vaccine status: Up to date  Pneumococcal vaccine status: Declined,  Education has been provided regarding the importance of this vaccine but patient still declined. Advised may receive this vaccine at local pharmacy or Health Dept. Aware to provide a copy of the vaccination record if obtained from local pharmacy or Health Dept. Verbalized acceptance and understanding.   Covid-19 vaccine status: Completed vaccines  Qualifies for Shingles Vaccine? Yes   Zostavax completed No   Shingrix Completed?: No.    Education has been provided regarding the importance of this vaccine. Patient has been advised to call insurance company to determine out of pocket expense if they have not yet received this vaccine. Advised may also receive vaccine at local pharmacy or Health Dept. Verbalized acceptance and understanding.  Screening Tests Health Maintenance  Topic Date Due   Pneumococcal Vaccine 66-26 Years old (1 - PCV) Never done   COVID-19 Vaccine (3 - Booster for Pfizer series) 06/22/2020   Zoster Vaccines- Shingrix (1 of 2) 07/07/2021 (Originally 04/06/2010)   COLONOSCOPY (Pts 45-29yrs Insurance coverage will need to be confirmed)  04/07/2022 (Originally 07/25/2020)   PAP SMEAR-Modifier  08/20/2021   MAMMOGRAM  06/02/2022   TETANUS/TDAP  07/06/2026   INFLUENZA VACCINE  Completed   HPV  VACCINES  Aged Out   Hepatitis C Screening  Discontinued   HIV Screening  Discontinued    Health Maintenance  Health Maintenance Due  Topic Date Due   Pneumococcal Vaccine 73-36 Years old (1 - PCV) Never done   COVID-19 Vaccine (3 - Booster for Pfizer series) 06/22/2020    Colorectal cancer screening: Type of screening: Colonoscopy. Completed 2012. Repeat every 10 years. Pt declines repeat screening at this time.   Mammogram status: Completed 06/02/20. Repeat every year  Bone Density status: Completed 01/11/19. Results reflect: Bone density results: OSTEOPENIA. Repeat every 2 or as directed years.  Lung Cancer Screening: (Low Dose CT Chest recommended if Age 67-80 years, 30 pack-year currently smoking OR have quit w/in 15years.) does not qualify.   Additional Screening:  Hepatitis C Screening: does qualify; postponed  Vision Screening: Recommended annual ophthalmology exams for early detection of glaucoma and other disorders of the eye. Is the patient up to date with their annual eye exam?  Yes  Who is the provider or what is the name of the office in which the patient attends annual eye exams? Dr. Matilde Sprang.   Dental Screening: Recommended annual dental exams for proper oral hygiene  Community Resource Referral / Chronic Care Management: CRR required this visit?  No   CCM required this visit?  No      Plan:     I have personally reviewed and noted the following in the patient's chart:   Medical and social history Use of alcohol, tobacco or illicit drugs  Current medications and supplements including opioid prescriptions.  Functional ability and status Nutritional status Physical activity Advanced directives List of other physicians Hospitalizations, surgeries, and ER visits in previous 12 months Vitals Screenings to include cognitive, depression, and falls Referrals and appointments  In addition, I have reviewed and discussed with patient certain preventive  protocols, quality metrics, and best practice recommendations. A written personalized care plan for preventive services as well as general preventive health recommendations were provided to patient.     Clemetine Marker, LPN   [id number]   Nurse Notes: none

## 2021-05-25 ENCOUNTER — Ambulatory Visit: Payer: Medicare Other | Admitting: Family Medicine

## 2021-05-25 ENCOUNTER — Ambulatory Visit (INDEPENDENT_AMBULATORY_CARE_PROVIDER_SITE_OTHER): Payer: Medicare Other | Admitting: Family Medicine

## 2021-05-25 ENCOUNTER — Encounter: Payer: Self-pay | Admitting: Family Medicine

## 2021-05-25 ENCOUNTER — Other Ambulatory Visit: Payer: Self-pay

## 2021-05-25 ENCOUNTER — Inpatient Hospital Stay: Payer: Self-pay | Admitting: Radiology

## 2021-05-25 VITALS — BP 116/86 | HR 81 | Ht 63.0 in | Wt 193.0 lb

## 2021-05-25 DIAGNOSIS — M1811 Unilateral primary osteoarthritis of first carpometacarpal joint, right hand: Secondary | ICD-10-CM

## 2021-05-25 MED ORDER — TRIAMCINOLONE ACETONIDE 40 MG/ML IJ SUSP
20.0000 mg | Freq: Once | INTRAMUSCULAR | Status: AC
  Administered 2021-05-25: 20 mg

## 2021-05-25 NOTE — Assessment & Plan Note (Addendum)
Patient is noted interval improvement though without resolution following brace and NSAID usage.  Her examination shows focal tenderness at the right first [facility].  Given her excellent response in the past, he did elect to proceed with ultrasound-guided first [facility] joint injection.  I have advised her on postinjection care, use of brace and medication management with Pennsaid (diclofenac 2%) on an as needed basis and we will coordinate a follow-up in 3 months.  I have provided home exercises for her to perform next week and continue regularly until her return in an effort to optimize her symptom control.

## 2021-05-25 NOTE — Patient Instructions (Addendum)
You have just been given a cortisone injection to reduce pain and inflammation. After the injection you may notice immediate relief of pain as a result of the Lidocaine. It is important to rest the area of the injection for 24 to 48 hours after the injection. There is a possibility of some temporary increased discomfort and swelling for up to 72 hours until the cortisone begins to work. If you do have pain, simply rest the joint and use ice. If you can tolerate over the counter medications, you can try Tylenol, Aleve, or Advil for added relief per package instructions. - Continue brace when needed - Use Pennsaid on unbroken skin up to twice daily on an as-needed basis - Perform home exercises - Return in 3 months

## 2021-05-25 NOTE — Progress Notes (Signed)
Primary Care / Sports Medicine Office Visit  Patient Information:  Patient ID: Barbara Durham, female DOB: 10/05/1960 Age: 60 y.o. MRN: [id number]   Barbara Durham is a pleasant 60 y.o. female presenting with the following:  Chief Complaint  Patient presents with   Primary osteoarthritis of first carpometacarpal joint of ri    Wearing wrist brace and using Pennsaid 2% as needed with relief; last cortisone injection 02/22/21; 4/10 pain    Review of Systems pertinent details above   Patient Active Problem List   Diagnosis Date Noted   Ganglion cyst of volar aspect of right wrist 10/20/2020   Primary osteoarthritis of first carpometacarpal joint of right hand 10/20/2020   Primary insomnia 03/19/2020   Hypertension 01/21/2015   Back ache 06/14/2013   Lumbar transverse process fracture ([place]) 06/14/2013   Past Medical History:  Diagnosis Date   Anxiety    Depression    Endometriosis    GERD (gastroesophageal reflux disease)    Hemorrhoids    Hypertension    Nocturia    enuresis   Osteopenia 10/2009, 6/20   in spine and hip   PONV (postoperative nausea and vomiting)    Stress incontinence, female    Outpatient Encounter Medications as of 05/25/2021  Medication Sig   albuterol (VENTOLIN HFA) 108 (90 Base) MCG/ACT inhaler Inhale 2 puffs into the lungs every 6 (six) hours as needed for wheezing or shortness of breath.   Aspirin-Caffeine (BC FAST PAIN RELIEF ARTHRITIS PO) Take 1 packet by mouth in the morning and at bedtime.   Diclofenac Sodium 2 % SOLN Place 1 spray onto the skin 2 (two) times daily as needed.   losartan-hydrochlorothiazide (HYZAAR) 100-25 MG tablet Take 1 tablet by mouth daily.   vortioxetine HBr (TRINTELLIX) 20 MG TABS tablet Take 1 tablet (20 mg total) by mouth daily. Pt is on PAP   Facility-Administered Encounter Medications as of 05/25/2021  Medication   triamcinolone acetonide (KENALOG-40) injection 20 mg   Past Surgical History:  Procedure  Laterality Date   arm fracture Left    COLONOSCOPY  2012   normal   CYSTOSCOPY  11/2011   DENTAL SURGERY     FOOT SURGERY Bilateral    LAPAROSCOPY  04/10/1997   LSO  1998   with LOA   OOPHORECTOMY     ORIF ORBITAL FRACTURE Left 01/16/2018   Procedure: OPEN REDUCTION INTERNAL FIXATION ORBITAL BLOW OUT FRACTURE;  Surgeon: Clyde Canterbury, MD;  Location: [facility];  Service: ENT;  Laterality: Left;  LEIBINGER SET    [place]   with [place]:   05/25/21 1133  BP: 116/86  Pulse: 81  SpO2: 97%   Vitals:   05/25/21 1133  Weight: 193 lb (87.5 kg)  Height: 5\' 3"  (1.6 m)   Body mass index is 34.19 kg/m.  No results found.   Independent interpretation of notes and tests performed by another provider:   None  Procedures performed:   Procedure:  Injection of right first [place] joint under ultrasound guidance. Ultrasound guidance utilized for out of plane approach to right first [place], dynamic joint motion visualized and confirmation of injectate noted during procedure Samsung HS60 device utilized with permanent recording / reporting. Consent obtained and verified. Skin prepped in a sterile fashion. Ethyl chloride spray for topical local analgesia.  Completed without difficulty and tolerated well. Medication: triamcinolone acetonide 40  mg/mL suspension for injection 0.5 mL total and 0.5 mL lidocaine 1% without epinephrine utilized for needle placement anesthetic Advised to contact for fevers/chills, erythema, induration, drainage, or persistent bleeding.   Pertinent History, Exam, Impression, and Recommendations:   Primary osteoarthritis of first carpometacarpal joint of right hand Patient is noted interval improvement though without resolution following brace and NSAID usage.  Her examination shows focal tenderness at the right first [facility].  Given her excellent response in the past, he did elect to proceed  with ultrasound-guided first [facility] joint injection.  I have advised her on postinjection care, use of brace and medication management with Pennsaid (diclofenac 2%) on an as needed basis and we will coordinate a follow-up in 3 months.  I have provided home exercises for her to perform next week and continue regularly until her return in an effort to optimize her symptom control.   Orders & Medications Meds ordered this encounter  Medications   triamcinolone acetonide (KENALOG-40) injection 20 mg   Orders Placed This Encounter  Procedures   [place] LIMITED JOINT SPACE STRUCTURES UP RIGHT     Return in about 3 months (around 08/25/2021).     [name], MD   Primary Care Sports Medicine [place]

## 2021-06-01 ENCOUNTER — Telehealth: Payer: Self-pay

## 2021-06-01 NOTE — Telephone Encounter (Signed)
Called pt and gave number to [facility]

## 2021-06-03 DIAGNOSIS — Z20822 Contact with and (suspected) exposure to covid-19: Secondary | ICD-10-CM | POA: Diagnosis not present

## 2021-06-09 ENCOUNTER — Other Ambulatory Visit: Payer: Self-pay

## 2021-06-09 ENCOUNTER — Ambulatory Visit
Admission: RE | Admit: 2021-06-09 | Discharge: 2021-06-09 | Disposition: A | Discharge status: Home / Self Care | Payer: Medicare Other | Source: Ambulatory Visit | Attending: Family Medicine | Admitting: Family Medicine

## 2021-06-09 DIAGNOSIS — Z1231 Encounter for screening mammogram for malignant neoplasm of breast: Secondary | ICD-10-CM | POA: Diagnosis not present
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
## 2021-06-21 ENCOUNTER — Ambulatory Visit (INDEPENDENT_AMBULATORY_CARE_PROVIDER_SITE_OTHER): Payer: Medicare Other | Admitting: Psychiatry

## 2021-06-21 ENCOUNTER — Other Ambulatory Visit: Payer: Self-pay

## 2021-06-21 ENCOUNTER — Encounter: Payer: Self-pay | Admitting: Psychiatry

## 2021-06-21 VITALS — BP 156/90 | HR 71 | Temp 97.8°F | Wt 194.8 lb

## 2021-06-21 DIAGNOSIS — F419 Anxiety disorder, unspecified: Secondary | ICD-10-CM | POA: Diagnosis not present

## 2021-06-21 DIAGNOSIS — F4310 Post-traumatic stress disorder, unspecified: Secondary | ICD-10-CM

## 2021-06-21 DIAGNOSIS — F3342 Major depressive disorder, recurrent, in full remission: Secondary | ICD-10-CM

## 2021-06-21 NOTE — Patient Instructions (Signed)
Continue Trintellix 20 mg every day  Next appointment- 2/28 at 1 PM

## 2021-07-12 ENCOUNTER — Other Ambulatory Visit: Payer: Self-pay | Admitting: Family Medicine

## 2021-07-12 DIAGNOSIS — I10 Essential (primary) hypertension: Secondary | ICD-10-CM

## 2021-07-13 NOTE — Telephone Encounter (Signed)
Requested Prescriptions  Pending Prescriptions Disp Refills   losartan-hydrochlorothiazide (HYZAAR) 100-25 MG tablet [Pharmacy Med Name: LOSARTAN/HCTZ 100/25MG  TABLETS] 90 tablet 1    Sig: TAKE 1 TABLET BY MOUTH DAILY     Cardiovascular: ARB + Diuretic Combos Failed - 07/12/2021  1:40 PM      Failed - Cr in normal range and within 180 days    Creatinine  Date Value Ref Range Status  10/29/2014 1.45 (H) mg/dL Final    Comment:    0.44-1.00 NOTE: New Reference Range  09/30/14    Creatinine, Ser  Date Value Ref Range Status  04/07/2021 1.08 (H) 0.57 - 1.00 mg/dL Final         Failed - Last BP in normal range    BP Readings from Last 1 Encounters:  05/25/21 116/86         Passed - K in normal range and within 180 days    Potassium  Date Value Ref Range Status  04/07/2021 4.0 3.5 - 5.2 mmol/L Final  10/29/2014 2.7 (L) mmol/L Final    Comment:    3.5-5.1 NOTE: New Reference Range  09/30/14          Passed - Na in normal range and within 180 days    Sodium  Date Value Ref Range Status  04/07/2021 142 134 - 144 mmol/L Final  10/29/2014 135 mmol/L Final    Comment:    135-145 NOTE: New Reference Range  09/30/14          Passed - Ca in normal range and within 180 days    Calcium  Date Value Ref Range Status  04/07/2021 9.9 8.7 - 10.3 mg/dL Final   Calcium, Total  Date Value Ref Range Status  10/29/2014 9.1 mg/dL Final    Comment:    8.9-10.3 NOTE: New Reference Range  09/30/14          Passed - Patient is not pregnant      Passed - Valid encounter within last 6 months    Recent Outpatient Visits          1 month ago Primary osteoarthritis of first carpometacarpal joint of right hand   [facility] [name], MD   2 months ago Primary osteoarthritis of first carpometacarpal joint of right hand   [facility] [name], MD   3 months ago Essential hypertension   [place], Deanna C, MD   4 months ago  Primary osteoarthritis of first carpometacarpal joint of right hand   [facility] [name], MD   8 months ago Primary osteoarthritis of first carpometacarpal joint of right hand   [facility] [name], MD      Future Appointments            In 1 month Zigmund Daniel Earley Abide, MD [facility], [place]   In 2 months Juline Patch, MD [facility], [facility]

## 2021-08-02 ENCOUNTER — Telehealth: Payer: Self-pay

## 2021-08-02 ENCOUNTER — Other Ambulatory Visit: Payer: Self-pay | Admitting: Family Medicine

## 2021-08-02 DIAGNOSIS — I10 Essential (primary) hypertension: Secondary | ICD-10-CM

## 2021-08-02 NOTE — Telephone Encounter (Signed)
Medication management - Telephone call with pt to inform Dr. Modesta Messing reported the Trintellix forms she was looking for were filled out by her earlier today and faxed out.  Requested pt call back if any further issues.

## 2021-08-02 NOTE — Telephone Encounter (Signed)
I received the form this morning, and signed it to be faxed this morning, fyi.

## 2021-08-02 NOTE — Telephone Encounter (Signed)
Medication Refill - Medication: losartan-hydrochlorothiazide (HYZAAR) 100-25 MG tablet  Has the patient contacted their pharmacy? YES (Agent: If no, request that the patient contact the pharmacy for the refill. If patient does not wish to contact the pharmacy document the reason why and proceed with request.) (Agent: If yes, when and what did the pharmacy advise?) [facility] advised pt she had no refills when there should have been a 90 day supply refill/ pt has switched pharmacies due to having this issue with [facility] before / pt will now [facility] in [place] (with phone number or street name): [facility] [place]  Has the patient been seen for an appointment in the last year OR does the patient have an upcoming appointment? Yes.    Agent: Please be advised that RX refills may take up to 3 business days. We ask that you follow-up with your pharmacy.

## 2021-08-02 NOTE — Telephone Encounter (Signed)
Medication management - Telephone call with pt, after she left a message she was in need of some forms she had dropped off prior to Christmas to be filled out for her continued taking of Trintellix medication.  Pt reported leaving forms with admin staff as she stated these would need to be returned by the next week as she only has 1 week remaining of medication.  Patient stated the form she left that she had filled out part of it and left for the provider to complete.  Agreed to send request to locate these forms and to assist with getting them turned in prior to her running out of medication.

## 2021-08-02 NOTE — Telephone Encounter (Signed)
Requested Prescriptions  Pending Prescriptions Disp Refills   losartan-hydrochlorothiazide (HYZAAR) 100-25 MG tablet 90 tablet 1    Sig: Take 1 tablet by mouth daily.     Cardiovascular: ARB + Diuretic Combos Failed - 08/02/2021  3:22 PM      Failed - Cr in normal range and within 180 days    Creatinine  Date Value Ref Range Status  10/29/2014 1.45 (H) mg/dL Final    Comment:    0.44-1.00 NOTE: New Reference Range  09/30/14    Creatinine, Ser  Date Value Ref Range Status  04/07/2021 1.08 (H) 0.57 - 1.00 mg/dL Final         Failed - Last BP in normal range    BP Readings from Last 1 Encounters:  05/25/21 116/86         Passed - K in normal range and within 180 days    Potassium  Date Value Ref Range Status  04/07/2021 4.0 3.5 - 5.2 mmol/L Final  10/29/2014 2.7 (L) mmol/L Final    Comment:    3.5-5.1 NOTE: New Reference Range  09/30/14          Passed - Na in normal range and within 180 days    Sodium  Date Value Ref Range Status  04/07/2021 142 134 - 144 mmol/L Final  10/29/2014 135 mmol/L Final    Comment:    135-145 NOTE: New Reference Range  09/30/14          Passed - Ca in normal range and within 180 days    Calcium  Date Value Ref Range Status  04/07/2021 9.9 8.7 - 10.3 mg/dL Final   Calcium, Total  Date Value Ref Range Status  10/29/2014 9.1 mg/dL Final    Comment:    8.9-10.3 NOTE: New Reference Range  09/30/14          Passed - Patient is not pregnant      Passed - Valid encounter within last 6 months    Recent Outpatient Visits          2 months ago Primary osteoarthritis of first carpometacarpal joint of right hand   [facility] [name], MD   3 months ago Primary osteoarthritis of first carpometacarpal joint of right hand   [facility] [name], MD   3 months ago Essential hypertension   [place], Deanna C, MD   5 months ago Primary osteoarthritis of first carpometacarpal  joint of right hand   [facility] [name], MD   9 months ago Primary osteoarthritis of first carpometacarpal joint of right hand   [facility] [name], MD      Future Appointments            In 3 weeks Zigmund Daniel Earley Abide, MD [facility], [place]   In 2 months Juline Patch, MD [facility], [facility]

## 2021-08-03 ENCOUNTER — Other Ambulatory Visit: Payer: Self-pay

## 2021-08-03 DIAGNOSIS — I10 Essential (primary) hypertension: Secondary | ICD-10-CM

## 2021-08-03 MED ORDER — LOSARTAN POTASSIUM-HCTZ 100-25 MG PO TABS: 1.0000 | ORAL_TABLET | Freq: Every day | ORAL | 0 refills | Status: DC

## 2021-08-03 NOTE — Progress Notes (Signed)
Sent in another RX of [facility]- HCTZ to [facility] after pt said [facility] in [place] is saying they don't have the other 90 days on file

## 2021-08-03 NOTE — Telephone Encounter (Signed)
Pt called back about status of refill request to  [place] ( [facility] [place]) / spoke with Baxter Flattery and advised pt that Baxter Flattery will forward RX for Losartan Hydrochlorothiazide to [facility] / pt verbalized understanding

## 2021-08-25 ENCOUNTER — Ambulatory Visit: Payer: Medicare Other | Admitting: Family Medicine

## 2021-09-03 DIAGNOSIS — Z20822 Contact with and (suspected) exposure to covid-19: Secondary | ICD-10-CM | POA: Diagnosis not present

## 2021-09-10 DIAGNOSIS — Z20822 Contact with and (suspected) exposure to covid-19: Secondary | ICD-10-CM | POA: Diagnosis not present
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
## 2021-09-21 ENCOUNTER — Encounter: Payer: Self-pay | Admitting: Psychiatry

## 2021-09-21 ENCOUNTER — Telehealth (INDEPENDENT_AMBULATORY_CARE_PROVIDER_SITE_OTHER): Payer: Medicare Other | Admitting: Psychiatry

## 2021-09-21 ENCOUNTER — Other Ambulatory Visit: Payer: Self-pay

## 2021-09-21 DIAGNOSIS — F4310 Post-traumatic stress disorder, unspecified: Secondary | ICD-10-CM

## 2021-09-21 DIAGNOSIS — F3342 Major depressive disorder, recurrent, in full remission: Secondary | ICD-10-CM | POA: Diagnosis not present

## 2021-09-21 DIAGNOSIS — R5383 Other fatigue: Secondary | ICD-10-CM

## 2021-10-05 ENCOUNTER — Encounter: Payer: Self-pay | Admitting: Family Medicine

## 2021-10-05 ENCOUNTER — Ambulatory Visit (INDEPENDENT_AMBULATORY_CARE_PROVIDER_SITE_OTHER): Payer: Medicare Other | Admitting: Family Medicine

## 2021-10-05 ENCOUNTER — Other Ambulatory Visit: Payer: Self-pay

## 2021-10-05 VITALS — BP 120/62 | HR 84 | Ht 63.0 in | Wt 198.0 lb

## 2021-10-05 DIAGNOSIS — I10 Essential (primary) hypertension: Secondary | ICD-10-CM

## 2021-10-05 DIAGNOSIS — J4521 Mild intermittent asthma with (acute) exacerbation: Secondary | ICD-10-CM | POA: Diagnosis not present

## 2021-10-05 DIAGNOSIS — E782 Mixed hyperlipidemia: Secondary | ICD-10-CM

## 2021-10-05 MED ORDER — LOSARTAN POTASSIUM-HCTZ 100-25 MG PO TABS: 1.0000 | ORAL_TABLET | Freq: Every day | ORAL | 1 refills | Status: DC

## 2021-10-05 MED ORDER — ALBUTEROL SULFATE HFA 108 (90 BASE) MCG/ACT IN AERS: 2.0000 | INHALATION_SPRAY | Freq: Four times a day (QID) | RESPIRATORY_TRACT | 11 refills | Status: AC | PRN

## 2021-10-05 NOTE — Progress Notes (Signed)
? ? ?Date:  10/05/2021  ? ?Name:  Barbara Durham   DOB:  [birth year]   MRN:  [id number] ? ? ?Chief Complaint: Hypertension and Allergic Rhinitis  ? ?Hypertension ?This is a chronic problem. The current episode started more than 1 year ago. The problem has been gradually improving since onset. The problem is controlled. Pertinent negatives include no anxiety, blurred vision, chest pain, headaches, malaise/fatigue, neck pain, orthopnea, palpitations, peripheral edema, PND, shortness of breath or sweats. There are no associated agents to hypertension. Risk factors for coronary artery disease include dyslipidemia. Past treatments include angiotensin blockers and diuretics. The current treatment provides moderate improvement. There are no compliance problems.   ?Asthma ?There is no chest tightness, cough, difficulty breathing, shortness of breath, sputum production or wheezing. This is a new problem. The current episode started more than 1 year ago. The problem occurs intermittently. The problem has been gradually improving. Pertinent negatives include no chest pain, dyspnea on exertion, headaches, malaise/fatigue, nasal congestion, PND, rhinorrhea, sneezing or sweats. Her symptoms are aggravated by pollen. Her past medical history is significant for asthma.  ? ?Lab Results  ?Component Value Date  ? NA 142 04/07/2021  ? K 4.0 04/07/2021  ? CO2 23 04/07/2021  ? GLUCOSE 101 (H) 04/07/2021  ? BUN 15 04/07/2021  ? CREATININE 1.08 (H) 04/07/2021  ? CALCIUM 9.9 04/07/2021  ? EGFR 58 (L) 04/07/2021  ? GFRNONAA 58 (L) 04/07/2020  ? ?Lab Results  ?Component Value Date  ? CHOL 226 (H) 04/07/2021  ? HDL 57 04/07/2021  ? LDLCALC 142 (H) 04/07/2021  ? TRIG 152 (H) 04/07/2021  ? CHOLHDL 4.2 05/15/2017  ? ?No results found for: TSH ?No results found for: HGBA1C ?Lab Results  ?Component Value Date  ? WBC 10.6 10/29/2014  ? HGB 13.5 10/29/2014  ? HCT 39.7 10/29/2014  ? MCV 103 (H) 10/29/2014  ? PLT 187 10/29/2014  ? ?Lab Results   ?Component Value Date  ? ALT 14 10/29/2014  ? AST 19 10/29/2014  ? ALKPHOS 62 10/29/2014  ? BILITOT 0.9 10/29/2014  ? ?No results found for: 25OHVITD2, [place], VD25OH  ? ?Review of Systems  ?Constitutional:  Negative for malaise/fatigue.  ?HENT:  Negative for rhinorrhea and sneezing.   ?Eyes:  Negative for blurred vision.  ?Respiratory:  Negative for cough, sputum production, shortness of breath and wheezing.   ?Cardiovascular:  Negative for chest pain, dyspnea on exertion, palpitations, orthopnea and PND.  ?Musculoskeletal:  Negative for neck pain.  ?Neurological:  Negative for headaches.  ? ?Patient Active Problem List  ? Diagnosis Date Noted  ? Ganglion cyst of volar aspect of right wrist 10/20/2020  ? Primary osteoarthritis of first carpometacarpal joint of right hand 10/20/2020  ? Primary insomnia 03/19/2020  ? Hypertension 01/21/2015  ? Back ache 06/14/2013  ? Lumbar transverse process fracture ([place]) 06/14/2013  ? ? ?No Known Allergies ? ?Past Surgical History:  ?Procedure Laterality Date  ? arm fracture Left   ? COLONOSCOPY  2012  ? normal  ? CYSTOSCOPY  11/2011  ? DENTAL SURGERY    ? FOOT SURGERY Bilateral   ? LAPAROSCOPY  04/10/1997  ? LSO  1998  ? with LOA  ? OOPHORECTOMY    ? ORIF ORBITAL FRACTURE Left 01/16/2018  ? Procedure: OPEN REDUCTION INTERNAL FIXATION ORBITAL BLOW OUT FRACTURE;  Surgeon: Clyde Canterbury, MD;  Location: [place];  Service: ENT;  Laterality: Left;  [place] SET    ?WIRE MESH  ? TOTAL ABDOMINAL HYSTERECTOMY  1993  ? with RSO  ? TUBAL LIGATION  1989  ? ? ?Social History  ? ?Tobacco Use  ? Smoking status: Never  ? Smokeless tobacco: Never  ?Vaping Use  ? Vaping Use: Never used  ?Substance Use Topics  ? Alcohol use: Not Currently  ?  Alcohol/week: 0.0 - 1.0 standard drinks  ? Drug use: No  ? ? ? ?Medication list has been reviewed and updated. ? ?Current Meds  ?Medication Sig  ? albuterol (VENTOLIN HFA) 108 (90 Base) MCG/ACT inhaler Inhale 2 puffs into the lungs every 6  (six) hours as needed for wheezing or shortness of breath.  ? Aspirin-Caffeine (BC FAST PAIN RELIEF ARTHRITIS PO) Take 1 packet by mouth in the morning and at bedtime.  ? Diclofenac Sodium 2 % SOLN Place 1 spray onto the skin 2 (two) times daily as needed.  ? losartan-hydrochlorothiazide (HYZAAR) 100-25 MG tablet Take 1 tablet by mouth daily.  ? vortioxetine HBr (TRINTELLIX) 20 MG TABS tablet Take 1 tablet (20 mg total) by mouth daily. Pt is on PAP  ? ? ?PHQ 2/9 Scores 10/05/2021 05/25/2021 05/17/2021 04/20/2021  ?PHQ - 2 Score 0 0 0 0  ?PHQ- 9 Score 0 0 0 0  ?Some encounter information is confidential and restricted. Go to Review Flowsheets activity to see all data.  ? ? ?GAD 7 : Generalized Anxiety Score 10/05/2021 05/25/2021 04/20/2021 04/07/2021  ?Nervous, Anxious, on Edge 0 0 0 0  ?Control/stop worrying 0 0 0 0  ?Worry too much - different things 0 0 0 0  ?Trouble relaxing 0 0 0 0  ?Restless 0 0 0 0  ?Easily annoyed or irritable 0 0 0 0  ?Afraid - awful might happen 0 0 0 0  ?Total GAD 7 Score 0 0 0 0  ?Anxiety Difficulty Not difficult at all Not difficult at all Not difficult at all -  ? ? ?BP Readings from Last 3 Encounters:  ?10/05/21 120/62  ?05/25/21 116/86  ?05/17/21 112/72  ? ? ?Physical Exam ? ?Wt Readings from Last 3 Encounters:  ?10/05/21 198 lb (89.8 kg)  ?05/25/21 193 lb (87.5 kg)  ?05/17/21 192 lb 3.2 oz (87.2 kg)  ? ? ?BP 120/62   Pulse 84   Ht '5\' 3"'  (1.6 m)   Wt 198 lb (89.8 kg)   BMI 35.07 kg/m?  ? ?Assessment and Plan: ? ?1. Essential hypertension ?Chronic.  Controlled.  Stable.  Blood pressure is 120/62.  Continue losartan hydrochlorothiazide 100-25 mg once a day.  Will check renal function panel for electrolytes and GFR. ?- losartan-hydrochlorothiazide (HYZAAR) 100-25 MG tablet; Take 1 tablet by mouth daily.  Dispense: 90 tablet; Refill: 1 ?- Renal Function Panel ? ?2. Mild intermittent reactive airway disease with acute exacerbation ?Chronic.  Intermittent.  Stable.  Controlled on beta agonist  albuterol 1 to 2 puffs every 6 hours as needed wheezing shortness of breath and patient has been encouraged during the high pollen counts to go ahead and initiate at least twice a day. ?- albuterol (VENTOLIN HFA) 108 (90 Base) MCG/ACT inhaler; Inhale 2 puffs into the lungs every 6 (six) hours as needed for wheezing or shortness of breath.  Dispense: 1 each; Refill: 11 ? ?3. Mixed hyperlipidemia ?Chronic.  Controlled.  Stable.  Patient will continue with diet control but will check lipid panel to see if this is adequate at this time. ?- Lipid Panel With LDL/HDL Ratio  ? ? ?

## 2021-10-06 LAB — RENAL FUNCTION PANEL
Albumin: 4.6 g/dL (ref 3.8–4.8)
BUN/Creatinine Ratio: 14 (ref 12–28)
BUN: 13 mg/dL (ref 8–27)
CO2: 24 mmol/L (ref 20–29)
Calcium: 9.7 mg/dL (ref 8.7–10.3)
Chloride: 98 mmol/L (ref 96–106)
Creatinine, Ser: 0.95 mg/dL (ref 0.57–1.00)
Glucose: 94 mg/dL (ref 70–99)
Phosphorus: 3.8 mg/dL (ref 3.0–4.3)
Potassium: 4.1 mmol/L (ref 3.5–5.2)
Sodium: 144 mmol/L (ref 134–144)
eGFR: 68 mL/min/{1.73_m2} (ref >59)

## 2021-10-06 LAB — LIPID PANEL WITH LDL/HDL RATIO
Cholesterol, Total: 225 mg/dL — ABNORMAL HIGH (ref 100–199)
HDL: 53 mg/dL (ref >39)
LDL Chol Calc (NIH): 138 mg/dL — ABNORMAL HIGH (ref 0–99)
LDL/HDL Ratio: 2.6 ratio (ref 0.0–3.2)
Triglycerides: 193 mg/dL — ABNORMAL HIGH (ref 0–149)
VLDL Cholesterol Cal: 34 mg/dL (ref 5–40)

## 2021-10-09 DIAGNOSIS — Z20822 Contact with and (suspected) exposure to covid-19: Secondary | ICD-10-CM | POA: Diagnosis not present

## 2021-10-22 DIAGNOSIS — Z20822 Contact with and (suspected) exposure to covid-19: Secondary | ICD-10-CM | POA: Diagnosis not present

## 2021-10-24 DIAGNOSIS — Z20822 Contact with and (suspected) exposure to covid-19: Secondary | ICD-10-CM | POA: Diagnosis not present

## 2021-10-26 ENCOUNTER — Ambulatory Visit (INDEPENDENT_AMBULATORY_CARE_PROVIDER_SITE_OTHER): Payer: Medicare Other | Admitting: Family Medicine

## 2021-10-26 ENCOUNTER — Encounter: Payer: Self-pay | Admitting: Family Medicine

## 2021-10-26 VITALS — BP 138/74 | HR 83 | Temp 98.1°F | Ht 63.0 in | Wt 191.0 lb

## 2021-10-26 DIAGNOSIS — R051 Acute cough: Secondary | ICD-10-CM

## 2021-10-26 DIAGNOSIS — J40 Bronchitis, not specified as acute or chronic: Secondary | ICD-10-CM

## 2021-10-26 DIAGNOSIS — J4521 Mild intermittent asthma with (acute) exacerbation: Secondary | ICD-10-CM | POA: Diagnosis not present

## 2021-10-26 MED ORDER — AZITHROMYCIN 250 MG PO TABS: ORAL_TABLET | ORAL | 0 refills | Status: AC

## 2021-10-26 MED ORDER — PREDNISONE 10 MG PO TABS: 10.0000 mg | ORAL_TABLET | Freq: Every day | ORAL | 0 refills | Status: DC

## 2021-10-26 MED ORDER — GUAIFENESIN-CODEINE 100-10 MG/5ML PO SYRP: 5.0000 mL | ORAL_SOLUTION | Freq: Three times a day (TID) | ORAL | 0 refills | Status: DC | PRN

## 2021-10-26 NOTE — Progress Notes (Signed)
? ? ?Date:  10/26/2021  ? ?Name:  Barbara Durham   DOB:  Mar 17, 1961   MRN:  [id number] ? ? ?Chief Complaint: Cough (Last few days. No production. In chest. No fever, shortness of breath. ) ? ?Cough ?This is a new problem. The current episode started in the past 7 days. The problem has been gradually improving. The problem occurs every few minutes. The cough is Non-productive. Associated symptoms include postnasal drip and wheezing. Pertinent negatives include no chest pain, nasal congestion, rhinorrhea, sore throat or shortness of breath. She has tried a beta-agonist inhaler for the symptoms. The treatment provided mild relief.  ?Sinusitis ?This is a new problem. The current episode started in the past 7 days. The problem has been waxing and waning since onset. There has been no fever. Associated symptoms include congestion, coughing and sneezing. Pertinent negatives include no shortness of breath, sinus pressure or sore throat.  ? ?Lab Results  ?Component Value Date  ? NA 144 10/05/2021  ? K 4.1 10/05/2021  ? CO2 24 10/05/2021  ? GLUCOSE 94 10/05/2021  ? BUN 13 10/05/2021  ? CREATININE 0.95 10/05/2021  ? CALCIUM 9.7 10/05/2021  ? EGFR 68 10/05/2021  ? GFRNONAA 58 (L) 04/07/2020  ? ?Lab Results  ?Component Value Date  ? CHOL 225 (H) 10/05/2021  ? HDL 53 10/05/2021  ? LDLCALC 138 (H) 10/05/2021  ? TRIG 193 (H) 10/05/2021  ? CHOLHDL 4.2 05/15/2017  ? ?No results found for: TSH ?No results found for: HGBA1C ?Lab Results  ?Component Value Date  ? WBC 10.6 10/29/2014  ? HGB 13.5 10/29/2014  ? HCT 39.7 10/29/2014  ? MCV 103 (H) 10/29/2014  ? PLT 187 10/29/2014  ? ?Lab Results  ?Component Value Date  ? ALT 14 10/29/2014  ? AST 19 10/29/2014  ? ALKPHOS 62 10/29/2014  ? BILITOT 0.9 10/29/2014  ? ?No results found for: 25OHVITD2, [place], VD25OH  ? ?Review of Systems  ?HENT:  Positive for congestion, postnasal drip and sneezing. Negative for rhinorrhea, sinus pressure and sore throat.   ?Respiratory:  Positive for cough  and wheezing. Negative for shortness of breath.   ?Cardiovascular:  Negative for chest pain and leg swelling.  ? ?Patient Active Problem List  ? Diagnosis Date Noted  ? Ganglion cyst of volar aspect of right wrist 10/20/2020  ? Primary osteoarthritis of first carpometacarpal joint of right hand 10/20/2020  ? Primary insomnia 03/19/2020  ? Hypertension 01/21/2015  ? Back ache 06/14/2013  ? Lumbar transverse process fracture ([place]) 06/14/2013  ? ? ?No Known Allergies ? ?Past Surgical History:  ?Procedure Laterality Date  ? arm fracture Left   ? COLONOSCOPY  2012  ? normal  ? CYSTOSCOPY  11/2011  ? DENTAL SURGERY    ? FOOT SURGERY Bilateral   ? LAPAROSCOPY  04/10/1997  ? LSO  1998  ? with LOA  ? OOPHORECTOMY    ? ORIF ORBITAL FRACTURE Left 01/16/2018  ? Procedure: OPEN REDUCTION INTERNAL FIXATION ORBITAL BLOW OUT FRACTURE;  Surgeon: Clyde Canterbury, MD;  Location: [facility];  Service: ENT;  Laterality: Left;  LEIBINGER SET    ?WIRE MESH  ? TOTAL ABDOMINAL HYSTERECTOMY  1993  ? with RSO  ? TUBAL LIGATION  1989  ? ? ?Social History  ? ?Tobacco Use  ? Smoking status: Never  ? Smokeless tobacco: Never  ?Vaping Use  ? Vaping Use: Never used  ?Substance Use Topics  ? Alcohol use: Not Currently  ?  Alcohol/week: 0.0 -  1.0 standard drinks  ? Drug use: No  ? ? ? ?Medication list has been reviewed and updated. ? ?Current Meds  ?Medication Sig  ? albuterol (VENTOLIN HFA) 108 (90 Base) MCG/ACT inhaler Inhale 2 puffs into the lungs every 6 (six) hours as needed for wheezing or shortness of breath.  ? Aspirin-Caffeine (BC FAST PAIN RELIEF ARTHRITIS PO) Take 1 packet by mouth in the morning and at bedtime.  ? Diclofenac Sodium 2 % SOLN Place 1 spray onto the skin 2 (two) times daily as needed.  ? losartan-hydrochlorothiazide (HYZAAR) 100-25 MG tablet Take 1 tablet by mouth daily.  ? vortioxetine HBr (TRINTELLIX) 20 MG TABS tablet Take 1 tablet (20 mg total) by mouth daily. Pt is on PAP  ? ? ? ?  10/26/2021  ?  3:52 PM 10/05/2021   ? 10:12 AM 05/25/2021  ? 11:38 AM 04/20/2021  ?  8:06 AM  ?GAD 7 : Generalized Anxiety Score  ?Nervous, Anxious, on Edge 0 0 0 0  ?Control/stop worrying 0 0 0 0  ?Worry too much - different things 0 0 0 0  ?Trouble relaxing 0 0 0 0  ?Restless 0 0 0 0  ?Easily annoyed or irritable 0 0 0 0  ?Afraid - awful might happen 0 0 0 0  ?Total GAD 7 Score 0 0 0 0  ?Anxiety Difficulty Not difficult at all Not difficult at all Not difficult at all Not difficult at all  ? ? ? ?  10/26/2021  ?  3:52 PM  ?Depression screen PHQ 2/9  ?Decreased Interest 0  ?Down, Depressed, Hopeless 0  ?PHQ - 2 Score 0  ?Altered sleeping 0  ?Tired, decreased energy 0  ?Change in appetite 0  ?Feeling bad or failure about yourself  0  ?Trouble concentrating 0  ?Moving slowly or fidgety/restless 0  ?Suicidal thoughts 0  ?PHQ-9 Score 0  ?Difficult doing work/chores Not difficult at all  ? ? ?BP Readings from Last 3 Encounters:  ?10/26/21 138/74  ?10/05/21 120/62  ?05/25/21 116/86  ? ? ?Physical Exam ?Vitals and nursing note reviewed.  ?Constitutional:   ?   Appearance: She is well-developed.  ?HENT:  ?   Head: Normocephalic.  ?   Right Ear: External ear normal.  ?   Left Ear: External ear normal.  ?Eyes:  ?   General: Lids are everted, no foreign bodies appreciated. No scleral icterus.    ?   Left eye: No foreign body or hordeolum.  ?   Conjunctiva/sclera: Conjunctivae normal.  ?   Right eye: Right conjunctiva is not injected.  ?   Left eye: Left conjunctiva is not injected.  ?   Pupils: Pupils are equal, round, and reactive to light.  ?Neck:  ?   Thyroid: No thyromegaly.  ?   Vascular: No JVD.  ?   Trachea: No tracheal deviation.  ?Cardiovascular:  ?   Rate and Rhythm: Normal rate and regular rhythm.  ?   Heart sounds: Normal heart sounds, S1 normal and S2 normal. No murmur heard. ?No systolic murmur is present.  ?No diastolic murmur is present.  ?  No friction rub. No gallop. No S3 or S4 sounds.  ?Pulmonary:  ?   Effort: Pulmonary effort is normal. No  respiratory distress.  ?   Breath sounds: Normal breath sounds. No decreased breath sounds, wheezing, rhonchi or rales.  ?Abdominal:  ?   General: Bowel sounds are normal.  ?   Palpations: Abdomen is soft. There is  no mass.  ?   Tenderness: There is no abdominal tenderness. There is no guarding or rebound.  ?Musculoskeletal:     ?   General: No tenderness. Normal range of motion.  ?   Cervical back: Normal range of motion and neck supple.  ?Lymphadenopathy:  ?   Cervical: No cervical adenopathy.  ?Skin: ?   General: Skin is warm.  ?   Findings: No rash.  ?Neurological:  ?   Mental Status: She is alert and oriented to person, place, and time.  ?   Cranial Nerves: No cranial nerve deficit.  ?   Deep Tendon Reflexes: Reflexes normal.  ?Psychiatric:     ?   Mood and Affect: Mood is not anxious or depressed.  ? ? ?Wt Readings from Last 3 Encounters:  ?10/26/21 191 lb (86.6 kg)  ?10/05/21 198 lb (89.8 kg)  ?05/25/21 193 lb (87.5 kg)  ? ? ?BP 138/74   Pulse 83   Temp 98.1 ?F (36.7 ?C) (Oral)   Ht '5\' 3"'  (1.6 m)   Wt 191 lb (86.6 kg)   SpO2 98%   BMI 33.83 kg/m?  ? ?Assessment and Plan: ? ?1. Mild intermittent reactive airway disease with acute exacerbation ?Chronic.  Exacerbated.  Intermittent.  And mild in nature.  But currently has been more wheezing than baseline.  Patient has been given a sample of Breztri to take 1 puff in the morning beginning today.  Patient is also been put on prednisone to take once a day.  We will also encouraged to use her albuterol inhaler 1 to 2 puffs every 6-8 hours.  We will recheck in 1 week ?- predniSONE (DELTASONE) 10 MG tablet; Take 1 tablet (10 mg total) by mouth daily with breakfast.  Dispense: 30 tablet; Refill: 0 ? ?2. Bronchitis ?Chronic.  Controlled.  Stable.  There is some facial discomfort that may suggest sinusitis but this is an acute exacerbation of her asthma that we will treat with azithromycin to 50 mg 2 today followed by 1 a day for 5 days. ?- azithromycin (ZITHROMAX)  250 MG tablet; Take 2 tablets on day 1, then 1 tablet daily on days 2 through 5  Dispense: 6 tablet; Refill: 0 ? ?3. Acute cough ?Patient has acute cough that is having difficulty with producing anything.  We w

## 2021-11-02 ENCOUNTER — Ambulatory Visit (INDEPENDENT_AMBULATORY_CARE_PROVIDER_SITE_OTHER): Payer: Medicare Other | Admitting: Family Medicine

## 2021-11-02 ENCOUNTER — Encounter: Payer: Self-pay | Admitting: Family Medicine

## 2021-11-02 VITALS — BP 122/80 | HR 84 | Ht 63.0 in | Wt 191.0 lb

## 2021-11-02 DIAGNOSIS — Z90710 Acquired absence of both cervix and uterus: Secondary | ICD-10-CM

## 2021-11-02 DIAGNOSIS — J4521 Mild intermittent asthma with (acute) exacerbation: Secondary | ICD-10-CM

## 2021-11-02 NOTE — Progress Notes (Signed)
? ? ?Date:  11/02/2021  ? ?Name:  Barbara Durham   DOB:  01-Oct-1960   MRN:  [id number] ? ? ?Chief Complaint: Cough (Better- still taking pred and inhaler- finished Zpack- cough is remaining, but not as bad) and ref gyn (Ref placed for 3 year follow up pap) ? ?Cough ?This is a new problem. The problem has been gradually improving. The cough is Non-productive. Pertinent negatives include no chest pain, chills, ear pain, fever, headaches, hemoptysis, myalgias, nasal congestion, postnasal drip, rash, rhinorrhea, sore throat, shortness of breath or wheezing. She has tried a beta-agonist inhaler, oral steroids and steroid inhaler for the symptoms. The treatment provided moderate relief. Her past medical history is significant for asthma. There is no history of environmental allergies.  ?Asthma ?She complains of cough. There is no hemoptysis, shortness of breath or wheezing. Pertinent negatives include no chest pain, ear pain, fever, headaches, myalgias, nasal congestion, postnasal drip, rhinorrhea or sore throat. Her past medical history is significant for asthma.  ? ?Lab Results  ?Component Value Date  ? NA 144 10/05/2021  ? K 4.1 10/05/2021  ? CO2 24 10/05/2021  ? GLUCOSE 94 10/05/2021  ? BUN 13 10/05/2021  ? CREATININE 0.95 10/05/2021  ? CALCIUM 9.7 10/05/2021  ? EGFR 68 10/05/2021  ? GFRNONAA 58 (L) 04/07/2020  ? ?Lab Results  ?Component Value Date  ? CHOL 225 (H) 10/05/2021  ? HDL 53 10/05/2021  ? LDLCALC 138 (H) 10/05/2021  ? TRIG 193 (H) 10/05/2021  ? CHOLHDL 4.2 05/15/2017  ? ?No results found for: TSH ?No results found for: HGBA1C ?Lab Results  ?Component Value Date  ? WBC 10.6 10/29/2014  ? HGB 13.5 10/29/2014  ? HCT 39.7 10/29/2014  ? MCV 103 (H) 10/29/2014  ? PLT 187 10/29/2014  ? ?Lab Results  ?Component Value Date  ? ALT 14 10/29/2014  ? AST 19 10/29/2014  ? ALKPHOS 62 10/29/2014  ? BILITOT 0.9 10/29/2014  ? ?No results found for: 25OHVITD2, [place], VD25OH  ? ?Review of Systems  ?Constitutional:   Negative for chills and fever.  ?HENT:  Negative for drooling, ear discharge, ear pain, postnasal drip, rhinorrhea and sore throat.   ?Respiratory:  Positive for cough. Negative for hemoptysis, shortness of breath and wheezing.   ?Cardiovascular:  Negative for chest pain, palpitations and leg swelling.  ?Gastrointestinal:  Negative for abdominal pain, blood in stool, constipation, diarrhea and nausea.  ?Endocrine: Negative for polydipsia.  ?Genitourinary:  Negative for dysuria, frequency, hematuria and urgency.  ?Musculoskeletal:  Negative for back pain, myalgias and neck pain.  ?Skin:  Negative for rash.  ?Allergic/Immunologic: Negative for environmental allergies.  ?Neurological:  Negative for dizziness and headaches.  ?Hematological:  Does not bruise/bleed easily.  ?Psychiatric/Behavioral:  Negative for suicidal ideas. The patient is not nervous/anxious.   ? ?Patient Active Problem List  ? Diagnosis Date Noted  ? History of total hysterectomy 11/02/2021  ? Ganglion cyst of volar aspect of right wrist 10/20/2020  ? Primary osteoarthritis of first carpometacarpal joint of right hand 10/20/2020  ? Primary insomnia 03/19/2020  ? Hypertension 01/21/2015  ? Back ache 06/14/2013  ? Lumbar transverse process fracture ([place]) 06/14/2013  ? ? ?No Known Allergies ? ?Past Surgical History:  ?Procedure Laterality Date  ? arm fracture Left   ? COLONOSCOPY  2012  ? normal  ? CYSTOSCOPY  11/2011  ? DENTAL SURGERY    ? FOOT SURGERY Bilateral   ? LAPAROSCOPY  04/10/1997  ? LSO  1998  ?  with LOA  ? OOPHORECTOMY    ? ORIF ORBITAL FRACTURE Left 01/16/2018  ? Procedure: OPEN REDUCTION INTERNAL FIXATION ORBITAL BLOW OUT FRACTURE;  Surgeon: Clyde Canterbury, MD;  Location: [facility];  Service: ENT;  Laterality: Left;  LEIBINGER SET    ?WIRE MESH  ? TOTAL ABDOMINAL HYSTERECTOMY  1993  ? with RSO  ? TUBAL LIGATION  1989  ? ? ?Social History  ? ?Tobacco Use  ? Smoking status: Never  ? Smokeless tobacco: Never  ?Vaping Use  ? Vaping  Use: Never used  ?Substance Use Topics  ? Alcohol use: Not Currently  ?  Alcohol/week: 0.0 - 1.0 standard drinks  ? Drug use: No  ? ? ? ?Medication list has been reviewed and updated. ? ?Current Meds  ?Medication Sig  ? albuterol (VENTOLIN HFA) 108 (90 Base) MCG/ACT inhaler Inhale 2 puffs into the lungs every 6 (six) hours as needed for wheezing or shortness of breath.  ? Aspirin-Caffeine (BC FAST PAIN RELIEF ARTHRITIS PO) Take 1 packet by mouth in the morning and at bedtime.  ? Diclofenac Sodium 2 % SOLN Place 1 spray onto the skin 2 (two) times daily as needed.  ? guaiFENesin-codeine (ROBITUSSIN AC) 100-10 MG/5ML syrup Take 5 mLs by mouth 3 (three) times daily as needed for cough.  ? losartan-hydrochlorothiazide (HYZAAR) 100-25 MG tablet Take 1 tablet by mouth daily.  ? predniSONE (DELTASONE) 10 MG tablet Take 10 mg by mouth daily with breakfast.  ? vortioxetine HBr (TRINTELLIX) 20 MG TABS tablet Take 1 tablet (20 mg total) by mouth daily. Pt is on PAP  ? [DISCONTINUED] predniSONE (DELTASONE) 10 MG tablet Take 1 tablet (10 mg total) by mouth daily with breakfast.  ? ? ? ?  11/02/2021  ? 10:50 AM 10/26/2021  ?  3:52 PM 10/05/2021  ? 10:12 AM 05/25/2021  ? 11:38 AM  ?GAD 7 : Generalized Anxiety Score  ?Nervous, Anxious, on Edge 0 0 0 0  ?Control/stop worrying 0 0 0 0  ?Worry too much - different things 0 0 0 0  ?Trouble relaxing 0 0 0 0  ?Restless 0 0 0 0  ?Easily annoyed or irritable 0 0 0 0  ?Afraid - awful might happen 0 0 0 0  ?Total GAD 7 Score 0 0 0 0  ?Anxiety Difficulty Not difficult at all Not difficult at all Not difficult at all Not difficult at all  ? ? ? ?  11/02/2021  ? 10:50 AM  ?Depression screen PHQ 2/9  ?Decreased Interest 0  ?Down, Depressed, Hopeless 0  ?PHQ - 2 Score 0  ?Altered sleeping 0  ?Tired, decreased energy 0  ?Change in appetite 0  ?Feeling bad or failure about yourself  0  ?Trouble concentrating 0  ?Moving slowly or fidgety/restless 0  ?Suicidal thoughts 0  ?PHQ-9 Score 0  ?Difficult doing  work/chores Not difficult at all  ? ? ?BP Readings from Last 3 Encounters:  ?11/02/21 122/80  ?10/26/21 138/74  ?10/05/21 120/62  ? ? ?Physical Exam ?Vitals and nursing note reviewed. Exam conducted with a chaperone present.  ?Constitutional:   ?   General: She is not in acute distress. ?   Appearance: She is not diaphoretic.  ?HENT:  ?   Head: Normocephalic and atraumatic.  ?   Right Ear: External ear normal.  ?   Left Ear: External ear normal.  ?   Nose: Nose normal.  ?Eyes:  ?   General:     ?  Right eye: No discharge.     ?   Left eye: No discharge.  ?   Conjunctiva/sclera: Conjunctivae normal.  ?   Pupils: Pupils are equal, round, and reactive to light.  ?Neck:  ?   Thyroid: No thyromegaly.  ?   Vascular: No JVD.  ?Cardiovascular:  ?   Rate and Rhythm: Normal rate and regular rhythm.  ?   Pulses: Normal pulses.  ?   Heart sounds: Normal heart sounds, S1 normal and S2 normal. No murmur heard. ?No systolic murmur is present.  ?No diastolic murmur is present.  ?  No friction rub. No gallop. No S3 or S4 sounds.  ?Pulmonary:  ?   Effort: Pulmonary effort is normal.  ?   Breath sounds: Normal breath sounds. No decreased breath sounds, wheezing, rhonchi or rales.  ?Abdominal:  ?   General: Bowel sounds are normal.  ?   Palpations: Abdomen is soft. There is no mass.  ?   Tenderness: There is no abdominal tenderness. There is no guarding.  ?Musculoskeletal:     ?   General: Normal range of motion.  ?   Cervical back: Normal range of motion and neck supple.  ?Lymphadenopathy:  ?   Cervical: No cervical adenopathy.  ?Skin: ?   General: Skin is warm and dry.  ?Neurological:  ?   Mental Status: She is alert.  ?   Deep Tendon Reflexes: Reflexes are normal and symmetric.  ? ? ?Wt Readings from Last 3 Encounters:  ?11/02/21 191 lb (86.6 kg)  ?10/26/21 191 lb (86.6 kg)  ?10/05/21 198 lb (89.8 kg)  ? ? ?BP 122/80   Pulse 84   Ht _0  (1.6 m)   Wt 191 lb (86.6 kg)   BMI 33.83 kg/m?  ? ?Assessment and Plan: ? ?1. Mild  intermittent reactive airway disease with acute exacerbation ?New onset.  Mild.  Intermittent.  Currently is improving from previous encounter but had generalized wheezing.  Patient is more careful to wear a mask in out

## 2021-11-08 ENCOUNTER — Telehealth: Payer: Self-pay

## 2021-11-08 DIAGNOSIS — Z20822 Contact with and (suspected) exposure to covid-19: Secondary | ICD-10-CM | POA: Diagnosis not present

## 2021-11-08 NOTE — Telephone Encounter (Signed)
[facility] referring for annual exam with pap, hx of total hysterectomy sch w CNM/PA- 4-6 weeks. Called and left voicemail for patient to call back to be scheduled.

## 2021-11-08 NOTE — Telephone Encounter (Signed)
-----   Message from Excell Seltzer, RN sent at 11/03/2021 11:25 AM EDT ----- Regarding: referral Schedule with CNM/PA for annual exam with pap, hx of total hysterectomy - 4-6 weeks.

## 2021-11-09 NOTE — Telephone Encounter (Signed)
Called and left voicemail for patient to call back to be scheduled. 

## 2021-11-09 NOTE — Telephone Encounter (Signed)
Patient is scheduled for 12/13/21 with [facility]

## 2021-11-20 DIAGNOSIS — Z20822 Contact with and (suspected) exposure to covid-19: Secondary | ICD-10-CM | POA: Diagnosis not present

## 2021-11-26 DIAGNOSIS — Z20822 Contact with and (suspected) exposure to covid-19: Secondary | ICD-10-CM | POA: Diagnosis not present

## 2021-11-28 DIAGNOSIS — Z20822 Contact with and (suspected) exposure to covid-19: Secondary | ICD-10-CM | POA: Diagnosis not present

## 2021-11-29 DIAGNOSIS — Z20822 Contact with and (suspected) exposure to covid-19: Secondary | ICD-10-CM | POA: Diagnosis not present

## 2021-12-12 NOTE — Progress Notes (Signed)
PCP: Juline Patch, MD   Chief Complaint  Patient presents with   Gynecologic Exam    No concerns    HPI:      Ms. Barbara Durham is a 61 y.o. No obstetric history on file. who LMP was No LMP recorded. Patient has had a hysterectomy., presents today for her NP>3 yrs MEDICARE annual examination.  Her menses are absent due to [facility] 1993 for endometriosis. LSO 1998 (early menopause). Dysmenorrhea none. She does not have PMB. She does not have vasomotor sx.   Sex activity: not sexually active. She does not have vaginal dryness.  Last Pap: 08/20/18 Results were: no abnormalities /neg HPV DNA.  Hx of STDs: none  Last mammogram: 06/09/21 Results were normal, repeat in 12 months. There is a FH of breast cancer in her pat aunt, genetic testing not indicated. There is no FH of ovarian cancer. The patient does not do self-breast exams.  Colonoscopy: colonoscopy 2013 at [facility] with Dr. Dionne Milo. Results unknown. Repeat due ?Marland Kitchen DEXA: 2013 and 6/20 at [facility] with osteopenia in hips. Sister with osteoporosis.   Tobacco use: The patient denies current or previous tobacco use. Alcohol use: none No drug use Exercise: not active  She does get adequate calcium but not Vitamin D in her diet.  Labs with PCP.  Pt still has issues with OAB/urge incont. Tried a couple meds in past but is very sensitive to meds. Saw urology with neg eval. Denies caffeine use. Drinks lots of water.   Past Medical History:  Diagnosis Date   Anxiety    Depression    Endometriosis    GERD (gastroesophageal reflux disease)    Hemorrhoids    Hypertension    Nocturia    enuresis   Osteopenia 10/2009, 6/20   in spine and hip   PONV (postoperative nausea and vomiting)    Stress incontinence, female     Past Surgical History:  Procedure Laterality Date   arm fracture Left    COLONOSCOPY  2012   normal   CYSTOSCOPY  11/2011   DENTAL SURGERY     FOOT SURGERY Bilateral    LAPAROSCOPY   04/10/1997   LSO  1998   with LOA   OOPHORECTOMY     ORIF ORBITAL FRACTURE Left 01/16/2018   Procedure: OPEN REDUCTION INTERNAL FIXATION ORBITAL BLOW OUT FRACTURE;  Surgeon: Clyde Canterbury, MD;  Location: [place];  Service: ENT;  Laterality: Left;  [place]   with [place]    Family History  Problem Relation Age of Onset   Heart disease Mother    Hypertension Mother    Dementia Mother    Depression Mother    Diabetes Mother        type 2   Alzheimer's disease Father    Stroke Father    CVA Father    Prostate cancer Father    Heart disease Sister    Osteoporosis Sister    Arthritis Brother    Depression Brother    Depression Brother    Gout Brother    Hypertension Brother    Breast cancer Paternal Aunt        75s    Social History   Socioeconomic History   Marital status: Divorced    Spouse name: Not on file   Number of children: 1   Years of education: Not on file  Highest education level: Not on file  Occupational History   Not on file  Tobacco Use   Smoking status: Never   Smokeless tobacco: Never  Vaping Use   Vaping Use: Never used  Substance and Sexual Activity   Alcohol use: Not Currently    Alcohol/week: 0.0 - 1.0 standard drinks   Drug use: No   Sexual activity: Not Currently    Partners: Male    Birth control/protection: None  Other Topics Concern   Not on file  Social History Narrative   Pt lives alone   Social Determinants of Health   Financial Resource Strain: Low Risk    Difficulty of Paying Living Expenses: Not hard at all  Food Insecurity: No Food Insecurity   Worried About Charity fundraiser in the Last Year: Never true   Arboriculturist in the Last Year: Never true  Transportation Needs: Not on file  Physical Activity: Inactive   Days of Exercise per Week: 0 days   Minutes of Exercise per Session: 0 min  Stress: No Stress Concern Present    Feeling of Stress : Not at all  Social Connections: Socially Isolated   Frequency of Communication with Friends and Family: More than three times a week   Frequency of Social Gatherings with Friends and Family: More than three times a week   Attends Religious Services: Never   Marine scientist or Organizations: No   Attends Music therapist: Never   Marital Status: Divorced  Human resources officer Violence: Not At Risk   Fear of Current or Ex-Partner: No   Emotionally Abused: No   Physically Abused: No   Sexually Abused: No    Outpatient Medications Prior to Visit  Medication Sig Dispense Refill   albuterol (VENTOLIN HFA) 108 (90 Base) MCG/ACT inhaler Inhale 2 puffs into the lungs every 6 (six) hours as needed for wheezing or shortness of breath. 1 each 11   Aspirin-Caffeine (BC FAST PAIN RELIEF ARTHRITIS PO) Take 1 packet by mouth in the morning and at bedtime.     Diclofenac Sodium 2 % SOLN Place 1 spray onto the skin 2 (two) times daily as needed. 112 g 0   losartan-hydrochlorothiazide (HYZAAR) 100-25 MG tablet Take 1 tablet by mouth daily. 90 tablet 1   vortioxetine HBr (TRINTELLIX) 20 MG TABS tablet Take 1 tablet (20 mg total) by mouth daily. Pt is on PAP 90 tablet 3   guaiFENesin-codeine (ROBITUSSIN AC) 100-10 MG/5ML syrup Take 5 mLs by mouth 3 (three) times daily as needed for cough. 118 mL 0   predniSONE (DELTASONE) 10 MG tablet Take 10 mg by mouth daily with breakfast.     No facility-administered medications prior to visit.     ROS:  Review of Systems  Constitutional:  Negative for fatigue, fever and unexpected weight change.  Respiratory:  Negative for cough, shortness of breath and wheezing.   Cardiovascular:  Negative for chest pain, palpitations and leg swelling.  Gastrointestinal:  Negative for blood in stool, constipation, diarrhea, nausea and vomiting.  Endocrine: Negative for cold intolerance, heat intolerance and polyuria.  Genitourinary:  Negative  for dyspareunia, dysuria, flank pain, frequency, genital sores, hematuria, menstrual problem, pelvic pain, urgency, vaginal bleeding, vaginal discharge and vaginal pain.  Musculoskeletal:  Negative for back pain, joint swelling and myalgias.  Skin:  Negative for rash.  Neurological:  Negative for dizziness, syncope, light-headedness, numbness and headaches.  Hematological:  Negative for adenopathy.  Psychiatric/Behavioral:  Negative for  agitation, confusion, sleep disturbance and suicidal ideas. The patient is not nervous/anxious.   BREAST: No symptoms    Objective: BP 110/70   Ht '5\' 2"'$  (1.575 m)   Wt 194 lb (88 kg)   BMI 35.48 kg/m    Physical Exam Constitutional:      Appearance: She is well-developed.  Genitourinary:     Genitourinary Comments: UTERUS/CX SURG REM     No vaginal discharge, erythema or tenderness.      Right Adnexa: not tender and no mass present.    Left Adnexa: not tender and no mass present. Breasts:    Right: No mass, nipple discharge, skin change or tenderness.     Left: No mass, nipple discharge, skin change or tenderness.  Neck:     Thyroid: No thyromegaly.  Cardiovascular:     Rate and Rhythm: Normal rate and regular rhythm.     Heart sounds: Normal heart sounds. No murmur heard. Pulmonary:     Effort: Pulmonary effort is normal.     Breath sounds: Normal breath sounds.  Abdominal:     Palpations: Abdomen is soft.     Tenderness: There is no abdominal tenderness. There is no guarding.  Musculoskeletal:        General: Normal range of motion.     Cervical back: Normal range of motion.  Neurological:     Mental Status: She is alert and oriented to person, place, and time.     Cranial Nerves: No cranial nerve deficit.  Psychiatric:        Behavior: Behavior normal.  Vitals reviewed.    Assessment/Plan:  Encounter for annual routine gynecological examination  Encounter for screening mammogram for malignant neoplasm of breast - Plan: MM 3D  SCREEN BREAST BILATERAL; pt to schedule mammo 11/23  Screening for colon cancer - Plan: Ambulatory referral to Gastroenterology; refer to GI  Osteopenia of hip, unspecified laterality - Plan: DG Bone Density; pt to schedule DEXA with mammo 11/23.  Estrogen deficiency - Plan: DG Bone Density          GYN counsel breast self exam, mammography screening, menopause, adequate intake of calcium and vitamin D, diet and exercise    F/U  Return in about 2 years (around 12/14/2023).  Margaretha Mahan B. Tykiera Raven, PA-C 12/13/2021 11:14 AM

## 2021-12-13 ENCOUNTER — Ambulatory Visit (INDEPENDENT_AMBULATORY_CARE_PROVIDER_SITE_OTHER): Payer: Medicare Other | Admitting: Obstetrics and Gynecology

## 2021-12-13 ENCOUNTER — Encounter: Payer: Self-pay | Admitting: Obstetrics and Gynecology

## 2021-12-13 VITALS — BP 110/70 | Ht 62.0 in | Wt 194.0 lb

## 2021-12-13 DIAGNOSIS — Z1211 Encounter for screening for malignant neoplasm of colon: Secondary | ICD-10-CM

## 2021-12-13 DIAGNOSIS — Z01419 Encounter for gynecological examination (general) (routine) without abnormal findings: Secondary | ICD-10-CM | POA: Diagnosis not present

## 2021-12-13 DIAGNOSIS — E2839 Other primary ovarian failure: Secondary | ICD-10-CM

## 2021-12-13 DIAGNOSIS — Z1231 Encounter for screening mammogram for malignant neoplasm of breast: Secondary | ICD-10-CM

## 2021-12-13 DIAGNOSIS — M85859 Other specified disorders of bone density and structure, unspecified thigh: Secondary | ICD-10-CM

## 2021-12-13 NOTE — Patient Instructions (Addendum)
I value your feedback and you entrusting us with your care. If you get a [facility] patient survey, I would appreciate you taking the time to let us know about your experience today. Thank you!  [facility]: [phone]      

## 2021-12-15 ENCOUNTER — Telehealth: Payer: Self-pay

## 2021-12-15 NOTE — Telephone Encounter (Signed)
CALLED PATIENT NO ANSWER LEFT VOICEMAIL FOR A CALL BACK °Letter sent °

## 2021-12-21 ENCOUNTER — Telehealth: Payer: Medicare Other | Admitting: Psychiatry
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
## 2021-12-31 ENCOUNTER — Encounter: Payer: Self-pay | Admitting: Psychiatry

## 2021-12-31 ENCOUNTER — Telehealth (INDEPENDENT_AMBULATORY_CARE_PROVIDER_SITE_OTHER): Payer: Medicare Other | Admitting: Psychiatry

## 2021-12-31 DIAGNOSIS — F3342 Major depressive disorder, recurrent, in full remission: Secondary | ICD-10-CM

## 2021-12-31 DIAGNOSIS — R5383 Other fatigue: Secondary | ICD-10-CM

## 2021-12-31 DIAGNOSIS — F4310 Post-traumatic stress disorder, unspecified: Secondary | ICD-10-CM | POA: Diagnosis not present

## 2022-01-20 ENCOUNTER — Telehealth: Payer: Self-pay

## 2022-01-20 ENCOUNTER — Other Ambulatory Visit: Payer: Self-pay | Admitting: Psychiatry

## 2022-01-20 DIAGNOSIS — F411 Generalized anxiety disorder: Secondary | ICD-10-CM

## 2022-01-20 DIAGNOSIS — F3342 Major depressive disorder, recurrent, in full remission: Secondary | ICD-10-CM

## 2022-01-20 MED ORDER — VORTIOXETINE HBR 20 MG PO TABS: 20.0000 mg | ORAL_TABLET | Freq: Every day | ORAL | 1 refills | Status: DC

## 2022-01-20 NOTE — Telephone Encounter (Signed)
[facility] called states for the pharmacy it needed to go to [facility],  [place] < [place]  [id number].   pharmacy changed in the systemt.

## 2022-01-20 NOTE — Telephone Encounter (Signed)
pt called left a message that she needs her trintellix sent to the [facility].

## 2022-01-20 NOTE — Telephone Encounter (Signed)
Ordered

## 2022-01-20 NOTE — Telephone Encounter (Signed)
Ordered. Could you cancel the previous order? thanks

## 2022-01-21 NOTE — Telephone Encounter (Signed)
Called pharmacy and canceled previous order.

## 2022-03-18 ENCOUNTER — Telehealth: Payer: Self-pay

## 2022-03-18 NOTE — Telephone Encounter (Signed)
Called and left message to inform flu shots are available

## 2022-04-07 ENCOUNTER — Ambulatory Visit: Payer: Medicare Other | Admitting: Family Medicine

## 2022-04-08 ENCOUNTER — Ambulatory Visit
Admission: RE | Admit: 2022-04-08 | Discharge: 2022-04-08 | Disposition: A | Discharge status: Home / Self Care | Payer: Medicare Other | Source: Ambulatory Visit | Attending: Family Medicine | Admitting: Family Medicine

## 2022-04-08 ENCOUNTER — Encounter: Payer: Self-pay | Admitting: Family Medicine

## 2022-04-08 ENCOUNTER — Ambulatory Visit
Admission: RE | Admit: 2022-04-08 | Discharge: 2022-04-08 | Disposition: A | Discharge status: Home / Self Care | Payer: Medicare Other | Attending: Family Medicine | Admitting: Family Medicine

## 2022-04-08 ENCOUNTER — Ambulatory Visit (INDEPENDENT_AMBULATORY_CARE_PROVIDER_SITE_OTHER): Payer: Medicare Other | Admitting: Family Medicine

## 2022-04-08 VITALS — BP 126/80 | HR 80 | Ht 63.0 in | Wt 185.0 lb

## 2022-04-08 DIAGNOSIS — M79675 Pain in left toe(s): Secondary | ICD-10-CM | POA: Insufficient documentation

## 2022-04-08 DIAGNOSIS — S97112A Crushing injury of left great toe, initial encounter: Secondary | ICD-10-CM | POA: Diagnosis not present

## 2022-04-08 DIAGNOSIS — I10 Essential (primary) hypertension: Secondary | ICD-10-CM

## 2022-04-08 DIAGNOSIS — Z23 Encounter for immunization: Secondary | ICD-10-CM

## 2022-04-08 DIAGNOSIS — Z1211 Encounter for screening for malignant neoplasm of colon: Secondary | ICD-10-CM | POA: Diagnosis not present

## 2022-04-08 DIAGNOSIS — R5383 Other fatigue: Secondary | ICD-10-CM | POA: Diagnosis not present

## 2022-04-08 DIAGNOSIS — E782 Mixed hyperlipidemia: Secondary | ICD-10-CM

## 2022-04-08 MED ORDER — LOSARTAN POTASSIUM-HCTZ 100-25 MG PO TABS: 1.0000 | ORAL_TABLET | Freq: Every day | ORAL | 1 refills | Status: DC

## 2022-04-08 NOTE — Progress Notes (Signed)
Date:  04/08/2022   Name:  Barbara Durham   DOB:  23-Dec-1960   MRN:  [id number]   Chief Complaint: Hypertension, Flu Vaccine, and Colon Cancer Screening  Hypertension This is a chronic problem. The current episode started more than 1 year ago. The problem has been gradually improving since onset. The problem is controlled. Pertinent negatives include no anxiety, blurred vision, chest pain, headaches, malaise/fatigue, neck pain, orthopnea, palpitations, peripheral edema, PND, shortness of breath or sweats. There are no associated agents to hypertension. Risk factors for coronary artery disease include dyslipidemia. Past treatments include angiotensin blockers and diuretics. The current treatment provides moderate improvement. There are no compliance problems.  There is no history of angina, kidney disease, CAD/MI, CVA, heart failure, left ventricular hypertrophy, PVD or retinopathy. There is no history of chronic renal disease, a hypertension causing med or renovascular disease.  Foot Injury  The incident occurred more than 1 week ago (4 months ago). The incident occurred at home. The injury mechanism was a direct blow (dropped hammar). The pain is present in the left toes (left great toe). The pain is at a severity of 5/10. The pain is moderate. The pain has been Fluctuating since onset. Associated symptoms include an inability to bear weight. The symptoms are aggravated by weight bearing, movement and palpation. She has tried NSAIDs and acetaminophen for the symptoms. The treatment provided no relief.    Lab Results  Component Value Date   NA 144 10/05/2021   K 4.1 10/05/2021   CO2 24 10/05/2021   GLUCOSE 94 10/05/2021   BUN 13 10/05/2021   CREATININE 0.95 10/05/2021   CALCIUM 9.7 10/05/2021   EGFR 68 10/05/2021   GFRNONAA 58 (L) 04/07/2020   Lab Results  Component Value Date   CHOL 225 (H) 10/05/2021   HDL 53 10/05/2021   LDLCALC 138 (H) 10/05/2021   TRIG 193 (H) 10/05/2021    CHOLHDL 4.2 05/15/2017   No results found for: "TSH" No results found for: "HGBA1C" Lab Results  Component Value Date   WBC 10.6 10/29/2014   HGB 13.5 10/29/2014   HCT 39.7 10/29/2014   MCV 103 (H) 10/29/2014   PLT 187 10/29/2014   Lab Results  Component Value Date   ALT 14 10/29/2014   AST 19 10/29/2014   ALKPHOS 62 10/29/2014   BILITOT 0.9 10/29/2014   No results found for: "25OHVITD2", "25OHVITD3", "VD25OH"   Review of Systems  Constitutional:  Negative for chills, fever, malaise/fatigue and unexpected weight change.  HENT:  Negative for drooling, ear discharge, ear pain, sore throat and trouble swallowing.   Eyes:  Negative for blurred vision.  Respiratory:  Negative for cough, shortness of breath and wheezing.   Cardiovascular:  Negative for chest pain, palpitations, orthopnea, leg swelling and PND.  Gastrointestinal:  Negative for abdominal pain, blood in stool, constipation, diarrhea and nausea.  Endocrine: Negative for polydipsia and polyuria.  Genitourinary:  Negative for dysuria, frequency, hematuria and urgency.  Musculoskeletal:  Negative for back pain, myalgias and neck pain.  Skin:  Negative for rash.  Allergic/Immunologic: Negative for environmental allergies.  Neurological:  Negative for dizziness and headaches.  Hematological:  Does not bruise/bleed easily.  Psychiatric/Behavioral:  Negative for suicidal ideas. The patient is not nervous/anxious.     Patient Active Problem List   Diagnosis Date Noted   History of total hysterectomy 11/02/2021   Ganglion cyst of volar aspect of right wrist 10/20/2020   Primary osteoarthritis of first carpometacarpal joint  of right hand 10/20/2020   Primary insomnia 03/19/2020   Hypertension 01/21/2015   Back ache 06/14/2013   Lumbar transverse process fracture ([place]) 06/14/2013    No Known Allergies  Past Surgical History:  Procedure Laterality Date   arm fracture Left    COLONOSCOPY  2012   normal   CYSTOSCOPY   11/2011   DENTAL SURGERY     FOOT SURGERY Bilateral    LAPAROSCOPY  04/10/1997   LSO  1998   with LOA   OOPHORECTOMY     ORIF ORBITAL FRACTURE Left 01/16/2018   Procedure: OPEN REDUCTION INTERNAL FIXATION ORBITAL BLOW OUT FRACTURE;  Surgeon: Clyde Canterbury, MD;  Location: [facility];  Service: ENT;  Laterality: Left;  LEIBINGER SET    [place]   with [place]    Social History   Tobacco Use   Smoking status: Never   Smokeless tobacco: Never  Vaping Use   Vaping Use: Never used  Substance Use Topics   Alcohol use: Not Currently    Alcohol/week: 0.0 - 1.0 standard drinks of alcohol   Drug use: No     Medication list has been reviewed and updated.  Current Meds  Medication Sig   albuterol (VENTOLIN HFA) 108 (90 Base) MCG/ACT inhaler Inhale 2 puffs into the lungs every 6 (six) hours as needed for wheezing or shortness of breath.   Aspirin-Caffeine (BC FAST PAIN RELIEF ARTHRITIS PO) Take 1 packet by mouth in the morning and at bedtime.   Diclofenac Sodium 2 % SOLN Place 1 spray onto the skin 2 (two) times daily as needed.   losartan-hydrochlorothiazide (HYZAAR) 100-25 MG tablet Take 1 tablet by mouth daily.   vortioxetine HBr (TRINTELLIX) 20 MG TABS tablet Take 1 tablet (20 mg total) by mouth daily.       04/08/2022    3:35 PM 11/02/2021   10:50 AM 10/26/2021    3:52 PM 10/05/2021   10:12 AM  GAD 7 : Generalized Anxiety Score  Nervous, Anxious, on Edge 0 0 0 0  Control/stop worrying 0 0 0 0  Worry too much - different things 0 0 0 0  Trouble relaxing 0 0 0 0  Restless 0 0 0 0  Easily annoyed or irritable 0 0 0 0  Afraid - awful might happen 0 0 0 0  Total GAD 7 Score 0 0 0 0  Anxiety Difficulty Not difficult at all Not difficult at all Not difficult at all Not difficult at all       04/08/2022    3:35 PM 11/02/2021   10:50 AM 10/26/2021    3:52 PM  Depression screen PHQ 2/9  Decreased Interest 0 0 0   Down, Depressed, Hopeless 0 0 0  PHQ - 2 Score 0 0 0  Altered sleeping 0 0 0  Tired, decreased energy 0 0 0  Change in appetite 0 0 0  Feeling bad or failure about yourself  0 0 0  Trouble concentrating 0 0 0  Moving slowly or fidgety/restless 0 0 0  Suicidal thoughts 0 0 0  PHQ-9 Score 0 0 0  Difficult doing work/chores Not difficult at all Not difficult at all Not difficult at all    BP Readings from Last 3 Encounters:  04/08/22 126/80  12/13/21 110/70  11/02/21 122/80    Physical Exam Vitals and nursing note reviewed. Exam conducted with a chaperone present.  Constitutional:  General: She is not in acute distress.    Appearance: She is not diaphoretic.  HENT:     Head: Normocephalic and atraumatic.     Right Ear: Tympanic membrane and external ear normal.     Left Ear: Tympanic membrane and external ear normal.  Eyes:     Conjunctiva/sclera: Conjunctivae normal.     Pupils: Pupils are equal, round, and reactive to light.  Neck:     Thyroid: No thyromegaly.     Vascular: No JVD.  Cardiovascular:     Rate and Rhythm: Normal rate and regular rhythm.     Heart sounds: Normal heart sounds. No murmur heard.    No friction rub. No gallop.  Pulmonary:     Effort: Pulmonary effort is normal.     Breath sounds: Normal breath sounds. No wheezing, rhonchi or rales.  Abdominal:     Palpations: Abdomen is soft.  Musculoskeletal:     Cervical back: Normal range of motion and neck supple.     Left foot: Tenderness and bony tenderness present.       Feet:     Comments: DIP tender left great toe  Lymphadenopathy:     Cervical: No cervical adenopathy.  Skin:    General: Skin is warm and dry.  Neurological:     Mental Status: She is alert.     Wt Readings from Last 3 Encounters:  04/08/22 185 lb (83.9 kg)  12/13/21 194 lb (88 kg)  11/02/21 191 lb (86.6 kg)    BP 126/80   Pulse 80   Ht 5' 3" (1.6 m)   Wt 185 lb (83.9 kg)   BMI 32.77 kg/m   Assessment and  Plan: 1. Essential hypertension Chronic.  Controlled.  Stable.  Blood pressure today is 126/80.  Continue losartan hydrochlorothiazide 100-25 mg once a day.  Will check CMP. - losartan-hydrochlorothiazide (HYZAAR) 100-25 MG tablet; Take 1 tablet by mouth daily.  Dispense: 90 tablet; Refill: 1 - Comprehensive Metabolic Panel (CMET)  2. Toe pain, left Chronic.  Controlled.  Stable.  Patient had a crush injury to her left great toe when she dropped a hammer on it.  Patient continues to have pain particularly in the DIP.  We will check an x-ray to see if there is a residual fracture. - DG Toe Great Left; Future  3. Mixed hyperlipidemia Chronic.  Controlled.  Diet controlled and will continue with diet and we will check lipid panel for current level of control with diet. - Lipid Panel With LDL/HDL Ratio  4. Colon cancer screening Discussed and referral made. - Ambulatory referral to Gastroenterology  5. Need for immunization against influenza Discussed and administered. - Flu Vaccine QUAD 11moIM (Fluarix, Fluzone & Alfiuria Quad PF)     DOtilio Miu MD

## 2022-04-09 ENCOUNTER — Encounter: Payer: Self-pay | Admitting: Psychiatry

## 2022-04-09 LAB — COMPREHENSIVE METABOLIC PANEL
ALT: 13 IU/L (ref 0–32)
AST: 18 IU/L (ref 0–40)
Albumin/Globulin Ratio: 1.6 (ref 1.2–2.2)
Albumin: 4.6 g/dL (ref 3.9–4.9)
Alkaline Phosphatase: 90 IU/L (ref 44–121)
BUN/Creatinine Ratio: 14 (ref 12–28)
BUN: 15 mg/dL (ref 8–27)
Bilirubin Total: 0.4 mg/dL (ref 0.0–1.2)
CO2: 26 mmol/L (ref 20–29)
Calcium: 9.7 mg/dL (ref 8.7–10.3)
Chloride: 101 mmol/L (ref 96–106)
Creatinine, Ser: 1.09 mg/dL — ABNORMAL HIGH (ref 0.57–1.00)
Globulin, Total: 2.8 g/dL (ref 1.5–4.5)
Glucose: 93 mg/dL (ref 70–99)
Potassium: 3.8 mmol/L (ref 3.5–5.2)
Sodium: 142 mmol/L (ref 134–144)
Total Protein: 7.4 g/dL (ref 6.0–8.5)
eGFR: 57 mL/min/{1.73_m2} — ABNORMAL LOW (ref >59)

## 2022-04-09 LAB — TSH: TSH: 1.17 u[IU]/mL (ref 0.450–4.500)

## 2022-04-09 LAB — LIPID PANEL WITH LDL/HDL RATIO
Cholesterol, Total: 221 mg/dL — ABNORMAL HIGH (ref 100–199)
HDL: 52 mg/dL (ref >39)
LDL Chol Calc (NIH): 138 mg/dL — ABNORMAL HIGH (ref 0–99)
LDL/HDL Ratio: 2.7 ratio (ref 0.0–3.2)
Triglycerides: 173 mg/dL — ABNORMAL HIGH (ref 0–149)
VLDL Cholesterol Cal: 31 mg/dL (ref 5–40)

## 2022-04-11 ENCOUNTER — Encounter: Payer: Self-pay | Admitting: Family Medicine

## 2022-04-12 ENCOUNTER — Telehealth: Payer: Self-pay | Admitting: Psychiatry

## 2022-04-12 NOTE — Telephone Encounter (Signed)
TSH reviewed-dated 04/08/2022-1.170-normal.

## 2022-04-13 NOTE — Telephone Encounter (Signed)
left message that her labwork was withing normal limits

## 2022-04-25 ENCOUNTER — Other Ambulatory Visit: Payer: Self-pay | Admitting: Psychiatry

## 2022-04-25 DIAGNOSIS — F3342 Major depressive disorder, recurrent, in full remission: Secondary | ICD-10-CM

## 2022-04-25 DIAGNOSIS — F411 Generalized anxiety disorder: Secondary | ICD-10-CM

## 2022-05-18 ENCOUNTER — Ambulatory Visit: Payer: Medicare Other
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
## 2022-05-27 ENCOUNTER — Telehealth (INDEPENDENT_AMBULATORY_CARE_PROVIDER_SITE_OTHER): Payer: Medicare Other | Admitting: Psychiatry

## 2022-05-27 ENCOUNTER — Ambulatory Visit (INDEPENDENT_AMBULATORY_CARE_PROVIDER_SITE_OTHER): Payer: Medicare Other

## 2022-05-27 ENCOUNTER — Encounter: Payer: Self-pay | Admitting: Psychiatry

## 2022-05-27 DIAGNOSIS — Z0000 Encounter for general adult medical examination without abnormal findings: Secondary | ICD-10-CM | POA: Diagnosis not present

## 2022-05-27 DIAGNOSIS — F4310 Post-traumatic stress disorder, unspecified: Secondary | ICD-10-CM | POA: Diagnosis not present

## 2022-05-27 DIAGNOSIS — F3342 Major depressive disorder, recurrent, in full remission: Secondary | ICD-10-CM | POA: Diagnosis not present

## 2022-05-27 MED ORDER — VORTIOXETINE HBR 20 MG PO TABS: 20.0000 mg | ORAL_TABLET | Freq: Every day | ORAL | 1 refills | Status: DC

## 2022-05-27 NOTE — Progress Notes (Signed)
I connected with  Barbara Durham on 05/27/22 by a audio enabled telemedicine application and verified that I am speaking with the correct person using two identifiers.  Patient Location: Home  Provider Location: Office/Clinic  I discussed the limitations of evaluation and management by telemedicine. The patient expressed understanding and agreed to proceed.  Subjective:   Barbara Durham is a 61 y.o. female who presents for Medicare Annual (Subsequent) preventive examination.  Review of Systems    Per HPI unless specifically indicated below.        Objective:    There were no vitals filed for this visit. There is no height or weight on file to calculate BMI.     05/17/2021    2:46 PM 05/13/2020    3:01 PM 08/06/2018   11:19 AM 01/16/2018    6:50 AM 12/12/2016    2:07 PM 10/12/2016    1:13 PM 07/13/2016   12:11 PM  Advanced Directives  Does Patient Have a Medical Advance Directive? No No  No     Would patient like information on creating a medical advance directive? Yes (MAU/Ambulatory/Procedural Areas - Information given) Yes (MAU/Ambulatory/Procedural Areas - Information given)  No - Patient declined        Information is confidential and restricted. Go to Review Flowsheets to unlock data.    Current Medications (verified) Outpatient Encounter Medications as of 05/27/2022  Medication Sig   albuterol (VENTOLIN HFA) 108 (90 Base) MCG/ACT inhaler Inhale 2 puffs into the lungs every 6 (six) hours as needed for wheezing or shortness of breath.   Aspirin-Caffeine (BC FAST PAIN RELIEF ARTHRITIS PO) Take 1 packet by mouth in the morning and at bedtime.   Diclofenac Sodium 2 % SOLN Place 1 spray onto the skin 2 (two) times daily as needed.   losartan-hydrochlorothiazide (HYZAAR) 100-25 MG tablet Take 1 tablet by mouth daily.   vortioxetine HBr (TRINTELLIX) 20 MG TABS tablet Take 1 tablet (20 mg total) by mouth daily.   No facility-administered encounter medications on  file as of 05/27/2022.    Allergies (verified) Patient has no known allergies.   History: Past Medical History:  Diagnosis Date   Anxiety    Depression    Endometriosis    GERD (gastroesophageal reflux disease)    Hemorrhoids    Hypertension    Nocturia    enuresis   Osteopenia 10/2009, 6/20   in spine and hip   PONV (postoperative nausea and vomiting)    Stress incontinence, female    Past Surgical History:  Procedure Laterality Date   arm fracture Left    COLONOSCOPY  2012   normal   CYSTOSCOPY  11/2011   DENTAL SURGERY     FOOT SURGERY Bilateral    LAPAROSCOPY  04/10/1997   LSO  1998   with LOA   OOPHORECTOMY     ORIF ORBITAL FRACTURE Left 01/16/2018   Procedure: OPEN REDUCTION INTERNAL FIXATION ORBITAL BLOW OUT FRACTURE;  Surgeon: Clyde Canterbury, MD;  Location: [facility];  Service: ENT;  Laterality: Left;  LEIBINGER SET    [place]   with RSO   TUBAL LIGATION  1989   Family History  Problem Relation Age of Onset   Heart disease Mother    Hypertension Mother    Dementia Mother    Depression Mother    Diabetes Mother        type 2   Alzheimer's disease Father  Stroke Father    CVA Father    Prostate cancer Father    Heart disease Sister    Osteoporosis Sister    Arthritis Brother    Depression Brother    Depression Brother    Gout Brother    Hypertension Brother    Breast cancer Paternal Aunt        39s   Social History   Socioeconomic History   Marital status: Divorced    Spouse name: Not on file   Number of children: 1   Years of education: Not on file   Highest education level: Not on file  Occupational History   Occupation: Disability  Tobacco Use   Smoking status: Never   Smokeless tobacco: Never  Vaping Use   Vaping Use: Never used  Substance and Sexual Activity   Alcohol use: Not Currently    Alcohol/week: 0.0 - 1.0 standard drinks of alcohol   Drug use: No   Sexual activity: Not  Currently    Partners: Male    Birth control/protection: None  Other Topics Concern   Not on file  Social History Narrative   Pt lives alone   Social Determinants of Health   Financial Resource Strain: Low Risk  (05/27/2022)   Overall Financial Resource Strain (CARDIA)    Difficulty of Paying Living Expenses: Not hard at all  Food Insecurity: No Food Insecurity (05/27/2022)   Hunger Vital Sign    Worried About Running Out of Food in the Last Year: Never true    [place] in the Last Year: Never true  Transportation Needs: No Transportation Needs (05/27/2022)   PRAPARE - Hydrologist (Medical): No    Lack of Transportation (Non-Medical): No  Physical Activity: Sufficiently Active (05/27/2022)   Exercise Vital Sign    Days of Exercise per Week: 3 days    Minutes of Exercise per Session: 60 min  Stress: No Stress Concern Present (05/27/2022)   [place]    Feeling of Stress : Not at all  Social Connections: Moderately Isolated (05/27/2022)   Social Connection and Isolation Panel [NHANES]    Frequency of Communication with Friends and Family: More than three times a week    Frequency of Social Gatherings with Friends and Family: More than three times a week    Attends Religious Services: Never    Marine scientist or Organizations: Yes    Attends Music therapist: More than 4 times per year    Marital Status: Divorced    Tobacco Counseling Counseling given: No   Clinical Intake:  Pre-visit preparation completed: No  Pain : No/denies pain     Nutritional Status: BMI 25 -29 Overweight Nutritional Risks: None Diabetes: No  How often do you need to have someone help you when you read instructions, pamphlets, or other written materials from your doctor or pharmacy?: 1 - Never  Diabetic?no  Interpreter Needed?: No  Information entered by :: Donnie [place]a,  CMA   Activities of Daily Living    05/27/2022    3:31 PM 10/26/2021    3:52 PM  In your present state of health, do you have any difficulty performing the following activities:  Hearing? 0 0  Vision? 0 0  Difficulty concentrating or making decisions? 0 0  Walking or climbing stairs? 0 0  Dressing or bathing? 0 0  Doing errands, shopping? 0 0    Patient  Care Team: Juline Patch, MD as PCP - General (Family Medicine) Norman [facility], MD as Consulting Physician (Psychiatry) [name], MD as Consulting Physician (Sports Medicine)  Indicate any recent Medical Services you may have received from other than [facility] providers in the past year (date may be approximate).    No hospitalization in the past 12 months. Assessment:   This is a routine wellness examination for [place].  Hearing/Vision screen Denies any hearing issues. Denies any vision issue. Overdue for her Annual Eye Exam.  Dietary issues and exercise activities discussed: Current Exercise Habits: Home exercise routine, Type of exercise: Other - see comments (gardening, yardwork), Time (Minutes): 60, Frequency (Times/Week): 3, Weekly Exercise (Minutes/Week): 180, Intensity: Mild   Goals Addressed             This Visit's Progress    Stay Active and Independent       Why is this important?   Regular activity or exercise is important to managing back pain.  Activity helps to keep your muscles strong.  You will sleep better and feel more relaxed.  You will have more energy and feel less stressed.  If you are not active now, start slowly. Little changes make a big difference.  Rest, but not too much.  Stay as active as you can and listen to your body's signals.            Depression Screen    05/27/2022    3:30 PM 04/08/2022    3:35 PM 11/02/2021   10:50 AM 10/26/2021    3:52 PM 10/05/2021   10:11 AM 06/21/2021    9:22 AM 05/25/2021   11:38 AM  PHQ 2/9 Scores  PHQ - 2 Score 0 0 0 0 0  0  PHQ- 9 Score  0  0 0 0  0     Information is confidential and restricted. Go to Review Flowsheets to unlock data.    Fall Risk    05/27/2022    3:30 PM 04/08/2022    3:35 PM 10/26/2021    3:52 PM 05/25/2021   11:37 AM 05/17/2021    2:47 PM  [place] in the past year? 0 0 0 0 0  Number falls in past yr: 0 0 0 0 0  Injury with Fall? 0 0 0 0 0  Risk for fall due to : No Fall Risks No Fall Risks No Fall Risks Orthopedic patient No Fall Risks  Follow up Falls evaluation completed Falls evaluation completed Falls evaluation completed Falls evaluation completed Falls prevention discussed    [place]:  Any stairs in or around the home? Yes  If so, are there any without handrails? Yes  Home free of loose throw rugs in walkways, pet beds, electrical cords, etc? Yes  Adequate lighting in your home to reduce risk of falls? Yes   ASSISTIVE DEVICES UTILIZED TO PREVENT FALLS:  Life alert? No  Use of a cane, walker or w/c? No  Grab bars in the bathroom? Yes  Shower chair or bench in shower? No  Elevated toilet seat or a handicapped toilet? No   TIMED UP AND GO:  Was the test performed? No .  Cognitive Function:        05/27/2022    3:32 PM  6CIT Screen  What Year? 0 points  What month? 0 points  What time? 0 points  Count back from 20 0 points  Months in reverse  0 points  Repeat phrase 0 points  Total Score 0 points    Immunizations Immunization History  Administered Date(s) Administered   Influenza,inj,Quad PF,6+ Mos 07/06/2016, 05/15/2017, 09/05/2018, 09/23/2019, 06/04/2020, 04/07/2021, 04/08/2022   PFIZER(Purple Top)SARS-COV-2 Vaccination 04/06/2020, 04/27/2020   Tdap 05/19/2013, 07/06/2016    TDAP status: Up to date  Flu Vaccine status: Up to date  Pneumococcal vaccine status: Up to date  Covid-19 vaccine status: Information provided on how to obtain vaccines.   Qualifies for Shingles Vaccine? Yes   Zostavax completed No   Shingrix  Completed?: No.    Education has been provided regarding the importance of this vaccine. Patient has been advised to call insurance company to determine out of pocket expense if they have not yet received this vaccine. Advised may also receive vaccine at local pharmacy or Health Dept. Verbalized acceptance and understanding.  Screening Tests Health Maintenance  Topic Date Due   COLONOSCOPY (Pts 45-28yr Insurance coverage will need to be confirmed)  07/25/2020   Zoster Vaccines- Shingrix (1 of 2) 07/08/2022 (Originally 04/06/2010)   Medicare Annual Wellness (AWV)  05/28/2023   MAMMOGRAM  06/10/2023   PAP SMEAR-Modifier  08/21/2023   TETANUS/TDAP  07/06/2026   INFLUENZA VACCINE  Completed   HPV VACCINES  Aged Out   COVID-19 Vaccine  Discontinued   Hepatitis C Screening  Discontinued   HIV Screening  Discontinued    Health Maintenance  Health Maintenance Due  Topic Date Due   COLONOSCOPY (Pts 45-473yrInsurance coverage will need to be confirmed)  07/25/2020    Colorectal cancer screening: Referral to GI placed 04/08/2022. Pt aware the office will call re: appt.[facility] w/ Dr. WoAllen Norris Mammogram status: Completed 06/09/2021. Repeat every year    Lung Cancer Screening: (Low Dose CT Chest recommended if Age 62-80ears, 30 pack-year currently smoking OR have quit w/in 15years.) does not qualify.    Additional Screening:  Hepatitis C Screening: does qualify  Vision Screening: Recommended annual ophthalmology exams for early detection of glaucoma and other disorders of the eye. Is the patient up to date with their annual eye exam?  No  Who is the provider or what is the name of the office in which the patient attends annual eye exams?  If pt is not established with a provider, would they like to be referred to a provider to establish care? No .   Dental Screening: Recommended annual dental exams for proper oral hygiene  Community Resource Referral / Chronic Care  Management: CRR required this visit?  No   CCM required this visit?  No      Plan:     I have personally reviewed and noted the following in the patient's chart:   Medical and social history Use of alcohol, tobacco or illicit drugs  Current medications and supplements including opioid prescriptions. Patient is not currently taking opioid prescriptions. Functional ability and status Nutritional status Physical activity Advanced directives List of other physicians Hospitalizations, surgeries, and ER visits in previous 12 months Vitals Screenings to include cognitive, depression, and falls Referrals and appointments  In addition, I have reviewed and discussed with patient certain preventive protocols, quality metrics, and best practice recommendations. A written personalized care plan for preventive services as well as general preventive health recommendations were provided to patient.    Ms. PaSouthgate Thank you for taking time to come for your Medicare Wellness Visit. I appreciate your ongoing commitment to your health goals. Please review the following plan we discussed  and let me know if I can assist you in the future.   These are the goals we discussed:  Goals      Stay Active and Independent     Why is this important?   Regular activity or exercise is important to managing back pain.  Activity helps to keep your muscles strong.  You will sleep better and feel more relaxed.  You will have more energy and feel less stressed.  If you are not active now, start slowly. Little changes make a big difference.  Rest, but not too much.  Stay as active as you can and listen to your body's signals.          Weight (lb) < 180 lb (81.6 kg)     Pt states she would like to lose weight with healthy eating and increasing physical activity         This is a list of the screening recommended for you and due dates:  Health Maintenance  Topic Date Due   Colon Cancer Screening   07/25/2020   Zoster (Shingles) Vaccine (1 of 2) 07/08/2022*   Medicare Annual Wellness Visit  05/28/2023   Mammogram  06/10/2023   Pap Smear  08/21/2023   Tetanus Vaccine  07/06/2026   Flu Shot  Completed   HPV Vaccine  Aged Out   COVID-19 Vaccine  Discontinued   Hepatitis C Screening: USPSTF Recommendation to screen - Ages 18-79 yo.  Discontinued   HIV Screening  Discontinued  *Topic was postponed. The date shown is not the original due date.     Wilson Singer, CMA   05/27/2022   Nurse Notes: Approximately 30 minute non-Face-To-Face visit

## 2022-05-27 NOTE — Patient Instructions (Signed)

## 2022-06-03 ENCOUNTER — Telehealth: Payer: Medicare Other | Admitting: Psychiatry

## 2022-06-14 ENCOUNTER — Ambulatory Visit
Admission: RE | Admit: 2022-06-14 | Discharge: 2022-06-14 | Disposition: A | Discharge status: Home / Self Care | Payer: Medicare Other | Source: Ambulatory Visit | Attending: Obstetrics and Gynecology | Admitting: Obstetrics and Gynecology

## 2022-06-14 DIAGNOSIS — M85859 Other specified disorders of bone density and structure, unspecified thigh: Secondary | ICD-10-CM

## 2022-06-14 DIAGNOSIS — Z1231 Encounter for screening mammogram for malignant neoplasm of breast: Secondary | ICD-10-CM | POA: Diagnosis not present

## 2022-06-14 DIAGNOSIS — E2839 Other primary ovarian failure: Secondary | ICD-10-CM | POA: Diagnosis not present

## 2022-06-14 DIAGNOSIS — M810 Age-related osteoporosis without current pathological fracture: Secondary | ICD-10-CM | POA: Diagnosis not present

## 2022-06-21 ENCOUNTER — Telehealth: Payer: Self-pay | Admitting: Obstetrics and Gynecology

## 2022-06-21 NOTE — Telephone Encounter (Signed)
Patient is calling due to missed call from [place]. Please advise?

## 2022-06-22 NOTE — Telephone Encounter (Signed)
Pls let pt know I was calling about bone density results. I sent her the info on the results through her MyChart. Pls let me know if she has any questions. Thx.

## 2022-06-23 NOTE — Telephone Encounter (Signed)
1200-1500 mg daily or 4-5 servings of milk, cheese, yogurt, ice cream, green leafy veggies, or combo of all of them.

## 2022-06-23 NOTE — Telephone Encounter (Signed)
Pt aware.

## 2022-07-01 ENCOUNTER — Encounter: Payer: Self-pay | Admitting: Family Medicine

## 2022-07-01 ENCOUNTER — Ambulatory Visit (INDEPENDENT_AMBULATORY_CARE_PROVIDER_SITE_OTHER): Payer: Medicare Other | Admitting: Family Medicine

## 2022-07-01 VITALS — BP 108/70 | HR 74 | Ht 63.0 in | Wt 188.0 lb

## 2022-07-01 DIAGNOSIS — J0100 Acute maxillary sinusitis, unspecified: Secondary | ICD-10-CM | POA: Diagnosis not present

## 2022-07-01 MED ORDER — AMOXICILLIN 500 MG PO CAPS: 500.0000 mg | ORAL_CAPSULE | Freq: Three times a day (TID) | ORAL | 0 refills | Status: AC

## 2022-07-01 NOTE — Progress Notes (Signed)
Date:  07/01/2022   Name:  Barbara [facility]   DOB:  04/05/1961   MRN:  [id number]   Chief Complaint: Sinusitis (Frontal headache with sore throat and nasal congestion- started yesterday)  Sinusitis This is a new problem. The current episode started yesterday. The problem has been gradually worsening since onset. There has been no fever. Associated symptoms include chills, congestion, coughing, headaches, sinus pressure and a sore throat. Pertinent negatives include no diaphoresis, ear pain, neck pain or shortness of breath. Treatments tried: ibuprofen.    Lab Results  Component Value Date   NA 142 04/08/2022   K 3.8 04/08/2022   CO2 26 04/08/2022   GLUCOSE 93 04/08/2022   BUN 15 04/08/2022   CREATININE 1.09 (H) 04/08/2022   CALCIUM 9.7 04/08/2022   EGFR 57 (L) 04/08/2022   GFRNONAA 58 (L) 04/07/2020   Lab Results  Component Value Date   CHOL 221 (H) 04/08/2022   HDL 52 04/08/2022   LDLCALC 138 (H) 04/08/2022   TRIG 173 (H) 04/08/2022   CHOLHDL 4.2 05/15/2017   Lab Results  Component Value Date   TSH 1.170 04/08/2022   No results found for: "HGBA1C" Lab Results  Component Value Date   WBC 10.6 10/29/2014   HGB 13.5 10/29/2014   HCT 39.7 10/29/2014   MCV 103 (H) 10/29/2014   PLT 187 10/29/2014   Lab Results  Component Value Date   ALT 13 04/08/2022   AST 18 04/08/2022   ALKPHOS 90 04/08/2022   BILITOT 0.4 04/08/2022   No results found for: "25OHVITD2", "25OHVITD3", "VD25OH"   Review of Systems  Constitutional:  Positive for chills. Negative for diaphoresis and fever.  HENT:  Positive for congestion, postnasal drip, rhinorrhea, sinus pressure and sore throat. Negative for drooling, ear discharge and ear pain.   Respiratory:  Positive for cough and chest tightness. Negative for shortness of breath and wheezing.   Cardiovascular:  Negative for chest pain, palpitations and leg swelling.  Gastrointestinal:  Negative for abdominal pain, blood in stool,  constipation, diarrhea and nausea.  Endocrine: Negative for polydipsia.  Genitourinary:  Negative for dysuria, frequency, hematuria and urgency.  Musculoskeletal:  Negative for back pain, myalgias and neck pain.  Skin:  Negative for rash.  Allergic/Immunologic: Negative for environmental allergies.  Neurological:  Positive for headaches. Negative for dizziness.  Hematological:  Does not bruise/bleed easily.  Psychiatric/Behavioral:  Negative for suicidal ideas. The patient is not nervous/anxious.     Patient Active Problem List   Diagnosis Date Noted   History of total hysterectomy 11/02/2021   Ganglion cyst of volar aspect of right wrist 10/20/2020   Primary osteoarthritis of first carpometacarpal joint of right hand 10/20/2020   Primary insomnia 03/19/2020   Hypertension 01/21/2015   Back ache 06/14/2013   Lumbar transverse process fracture ([place]) 06/14/2013    No Known Allergies  Past Surgical History:  Procedure Laterality Date   arm fracture Left    COLONOSCOPY  2012   normal   CYSTOSCOPY  11/2011   DENTAL SURGERY     FOOT SURGERY Bilateral    LAPAROSCOPY  04/10/1997   LSO  1998   with LOA   OOPHORECTOMY     ORIF ORBITAL FRACTURE Left 01/16/2018   Procedure: OPEN REDUCTION INTERNAL FIXATION ORBITAL BLOW OUT FRACTURE;  Surgeon: Clyde Canterbury, MD;  Location: [facility];  Service: ENT;  Laterality: Left;  [place] SET    [place]  with RSO   TUBAL LIGATION  1989    Social History   Tobacco Use   Smoking status: Never   Smokeless tobacco: Never  Vaping Use   Vaping Use: Never used  Substance Use Topics   Alcohol use: Not Currently    Alcohol/week: 0.0 - 1.0 standard drinks of alcohol   Drug use: No     Medication list has been reviewed and updated.  Current Meds  Medication Sig   albuterol (VENTOLIN HFA) 108 (90 Base) MCG/ACT inhaler Inhale 2 puffs into the lungs every 6 (six) hours as needed for wheezing or  shortness of breath.   Aspirin-Caffeine (BC FAST PAIN RELIEF ARTHRITIS PO) Take 1 packet by mouth in the morning and at bedtime.   Diclofenac Sodium 2 % SOLN Place 1 spray onto the skin 2 (two) times daily as needed.   losartan-hydrochlorothiazide (HYZAAR) 100-25 MG tablet Take 1 tablet by mouth daily.   vortioxetine HBr (TRINTELLIX) 20 MG TABS tablet Take 1 tablet (20 mg total) by mouth daily.       04/08/2022    3:35 PM 11/02/2021   10:50 AM 10/26/2021    3:52 PM 10/05/2021   10:12 AM  GAD 7 : Generalized Anxiety Score  Nervous, Anxious, on Edge 0 0 0 0  Control/stop worrying 0 0 0 0  Worry too much - different things 0 0 0 0  Trouble relaxing 0 0 0 0  Restless 0 0 0 0  Easily annoyed or irritable 0 0 0 0  Afraid - awful might happen 0 0 0 0  Total GAD 7 Score 0 0 0 0  Anxiety Difficulty Not difficult at all Not difficult at all Not difficult at all Not difficult at all       05/27/2022    3:30 PM 04/08/2022    3:35 PM 11/02/2021   10:50 AM  Depression screen PHQ 2/9  Decreased Interest 0 0 0  Down, Depressed, Hopeless 0 0 0  PHQ - 2 Score 0 0 0  Altered sleeping  0 0  Tired, decreased energy  0 0  Change in appetite  0 0  Feeling bad or failure about yourself   0 0  Trouble concentrating  0 0  Moving slowly or fidgety/restless  0 0  Suicidal thoughts  0 0  PHQ-9 Score  0 0  Difficult doing work/chores  Not difficult at all Not difficult at all    BP Readings from Last 3 Encounters:  07/01/22 108/70  04/08/22 126/80  12/13/21 110/70    Physical Exam Vitals and nursing note reviewed. Exam conducted with a chaperone present.  Constitutional:      General: She is not in acute distress.    Appearance: She is not diaphoretic.  HENT:     Head: Normocephalic and atraumatic.     Right Ear: Tympanic membrane and external ear normal.     Left Ear: Tympanic membrane and external ear normal.     Nose:     Right Turbinates: Swollen.     Left Turbinates: Swollen.     Right  Sinus: Maxillary sinus tenderness and frontal sinus tenderness present.     Left Sinus: Maxillary sinus tenderness and frontal sinus tenderness present.     Mouth/Throat:     Mouth: Mucous membranes are moist.     Pharynx: Oropharynx is clear. Uvula midline. No pharyngeal swelling or posterior oropharyngeal erythema.  Eyes:     General:  Right eye: No discharge.        Left eye: No discharge.     Conjunctiva/sclera: Conjunctivae normal.     Pupils: Pupils are equal, round, and reactive to light.  Neck:     Thyroid: No thyromegaly.     Vascular: No JVD.  Cardiovascular:     Rate and Rhythm: Normal rate and regular rhythm.     Heart sounds: Normal heart sounds. No murmur heard.    No friction rub. No gallop.  Pulmonary:     Effort: Pulmonary effort is normal.     Breath sounds: Normal breath sounds.  Abdominal:     General: Bowel sounds are normal.     Palpations: Abdomen is soft. There is no mass.     Tenderness: There is no abdominal tenderness. There is no guarding.  Musculoskeletal:        General: Normal range of motion.     Cervical back: Normal range of motion and neck supple.  Lymphadenopathy:     Cervical: No cervical adenopathy.  Skin:    General: Skin is warm and dry.  Neurological:     Mental Status: She is alert.     Deep Tendon Reflexes: Reflexes are normal and symmetric.     Wt Readings from Last 3 Encounters:  07/01/22 188 lb (85.3 kg)  04/08/22 185 lb (83.9 kg)  12/13/21 194 lb (88 kg)    BP 108/70   Pulse 74   Ht _0  (1.6 m)   Wt 188 lb (85.3 kg)   SpO2 96%   BMI 33.30 kg/m   Assessment and Plan:  1. Acute maxillary sinusitis, recurrence not specified Acute.  Persistent.  Stable.  Examination and history are consistent with maxillary sinusitis as well as early frontal sinusitis.  We will treat with amoxicillin 500 mg 3 times a day for 10 days and have suggested Mucinex DM as needed cough and mucolytic action. - amoxicillin (AMOXIL) 500  MG capsule; Take 1 capsule (500 mg total) by mouth 3 (three) times daily for 10 days.  Dispense: 30 capsule; Refill: 0    Otilio Miu, MD

## 2022-10-06 ENCOUNTER — Encounter: Payer: Self-pay | Admitting: Family Medicine

## 2022-10-06 ENCOUNTER — Ambulatory Visit (INDEPENDENT_AMBULATORY_CARE_PROVIDER_SITE_OTHER): Payer: Medicare Other | Admitting: Family Medicine

## 2022-10-06 ENCOUNTER — Ambulatory Visit
Admission: RE | Admit: 2022-10-06 | Discharge: 2022-10-06 | Disposition: A | Discharge status: Home / Self Care | Payer: Medicare Other | Source: Ambulatory Visit | Attending: Family Medicine | Admitting: Family Medicine

## 2022-10-06 ENCOUNTER — Ambulatory Visit
Admission: RE | Admit: 2022-10-06 | Discharge: 2022-10-06 | Disposition: A | Discharge status: Home / Self Care | Payer: Medicare Other | Attending: Family Medicine | Admitting: Family Medicine

## 2022-10-06 VITALS — BP 102/68 | HR 74 | Ht 63.0 in | Wt 190.0 lb

## 2022-10-06 DIAGNOSIS — R69 Illness, unspecified: Secondary | ICD-10-CM | POA: Diagnosis not present

## 2022-10-06 DIAGNOSIS — D7589 Other specified diseases of blood and blood-forming organs: Secondary | ICD-10-CM

## 2022-10-06 DIAGNOSIS — E8809 Other disorders of plasma-protein metabolism, not elsewhere classified: Secondary | ICD-10-CM

## 2022-10-06 DIAGNOSIS — I10 Essential (primary) hypertension: Secondary | ICD-10-CM

## 2022-10-06 DIAGNOSIS — E782 Mixed hyperlipidemia: Secondary | ICD-10-CM | POA: Diagnosis not present

## 2022-10-06 DIAGNOSIS — M5412 Radiculopathy, cervical region: Secondary | ICD-10-CM

## 2022-10-06 DIAGNOSIS — M542 Cervicalgia: Secondary | ICD-10-CM | POA: Diagnosis not present

## 2022-10-06 MED ORDER — LOSARTAN POTASSIUM-HCTZ 100-25 MG PO TABS: 1.0000 | ORAL_TABLET | Freq: Every day | ORAL | 1 refills | Status: DC

## 2022-10-06 NOTE — Progress Notes (Signed)
Date:  10/06/2022   Name:  Barbara Durham   DOB:  16-Aug-1960   MRN:  [id number]   Chief Complaint: Hypertension and Neck Pain (Radiating down into R) shoulder)  Patient is a 62 year old female who presents for a med refill exam. The patient reports the following problems: decreased protein. Health maintenance has been reviewed up to date.    Hypertension This is a chronic problem. Associated symptoms include neck pain. Pertinent negatives include no chest pain, headaches or shortness of breath. There is no history of chronic renal disease.  Neck Pain  This is a new problem. The current episode started more than 1 month ago. The problem occurs intermittently. The problem has been waxing and waning. The pain is associated with nothing. The pain is present in the right side (posterior trapezius). The pain is moderate. The symptoms are aggravated by position. Pertinent negatives include no chest pain, fever, headaches, leg pain, numbness, paresis, tingling, trouble swallowing or weakness. Treatments tried: topical.  Hyperlipidemia This is a chronic problem. The current episode started more than 1 year ago. The problem is controlled. Recent lipid tests were reviewed and are normal. She has no history of chronic renal disease, diabetes, hypothyroidism, liver disease, obesity or nephrotic syndrome. There are no known factors aggravating her hyperlipidemia. Pertinent negatives include no chest pain, focal sensory loss, focal weakness, leg pain, myalgias or shortness of breath. Current antihyperlipidemic treatment includes diet change. The current treatment provides no improvement of lipids. There are no compliance problems.     Lab Results  Component Value Date   NA 142 04/08/2022   K 3.8 04/08/2022   CO2 26 04/08/2022   GLUCOSE 93 04/08/2022   BUN 15 04/08/2022   CREATININE 1.09 (H) 04/08/2022   CALCIUM 9.7 04/08/2022   EGFR 57 (L) 04/08/2022   GFRNONAA 58 (L) 04/07/2020   Lab Results   Component Value Date   CHOL 221 (H) 04/08/2022   HDL 52 04/08/2022   LDLCALC 138 (H) 04/08/2022   TRIG 173 (H) 04/08/2022   CHOLHDL 4.2 05/15/2017   Lab Results  Component Value Date   TSH 1.170 04/08/2022   No results found for: "HGBA1C" Lab Results  Component Value Date   WBC 10.6 10/29/2014   HGB 13.5 10/29/2014   HCT 39.7 10/29/2014   MCV 103 (H) 10/29/2014   PLT 187 10/29/2014   Lab Results  Component Value Date   ALT 13 04/08/2022   AST 18 04/08/2022   ALKPHOS 90 04/08/2022   BILITOT 0.4 04/08/2022   No results found for: "25OHVITD2", "25OHVITD3", "VD25OH"   Review of Systems  Constitutional: Negative.  Negative for chills, fatigue, fever and unexpected weight change.  HENT:  Negative for congestion, ear discharge, ear pain, rhinorrhea, sinus pressure, sneezing, sore throat and trouble swallowing.   Eyes:  Negative for visual disturbance.  Respiratory:  Negative for cough, chest tightness, shortness of breath, wheezing and stridor.   Cardiovascular:  Negative for chest pain.  Gastrointestinal:  Negative for abdominal pain, blood in stool and nausea.  Endocrine: Negative for polydipsia and polyuria.  Genitourinary:  Negative for difficulty urinating, dysuria, flank pain, frequency, hematuria, menstrual problem, urgency and vaginal discharge.  Musculoskeletal:  Positive for neck pain. Negative for arthralgias, back pain and myalgias.  Skin:  Negative for rash.  Neurological:  Negative for dizziness, tingling, focal weakness, weakness, numbness and headaches.  Hematological:  Negative for adenopathy. Does not bruise/bleed easily.  Psychiatric/Behavioral:  Negative for dysphoric  mood. The patient is not nervous/anxious.     Patient Active Problem List   Diagnosis Date Noted   History of total hysterectomy 11/02/2021   Ganglion cyst of volar aspect of right wrist 10/20/2020   Primary osteoarthritis of first carpometacarpal joint of right hand 10/20/2020   Primary  insomnia 03/19/2020   Hypertension 01/21/2015   Back ache 06/14/2013   Lumbar transverse process fracture ([place]) 06/14/2013    No Known Allergies  Past Surgical History:  Procedure Laterality Date   arm fracture Left    COLONOSCOPY  2012   normal   CYSTOSCOPY  11/2011   DENTAL SURGERY     FOOT SURGERY Bilateral    LAPAROSCOPY  04/10/1997   LSO  1998   with LOA   OOPHORECTOMY     ORIF ORBITAL FRACTURE Left 01/16/2018   Procedure: OPEN REDUCTION INTERNAL FIXATION ORBITAL BLOW OUT FRACTURE;  Surgeon: Clyde Canterbury, MD;  Location: [place];  Service: ENT;  Laterality: Left;  LEIBINGER SET    [place]   with [place]    Social History   Tobacco Use   Smoking status: Never   Smokeless tobacco: Never  Vaping Use   Vaping Use: Never used  Substance Use Topics   Alcohol use: Not Currently    Alcohol/week: 0.0 - 1.0 standard drinks of alcohol   Drug use: No     Medication list has been reviewed and updated.  Current Meds  Medication Sig   albuterol (VENTOLIN HFA) 108 (90 Base) MCG/ACT inhaler Inhale 2 puffs into the lungs every 6 (six) hours as needed for wheezing or shortness of breath.   Aspirin-Caffeine (BC FAST PAIN RELIEF ARTHRITIS PO) Take 1 packet by mouth in the morning and at bedtime.   Diclofenac Sodium 2 % SOLN Place 1 spray onto the skin 2 (two) times daily as needed.   losartan-hydrochlorothiazide (HYZAAR) 100-25 MG tablet Take 1 tablet by mouth daily.       10/06/2022   10:14 AM 04/08/2022    3:35 PM 11/02/2021   10:50 AM 10/26/2021    3:52 PM  GAD 7 : Generalized Anxiety Score  Nervous, Anxious, on Edge 0 0 0 0  Control/stop worrying 0 0 0 0  Worry too much - different things 0 0 0 0  Trouble relaxing 0 0 0 0  Restless 0 0 0 0  Easily annoyed or irritable 0 0 0 0  Afraid - awful might happen 0 0 0 0  Total GAD 7 Score 0 0 0 0  Anxiety Difficulty Not difficult at all Not difficult at all  Not difficult at all Not difficult at all       10/06/2022   10:14 AM 05/27/2022    3:30 PM 04/08/2022    3:35 PM  Depression screen PHQ 2/9  Decreased Interest 0 0 0  Down, Depressed, Hopeless 0 0 0  PHQ - 2 Score 0 0 0  Altered sleeping 0  0  Tired, decreased energy 0  0  Change in appetite 0  0  Feeling bad or failure about yourself  0  0  Trouble concentrating 0  0  Moving slowly or fidgety/restless 0  0  Suicidal thoughts 0  0  PHQ-9 Score 0  0  Difficult doing work/chores Not difficult at all  Not difficult at all    BP Readings from Last 3 Encounters:  10/06/22 102/68  07/01/22 108/70  04/08/22 126/80    Physical Exam Vitals and nursing note reviewed. Exam conducted with a chaperone present.  Constitutional:      General: She is not in acute distress.    Appearance: She is not diaphoretic.  HENT:     Head: Normocephalic and atraumatic.     Right Ear: External ear normal.     Left Ear: External ear normal.     Nose: Nose normal.  Eyes:     General:        Right eye: No discharge.        Left eye: No discharge.     Conjunctiva/sclera: Conjunctivae normal.     Pupils: Pupils are equal, round, and reactive to light.  Neck:     Thyroid: No thyromegaly.     Vascular: No JVD.  Cardiovascular:     Rate and Rhythm: Normal rate and regular rhythm.     Heart sounds: Normal heart sounds. No murmur heard.    No friction rub. No gallop.  Pulmonary:     Effort: Pulmonary effort is normal.     Breath sounds: Normal breath sounds. No wheezing, rhonchi or rales.  Abdominal:     General: Bowel sounds are normal.     Palpations: Abdomen is soft. There is no mass.     Tenderness: There is no abdominal tenderness. There is no guarding.  Musculoskeletal:        General: Normal range of motion.     Cervical back: Normal, normal range of motion and neck supple. No deformity, spasms or tenderness. Normal range of motion.  Lymphadenopathy:     Cervical: No cervical adenopathy.   Skin:    General: Skin is warm and dry.  Neurological:     Mental Status: She is alert.     Wt Readings from Last 3 Encounters:  10/06/22 190 lb (86.2 kg)  07/01/22 188 lb (85.3 kg)  04/08/22 185 lb (83.9 kg)    BP 102/68   Pulse 74   Ht '5\' 3"'$  (1.6 m)   Wt 190 lb (86.2 kg)   SpO2 98%   BMI 33.66 kg/m   Assessment and Plan:  1. Essential hypertension Chronic.  Controlled.  Stable.  Blood pressure today is 102/68.  Asymptomatic.  Tolerating medications well.  Continue losartan hydrochlorothiazide 100-25 mg once a day. - losartan-hydrochlorothiazide (HYZAAR) 100-25 MG tablet; Take 1 tablet by mouth daily.  Dispense: 90 tablet; Refill: 1 - Comprehensive Metabolic Panel (CMET)  2. Cervical radiculopathy Chronic.  Controlled.  Stable.  No tenderness over the spinous or transverse processes.  Neurologic exam normal.  Pain is not located in the right posterior trapezius area with tenderness.  This is consistent with localized muscle concern.  Given the possibility of cervical radiculopathy we will obtain CT spine for evaluation. - DG Cervical Spine Complete  3. Mixed hyperlipidemia Chronic.  unControlled.  Stable.  Elevated LDL.  Prediscussion with patient is that he may need to do better with dietary approach and in the meantime we will recheck lipid panel.- Lipid Panel With LDL/HDL Ratio  4. Taking medication for chronic disease Given the procedures that patient has plasma removed twice a week there is risk for polycythemia and reduction of albumin.  We will check CBC as well as CMP with total protein and albumin. - CBC with Differential/Platelet  5. Decreased serum protein level As noted above patient had a decreased serum protein at 6.2 with our cutoff being in 6.0.  We  will recheck total protein and albumin. - Comprehensive Metabolic Panel (CMET)  6. Increased MCV With increased MCV but we will also check for hemoglobin for polycythemia doing to chronic plasma loss due to  donation on a twice a week basis - CBC with Differential/Platelet    Otilio Miu, MD

## 2022-10-07 ENCOUNTER — Other Ambulatory Visit: Payer: Self-pay

## 2022-10-07 DIAGNOSIS — E782 Mixed hyperlipidemia: Secondary | ICD-10-CM

## 2022-10-07 LAB — LIPID PANEL WITH LDL/HDL RATIO
Cholesterol, Total: 237 mg/dL — ABNORMAL HIGH (ref 100–199)
HDL: 60 mg/dL (ref >39)
LDL Chol Calc (NIH): 152 mg/dL — ABNORMAL HIGH (ref 0–99)
LDL/HDL Ratio: 2.5 ratio (ref 0.0–3.2)
Triglycerides: 141 mg/dL (ref 0–149)
VLDL Cholesterol Cal: 25 mg/dL (ref 5–40)

## 2022-10-07 LAB — COMPREHENSIVE METABOLIC PANEL
ALT: 14 IU/L (ref 0–32)
AST: 20 IU/L (ref 0–40)
Albumin/Globulin Ratio: 1.8 (ref 1.2–2.2)
Albumin: 3.9 g/dL (ref 3.9–4.9)
Alkaline Phosphatase: 71 IU/L (ref 44–121)
BUN/Creatinine Ratio: 15 (ref 12–28)
BUN: 17 mg/dL (ref 8–27)
Bilirubin Total: <0.2 mg/dL (ref 0.0–1.2)
CO2: 25 mmol/L (ref 20–29)
Calcium: 9 mg/dL (ref 8.7–10.3)
Chloride: 101 mmol/L (ref 96–106)
Creatinine, Ser: 1.15 mg/dL — ABNORMAL HIGH (ref 0.57–1.00)
Globulin, Total: 2.2 g/dL (ref 1.5–4.5)
Glucose: 79 mg/dL (ref 70–99)
Potassium: 4.5 mmol/L (ref 3.5–5.2)
Sodium: 141 mmol/L (ref 134–144)
Total Protein: 6.1 g/dL (ref 6.0–8.5)
eGFR: 54 mL/min/{1.73_m2} — ABNORMAL LOW (ref >59)

## 2022-10-07 LAB — CBC WITH DIFFERENTIAL/PLATELET
Basophils Absolute: 0.1 10*3/uL (ref 0.0–0.2)
Basos: 1 %
EOS (ABSOLUTE): 0.5 10*3/uL — ABNORMAL HIGH (ref 0.0–0.4)
Eos: 8 %
Hematocrit: 41.7 % (ref 34.0–46.6)
Hemoglobin: 13.9 g/dL (ref 11.1–15.9)
Immature Grans (Abs): 0 10*3/uL (ref 0.0–0.1)
Immature Granulocytes: 0 %
Lymphocytes Absolute: 1.7 10*3/uL (ref 0.7–3.1)
Lymphs: 27 %
MCH: 30.5 pg (ref 26.6–33.0)
MCHC: 33.3 g/dL (ref 31.5–35.7)
MCV: 92 fL (ref 79–97)
Monocytes Absolute: 0.6 10*3/uL (ref 0.1–0.9)
Monocytes: 9 %
Neutrophils Absolute: 3.5 10*3/uL (ref 1.4–7.0)
Neutrophils: 55 %
Platelets: 309 10*3/uL (ref 150–450)
RBC: 4.55 x10E6/uL (ref 3.77–5.28)
RDW: 12.2 % (ref 11.7–15.4)
WBC: 6.3 10*3/uL (ref 3.4–10.8)

## 2022-10-07 MED ORDER — ATORVASTATIN CALCIUM 10 MG PO TABS: 10.0000 mg | ORAL_TABLET | Freq: Every day | ORAL | 1 refills | Status: DC

## 2022-10-18 ENCOUNTER — Encounter: Payer: Self-pay | Admitting: Family Medicine

## 2022-10-18 ENCOUNTER — Ambulatory Visit (INDEPENDENT_AMBULATORY_CARE_PROVIDER_SITE_OTHER): Payer: Medicare Other | Admitting: Family Medicine

## 2022-10-18 VITALS — BP 108/64 | HR 66 | Ht 63.0 in

## 2022-10-18 DIAGNOSIS — M47812 Spondylosis without myelopathy or radiculopathy, cervical region: Secondary | ICD-10-CM | POA: Diagnosis not present

## 2022-10-18 DIAGNOSIS — M62838 Other muscle spasm: Secondary | ICD-10-CM

## 2022-10-18 MED ORDER — CYCLOBENZAPRINE HCL 10 MG PO TABS: 10.0000 mg | ORAL_TABLET | Freq: Every evening | ORAL | 0 refills | Status: DC | PRN

## 2022-10-24 DIAGNOSIS — M47812 Spondylosis without myelopathy or radiculopathy, cervical region: Secondary | ICD-10-CM | POA: Insufficient documentation

## 2022-10-24 NOTE — Progress Notes (Signed)
     Primary Care / Sports Medicine Office Visit  Patient Information:  Patient ID: Barbara Durham, female DOB: March 15, 1961 Age: 62 y.o. MRN: [id number]   Barbara Durham is a pleasant 62 y.o. female presenting with the following:  Chief Complaint  Patient presents with   Neck Pain    3 months into right shoulder    Vitals:   10/18/22 1039  BP: 108/64  Pulse: 66  SpO2: 98%   Vitals:   10/18/22 1039  Height: 5\' 3"  (1.6 m)   Body mass index is 33.66 kg/m.     Independent interpretation of notes and tests performed by another provider:   Independent interpretation of cervical spine x-rays demonstrates intervertebral narrowing at C4-5 and C6-7 with associated osteophyte formation, no acute osseous processes.  Procedures performed:   None  Pertinent History, Exam, Impression, and Recommendations:   Barbara Durham was seen today for neck pain.  Spondylosis of cervical region without myelopathy or radiculopathy Overview: Degenerative disc disease noted with disc space narrowing and marginal osteophytes at C4-5 through C6-7.  Assessment & Plan: Patient presents for cervicalgia with radiation to the right shoulder ongoing x 3 months, denies paresthesias, no weakness, OTC treatments.  Examination without focal findings about the shoulder, there is limited cervical ROM due to pain, asymmetric muscle tightness to the right paraspinous musculature, negative Spurling's.  Plan as follows: - Use heat around neck for muscle pain - Dose cyclobenzaprine nightly as-needed for muscle pain, side effect can be drowsiness - Perform activity as tolerated - Return in 4 weeks  Orders: -     Cyclobenzaprine HCl; Take 1 tablet (10 mg total) by mouth at bedtime as needed for muscle spasms.  Dispense: 30 tablet; Refill: 0     Orders & Medications Meds ordered this encounter  Medications   cyclobenzaprine (FLEXERIL) 10 MG tablet    Sig: Take 1 tablet (10 mg total) by mouth at  bedtime as needed for muscle spasms.    Dispense:  30 tablet    Refill:  0   No orders of the defined types were placed in this encounter.    Return in about 4 weeks (around 11/15/2022) for possible procedure.     [name], MD, [facility]   Primary Care Sports Medicine Primary Care and Sports Medicine at [facility]

## 2022-10-24 NOTE — Patient Instructions (Addendum)
-   Use heat around neck for muscle pain - Dose cyclobenzaprine nightly as-needed for muscle pain, side effect can be drowsiness - Perform activity as tolerated - Return in 4 weeks, contact for questions

## 2022-10-24 NOTE — Assessment & Plan Note (Signed)
Patient presents for cervicalgia with radiation to the right shoulder ongoing x 3 months, denies paresthesias, no weakness, OTC treatments.  Examination without focal findings about the shoulder, there is limited cervical ROM due to pain, asymmetric muscle tightness to the right paraspinous musculature, negative Spurling's.  Plan as follows: - Use heat around neck for muscle pain - Dose cyclobenzaprine nightly as-needed for muscle pain, side effect can be drowsiness - Perform activity as tolerated - Return in 4 weeks

## 2022-11-16 ENCOUNTER — Other Ambulatory Visit (INDEPENDENT_AMBULATORY_CARE_PROVIDER_SITE_OTHER): Payer: Medicare Other | Admitting: Radiology

## 2022-11-16 ENCOUNTER — Ambulatory Visit (INDEPENDENT_AMBULATORY_CARE_PROVIDER_SITE_OTHER): Payer: Medicare Other | Admitting: Family Medicine

## 2022-11-16 ENCOUNTER — Encounter: Payer: Self-pay | Admitting: Family Medicine

## 2022-11-16 VITALS — BP 102/78 | HR 86 | Ht 63.0 in | Wt 190.0 lb

## 2022-11-16 DIAGNOSIS — M25811 Other specified joint disorders, right shoulder: Secondary | ICD-10-CM

## 2022-11-16 DIAGNOSIS — M7521 Bicipital tendinitis, right shoulder: Secondary | ICD-10-CM

## 2022-11-16 DIAGNOSIS — M47812 Spondylosis without myelopathy or radiculopathy, cervical region: Secondary | ICD-10-CM | POA: Diagnosis not present

## 2022-11-16 MED ORDER — CYCLOBENZAPRINE HCL 10 MG PO TABS: 10.0000 mg | ORAL_TABLET | Freq: Every evening | ORAL | 0 refills | Status: DC | PRN
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
## 2022-11-16 NOTE — Assessment & Plan Note (Addendum)
She has noted roughly 20% improvement, worst pain at night when laying on right shoulder or on left side, ADLs such as getting dressed, driving.  Denies any radiating symptoms, no paresthesias.  Her examination today does demonstrate persistent right-sided paraspinal muscular tenderness, RC 5/5, painful with isolated supraspinatus, positive impingement, tenderness at the bicipital groove, with positive speeds, equivocal Yergason's.  Findings consistent with right-sided shoulder impingement with primary focality to supraspinatus, secondary biceps tendinitis.  - Patient elected to proceed with ultrasound-guided corticosteroid injections of the sites - Can transition to as needed cyclobenzaprine - Contact us at the 2-week mark or beyond for any persistent symptoms to direct next steps (advanced imaging to be considered)

## 2022-11-16 NOTE — Patient Instructions (Signed)
You have just been given a cortisone injection to reduce pain and inflammation. After the injection you may notice immediate relief of pain as a result of the Lidocaine. It is important to rest the area of the injection for 24 to 48 hours after the injection. There is a possibility of some temporary increased discomfort and swelling for up to 72 hours until the cortisone begins to work. If you do have pain, simply rest the joint and use ice. If you can tolerate over the counter medications, you can try Tylenol, Aleve, or Advil for added relief per package instructions. - Use cyclobenzaprine (muscle relaxer) nightly as-needed - Can use Pennsaid on clean, unbroken skin as-needed - Contact us at 2 weeks or beyond for any persistent symptoms - Follow-up as needed

## 2022-11-16 NOTE — Assessment & Plan Note (Signed)
See additional assessment(s) for plan details. 

## 2022-11-16 NOTE — Assessment & Plan Note (Signed)
She reports roughly 20% improvement after starting nightly cyclobenzaprine, still symptomatic with ADLs.  Given additional symptoms and findings this can be thought secondary paraspinal spasm.  - Continue nightly cyclobenzaprine on as-needed basis - Status update at 2 weeks/beyond if still significantly symptomatic, plan to pursue further cervical spine imaging, consideration of referral to spine group

## 2022-11-16 NOTE — Progress Notes (Signed)
Primary Care / Sports Medicine Office Visit  Patient Information:  Patient ID: Barbara Durham, female DOB: 03-17-1961 Age: 62 y.o. MRN: [id number]   Barbara Durham is a pleasant 62 y.o. 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  No results found.   Independent interpretation of notes and tests performed by another provider:   None  Procedures performed:   Procedure:  Injection of right subacromial shoulder under ultrasound guidance. Ultrasound guidance utilized for in-plane approach to the right subacromial space, hypoechoic region around visualized tendon noted with concern for bursitis Samsung HS60 device utilized with permanent recording / reporting. Verbal informed consent obtained and verified. Skin prepped in a sterile fashion. Ethyl chloride for topical local analgesia.  Completed without difficulty and tolerated well. Medication: triamcinolone acetonide 40 mg/mL suspension for injection 1 mL total and 2 mL lidocaine 1% without epinephrine utilized for needle placement anesthetic Advised to contact for fevers/chills, erythema, induration, drainage, or persistent bleeding.  Procedure:  Injection of right biceps tendon sheath under ultrasound guidance. Ultrasound guidance utilized for out of plane approach to the right biceps tendon sheath, thickened tendon noted Samsung HS60 device utilized with permanent recording / reporting. Verbal informed consent obtained and verified. Skin prepped in a sterile fashion. Ethyl chloride for topical local analgesia.  Completed without difficulty and tolerated well. Medication: triamcinolone  acetonide 40 mg/mL suspension for injection 1 mL total and 2 mL lidocaine 1% without epinephrine utilized for needle placement anesthetic Advised to contact for fevers/chills, erythema, induration, drainage, or persistent bleeding.    Pertinent History, Exam, Impression, and Recommendations:   Barbara Durham was seen today for spondylosis of cervical region without myelopathy or radicu.  Impingement of right shoulder Assessment & Plan: She has noted roughly 20% improvement, worst pain at night when laying on right shoulder or on left side, ADLs such as getting dressed, driving.  Denies any radiating symptoms, no paresthesias.  Her examination today does demonstrate persistent right-sided paraspinal muscular tenderness, RC 5/5, painful with isolated supraspinatus, positive impingement, tenderness at the bicipital groove, with positive speeds, equivocal Yergason's.  Findings consistent with right-sided shoulder impingement with primary focality to supraspinatus, secondary biceps tendinitis.  - Patient elected to proceed with ultrasound-guided corticosteroid injections of the sites - Can transition to as needed cyclobenzaprine - Contact us at the 2-week mark or beyond for any persistent symptoms to direct next steps (advanced imaging to be considered)  Orders: -     [place] LIMITED JOINT SPACE STRUCTURES UP RIGHT; Future  Biceps tendinitis, right Assessment & Plan: See additional assessment(s) for plan details.  Orders: -     [place] LIMITED JOINT SPACE STRUCTURES UP RIGHT; Future  Spondylosis of cervical region without myelopathy or radiculopathy Overview: Degenerative disc disease noted with disc space narrowing and marginal osteophytes at C4-5 through C6-7.  Assessment & Plan: She reports roughly 20% improvement after starting nightly cyclobenzaprine, still symptomatic with ADLs.  Given additional symptoms and findings this can be thought secondary paraspinal spasm.  - Continue nightly  cyclobenzaprine on as-needed basis - Status update at 2 weeks/beyond if still significantly  symptomatic, plan to pursue further cervical spine imaging, consideration of referral to spine group  Orders: -     Cyclobenzaprine HCl; Take 1 tablet (10 mg total) by mouth at bedtime as needed for muscle spasms.  Dispense: 30 tablet; Refill: 0     Orders & Medications Meds ordered this encounter  Medications   cyclobenzaprine (FLEXERIL) 10 MG tablet    Sig: Take 1 tablet (10 mg total) by mouth at bedtime as needed for muscle spasms.    Dispense:  30 tablet    Refill:  0   Orders Placed This Encounter  Procedures   [place] LIMITED JOINT SPACE STRUCTURES UP RIGHT     No follow-ups on file.     Jerrol Banana, MD, [facility]   Primary Care Sports Medicine Primary Care and Sports Medicine at [facility]

## 2022-11-18 ENCOUNTER — Telehealth: Payer: Medicare Other | Admitting: Psychiatry

## 2023-04-26 ENCOUNTER — Other Ambulatory Visit: Payer: Self-pay | Admitting: Family Medicine

## 2023-04-26 DIAGNOSIS — I10 Essential (primary) hypertension: Secondary | ICD-10-CM

## 2023-04-26 NOTE — Telephone Encounter (Signed)
Medication Refill - Medication: losartan-hydrochlorothiazide (HYZAAR) 100-25 MG tablet [[id number]    (call dr call in four pills, my appt is monday)   Has the patient contacted their pharmacy? No.   Preferred Pharmacy (with phone number or street name): [facility] [address] [facility] - [place], [place] - [address] AT [facility] [address]  [address] [place], [place] [address]  Phone:  [phone]  Fax:  [phone]  DEA #:  [id number] DAW Reason: --    Has the patient been seen for an appointment in the last year OR does the patient have an upcoming appointment? Yes.    Agent: Please be advised that RX refills may take up to 3 business days. We ask that you follow-up with your pharmacy.

## 2023-04-26 NOTE — Telephone Encounter (Signed)
Requested medication (s) are due for refill today: yes  Requested medication (s) are on the active medication list: yes    Last refill: 10/06/22  #90 1 refill  Future visit scheduled yes 05/01/23  Notes to clinic:Failed due to labs, please review. Thank you.  Requested Prescriptions  Pending Prescriptions Disp Refills   losartan-hydrochlorothiazide (HYZAAR) 100-25 MG tablet 90 tablet 1    Sig: Take 1 tablet by mouth daily.     Cardiovascular: ARB + Diuretic Combos Failed - 04/26/2023 10:25 AM      Failed - K in normal range and within 180 days    Potassium  Date Value Ref Range Status  10/06/2022 4.5 3.5 - 5.2 mmol/L Final  10/29/2014 2.7 (L) mmol/L Final    Comment:    3.5-5.1 NOTE: New Reference Range  09/30/14          Failed - Na in normal range and within 180 days    Sodium  Date Value Ref Range Status  10/06/2022 141 134 - 144 mmol/L Final  10/29/2014 135 mmol/L Final    Comment:    135-145 NOTE: New Reference Range  09/30/14          Failed - Cr in normal range and within 180 days    Creatinine  Date Value Ref Range Status  10/29/2014 1.45 (H) mg/dL Final    Comment:    0.98-1.19 NOTE: New Reference Range  09/30/14    Creatinine, Ser  Date Value Ref Range Status  10/06/2022 1.15 (H) 0.57 - 1.00 mg/dL Final         Failed - eGFR is 10 or above and within 180 days    EGFR (African American)  Date Value Ref Range Status  10/29/2014 47 (L)  Final   GFR calc Af Amer  Date Value Ref Range Status  04/07/2020 67 >59 mL/min/1.73 Final    Comment:    **[facility] currently reports eGFR in compliance with the current**   recommendations of the [employer]. [facility] will   update reporting as new guidelines are published from the NKF-ASN   Task force.    EGFR (Non-African Amer.)  Date Value Ref Range Status  10/29/2014 41 (L)  Final    Comment:    eGFR values <58mL/min/1.73 m2 may be an indication of chronic kidney disease  (CKD). Calculated eGFR is useful in patients with stable renal function. The eGFR calculation will not be reliable in acutely ill patients when serum creatinine is changing rapidly. It is not useful in patients on dialysis. The eGFR calculation may not be applicable to patients at the low and high extremes of body sizes, pregnant women, and vegetarians.    GFR calc non Af Amer  Date Value Ref Range Status  04/07/2020 58 (L) >59 mL/min/1.73 Final   eGFR  Date Value Ref Range Status  10/06/2022 54 (L) >59 mL/min/1.73 Final         Failed - Valid encounter within last 6 months    Recent Outpatient Visits           5 months ago Impingement of right shoulder   [place] [facility] at [facility], Ocie Bob, MD   6 months ago Spondylosis of cervical region without myelopathy or radiculopathy   [facility] & Sports Medicine at [facility], Ocie Bob, MD   6 months ago Essential hypertension   [facility] Primary Care & Sports Medicine at [facility]  Duanne Limerick, MD   9 months ago Acute maxillary sinusitis, recurrence not specified   [place] [facility] at [facility], [facility]   1 year ago Essential hypertension   [place] [facility] at [facility], MD       Future Appointments             In 5 days Duanne Limerick, MD [facility] Primary Care & Sports Medicine at [facility], [facility]            Passed - Patient is not pregnant      Passed - Last BP in normal range    BP Readings from Last 1 Encounters:  11/16/22 102/78

## 2023-04-27 ENCOUNTER — Other Ambulatory Visit: Payer: Self-pay | Admitting: Family Medicine

## 2023-04-27 DIAGNOSIS — I10 Essential (primary) hypertension: Secondary | ICD-10-CM

## 2023-04-27 NOTE — Telephone Encounter (Signed)
Requested Prescriptions  Pending Prescriptions Disp Refills   losartan-hydrochlorothiazide (HYZAAR) 100-25 MG tablet [Pharmacy Med Name: LOSARTAN-HCTZ 100-25 MG TAB[*]] 90 tablet 0    Sig: TAKE ONE TABLET BY MOUTH ONE TIME DAILY     Cardiovascular: ARB + Diuretic Combos Failed - 04/27/2023 11:19 AM      Failed - K in normal range and within 180 days    Potassium  Date Value Ref Range Status  10/06/2022 4.5 3.5 - 5.2 mmol/L Final  10/29/2014 2.7 (L) mmol/L Final    Comment:    3.5-5.1 NOTE: New Reference Range  09/30/14          Failed - Na in normal range and within 180 days    Sodium  Date Value Ref Range Status  10/06/2022 141 134 - 144 mmol/L Final  10/29/2014 135 mmol/L Final    Comment:    135-145 NOTE: New Reference Range  09/30/14          Failed - Cr in normal range and within 180 days    Creatinine  Date Value Ref Range Status  10/29/2014 1.45 (H) mg/dL Final    Comment:    1.61-0.96 NOTE: New Reference Range  09/30/14    Creatinine, Ser  Date Value Ref Range Status  10/06/2022 1.15 (H) 0.57 - 1.00 mg/dL Final         Failed - eGFR is 10 or above and within 180 days    EGFR (African American)  Date Value Ref Range Status  10/29/2014 47 (L)  Final   GFR calc Af Amer  Date Value Ref Range Status  04/07/2020 67 >59 mL/min/1.73 Final    Comment:    **[facility] currently reports eGFR in compliance with the current**   recommendations of the [employer]. [facility] will   update reporting as new guidelines are published from the NKF-ASN   Task force.    EGFR (Non-African Amer.)  Date Value Ref Range Status  10/29/2014 41 (L)  Final    Comment:    eGFR values <62mL/min/1.73 m2 may be an indication of chronic kidney disease (CKD). Calculated eGFR is useful in patients with stable renal function. The eGFR calculation will not be reliable in acutely ill patients when serum creatinine is changing rapidly. It is not useful in patients on  dialysis. The eGFR calculation may not be applicable to patients at the low and high extremes of body sizes, pregnant women, and vegetarians.    GFR calc non Af Amer  Date Value Ref Range Status  04/07/2020 58 (L) >59 mL/min/1.73 Final   eGFR  Date Value Ref Range Status  10/06/2022 54 (L) >59 mL/min/1.73 Final         Failed - Valid encounter within last 6 months    Recent Outpatient Visits           5 months ago Impingement of right shoulder   [place] [facility] at [facility], Ocie Bob, MD   6 months ago Spondylosis of cervical region without myelopathy or radiculopathy   [place] [facility] at [facility], Ocie Bob, MD   6 months ago Essential hypertension   [place] [facility], [facility]   10 months ago Acute maxillary sinusitis, recurrence not specified   [place] [facility] at [facility], MD   1 year ago Essential hypertension  [facility] Primary Care & Sports Medicine at [facility], MD       Future Appointments             In 4 days Duanne Limerick, MD [facility] Primary Care & Sports Medicine at [facility], [facility]            Passed - Patient is not pregnant      Passed - Last BP in normal range    BP Readings from Last 1 Encounters:  11/16/22 102/78

## 2023-05-01 ENCOUNTER — Encounter: Payer: Self-pay | Admitting: Family Medicine

## 2023-05-01 ENCOUNTER — Ambulatory Visit (INDEPENDENT_AMBULATORY_CARE_PROVIDER_SITE_OTHER): Payer: Medicare Other | Admitting: Family Medicine

## 2023-05-01 VITALS — BP 118/70 | HR 72 | Ht 63.0 in | Wt 183.0 lb

## 2023-05-01 DIAGNOSIS — I10 Essential (primary) hypertension: Secondary | ICD-10-CM | POA: Diagnosis not present

## 2023-05-01 DIAGNOSIS — Z23 Encounter for immunization: Secondary | ICD-10-CM | POA: Diagnosis not present

## 2023-05-01 DIAGNOSIS — E782 Mixed hyperlipidemia: Secondary | ICD-10-CM | POA: Diagnosis not present

## 2023-05-01 MED ORDER — ATORVASTATIN CALCIUM 10 MG PO TABS: 10.0000 mg | ORAL_TABLET | Freq: Every day | ORAL | 1 refills | Status: DC

## 2023-05-01 MED ORDER — LOSARTAN POTASSIUM-HCTZ 100-25 MG PO TABS: 1.0000 | ORAL_TABLET | Freq: Every day | ORAL | 0 refills | Status: DC

## 2023-05-01 MED ORDER — LOSARTAN POTASSIUM-HCTZ 100-25 MG PO TABS: 1.0000 | ORAL_TABLET | Freq: Every day | ORAL | 1 refills | Status: DC

## 2023-05-01 NOTE — Progress Notes (Signed)
Date:  05/01/2023   Name:  Barbara Durham   DOB:  October 23, 1960   MRN:  [id number]   Chief Complaint: Hypertension  Hypertension This is a chronic problem. The current episode started more than 1 year ago. The problem has been gradually improving since onset. The problem is controlled. Pertinent negatives include no anxiety, blurred vision, chest pain, headaches, malaise/fatigue, neck pain, orthopnea, palpitations, peripheral edema, PND, shortness of breath or sweats. There are no associated agents to hypertension. Risk factors for coronary artery disease include dyslipidemia. Past treatments include angiotensin blockers and diuretics. The current treatment provides moderate improvement. There are no compliance problems.  There is no history of angina, kidney disease, CAD/MI, CVA, heart failure, left ventricular hypertrophy, PVD or retinopathy. There is no history of chronic renal disease, a hypertension causing med or renovascular disease.  Hyperlipidemia This is a chronic problem. The current episode started more than 1 year ago. The problem is controlled. Recent lipid tests were reviewed and are normal. She has no history of chronic renal disease, diabetes, hypothyroidism, liver disease, obesity or nephrotic syndrome. There are no known factors aggravating her hyperlipidemia. Pertinent negatives include no chest pain, myalgias or shortness of breath. Current antihyperlipidemic treatment includes statins. The current treatment provides moderate improvement of lipids. There are no compliance problems.  Risk factors for coronary artery disease include dyslipidemia and hypertension.    Lab Results  Component Value Date   NA 141 10/06/2022   K 4.5 10/06/2022   CO2 25 10/06/2022   GLUCOSE 79 10/06/2022   BUN 17 10/06/2022   CREATININE 1.15 (H) 10/06/2022   CALCIUM 9.0 10/06/2022   EGFR 54 (L) 10/06/2022   GFRNONAA 58 (L) 04/07/2020   Lab Results  Component Value Date   CHOL 237 (H)  10/06/2022   HDL 60 10/06/2022   LDLCALC 152 (H) 10/06/2022   TRIG 141 10/06/2022   CHOLHDL 4.2 05/15/2017   Lab Results  Component Value Date   TSH 1.170 04/08/2022   No results found for: "HGBA1C" Lab Results  Component Value Date   WBC 6.3 10/06/2022   HGB 13.9 10/06/2022   HCT 41.7 10/06/2022   MCV 92 10/06/2022   PLT 309 10/06/2022   Lab Results  Component Value Date   ALT 14 10/06/2022   AST 20 10/06/2022   ALKPHOS 71 10/06/2022   BILITOT <0.2 10/06/2022   No results found for: "25OHVITD2", "25OHVITD3", "VD25OH"   Review of Systems  Constitutional: Negative.  Negative for chills, fatigue, fever, malaise/fatigue and unexpected weight change.  HENT:  Negative for congestion, ear discharge, ear pain, rhinorrhea, sinus pressure, sneezing and sore throat.   Eyes:  Negative for blurred vision.  Respiratory:  Negative for apnea, cough, shortness of breath, wheezing and stridor.   Cardiovascular:  Negative for chest pain, palpitations, orthopnea, leg swelling and PND.  Gastrointestinal:  Negative for abdominal pain, blood in stool, constipation, diarrhea and nausea.  Genitourinary:  Negative for dysuria, flank pain, frequency, hematuria, urgency and vaginal discharge.  Musculoskeletal:  Negative for arthralgias, back pain, myalgias and neck pain.  Skin:  Negative for rash.  Neurological:  Negative for dizziness, weakness and headaches.  Hematological:  Negative for adenopathy. Does not bruise/bleed easily.  Psychiatric/Behavioral:  Negative for dysphoric mood. The patient is not nervous/anxious.     Patient Active Problem List   Diagnosis Date Noted   Impingement of right shoulder 11/16/2022   Biceps tendinitis, right 11/16/2022   Spondylosis of cervical region without myelopathy  or radiculopathy 10/24/2022   History of total hysterectomy 11/02/2021   Ganglion cyst of volar aspect of right wrist 10/20/2020   Primary osteoarthritis of first carpometacarpal joint of  right hand 10/20/2020   Primary insomnia 03/19/2020   Hypertension 01/21/2015   Back ache 06/14/2013   Lumbar transverse process fracture (HCC) 06/14/2013    No Known Allergies  Past Surgical History:  Procedure Laterality Date   arm fracture Left    COLONOSCOPY  2012   normal   CYSTOSCOPY  11/2011   DENTAL SURGERY     FOOT SURGERY Bilateral    LAPAROSCOPY  04/10/1997   LSO  1998   with LOA   OOPHORECTOMY     ORIF ORBITAL FRACTURE Left 01/16/2018   Procedure: OPEN REDUCTION INTERNAL FIXATION ORBITAL BLOW OUT FRACTURE;  Surgeon: Geanie Logan, MD;  [facility];  Service: ENT;  Laterality: Left;  LEIBINGER SET    WIRE MESH   TOTAL ABDOMINAL HYSTERECTOMY  1993   with RSO   TUBAL LIGATION  1989    Social History   Tobacco Use   Smoking status: Never   Smokeless tobacco: Never  Vaping Use   Vaping status: Never Used  Substance Use Topics   Alcohol use: Not Currently    Alcohol/week: 0.0 - 1.0 standard drinks of alcohol   Drug use: No     Medication list has been reviewed and updated.  Current Meds  Medication Sig   albuterol (VENTOLIN HFA) 108 (90 Base) MCG/ACT inhaler Inhale 2 puffs into the lungs every 6 (six) hours as needed for wheezing or shortness of breath.   Aspirin-Caffeine (BC FAST PAIN RELIEF ARTHRITIS PO) Take 1 packet by mouth in the morning and at bedtime.   atorvastatin (LIPITOR) 10 MG tablet Take 1 tablet (10 mg total) by mouth daily.   cyclobenzaprine (FLEXERIL) 10 MG tablet Take 1 tablet (10 mg total) by mouth at bedtime as needed for muscle spasms.   Diclofenac Sodium 2 % SOLN Place 1 spray onto the skin 2 (two) times daily as needed.   losartan-hydrochlorothiazide (HYZAAR) 100-25 MG tablet TAKE ONE TABLET BY MOUTH ONE TIME DAILY       05/01/2023    9:52 AM 10/06/2022   10:14 AM 04/08/2022    3:35 PM 11/02/2021   10:50 AM  GAD 7 : Generalized Anxiety Score  Nervous, Anxious, on Edge 0 0 0 0  Control/stop worrying 0 0 0 0   Worry too much - different things 0 0 0 0  Trouble relaxing 0 0 0 0  Restless 0 0 0 0  Easily annoyed or irritable 0 0 0 0  Afraid - awful might happen 0 0 0 0  Total GAD 7 Score 0 0 0 0  Anxiety Difficulty Not difficult at all Not difficult at all Not difficult at all Not difficult at all       05/01/2023    9:52 AM 10/06/2022   10:14 AM 05/27/2022    3:30 PM  Depression screen PHQ 2/9  Decreased Interest 0 0 0  Down, Depressed, Hopeless 0 0 0  PHQ - 2 Score 0 0 0  Altered sleeping 0 0   Tired, decreased energy 0 0   Change in appetite 0 0   Feeling bad or failure about yourself  0 0   Trouble concentrating 0 0   Moving slowly or fidgety/restless 0 0   Suicidal thoughts 0 0   PHQ-9 Score 0 0  Difficult doing work/chores Not difficult at all Not difficult at all     BP Readings from Last 3 Encounters:  05/01/23 118/70  11/16/22 102/78  10/18/22 108/64    Physical Exam Vitals and nursing note reviewed. Exam conducted with a chaperone present.  Constitutional:      General: She is not in acute distress.    Appearance: She is not diaphoretic.  HENT:     Head: Normocephalic and atraumatic.     Right Ear: Tympanic membrane and external ear normal.     Left Ear: Tympanic membrane and external ear normal.     Nose: Nose normal. No congestion or rhinorrhea.     Mouth/Throat:     Mouth: Mucous membranes are moist.  Eyes:     General:        Right eye: No discharge.        Left eye: No discharge.     Conjunctiva/sclera: Conjunctivae normal.     Pupils: Pupils are equal, round, and reactive to light.  Neck:     Thyroid: No thyromegaly.     Vascular: No JVD.  Cardiovascular:     Rate and Rhythm: Normal rate and regular rhythm.     Heart sounds: Normal heart sounds. No murmur heard.    No friction rub. No gallop.  Pulmonary:     Effort: Pulmonary effort is normal. No respiratory distress.     Breath sounds: Normal breath sounds. No wheezing, rhonchi or rales.   Abdominal:     General: Bowel sounds are normal.     Palpations: Abdomen is soft. There is no mass.     Tenderness: There is no abdominal tenderness. There is no guarding.  Musculoskeletal:        General: Normal range of motion.     Cervical back: Normal range of motion and neck supple.  Lymphadenopathy:     Cervical: No cervical adenopathy.  Skin:    General: Skin is warm and dry.  Neurological:     Mental Status: She is alert.     Deep Tendon Reflexes: Reflexes are normal and symmetric.     Wt Readings from Last 3 Encounters:  05/01/23 183 lb (83 kg)  11/16/22 190 lb (86.2 kg)  10/06/22 190 lb (86.2 kg)    BP 118/70   Pulse 72   Ht 5\' 3"  (1.6 m)   Wt 183 lb (83 kg)   SpO2 97%   BMI 32.42 kg/m   Assessment and Plan:  1. Essential hypertension Chronic.  Controlled.  Stable.  Blood pressure 118/70.  Asymptomatic.  Tolerating medication well.  Continue losartan hydrochlorothiazide 100-25 mg once a day.  Will recheck in 6 months.  Will check CMP for electrolytes and GFR. - losartan-hydrochlorothiazide (HYZAAR) 100-25 MG tablet; Take 1 tablet by mouth daily.  Dispense: 90 tablet; Refill: 1 - Comprehensive metabolic panel  2. Mixed hyperlipidemia Chronic.  Controlled.  Stable.  Asymptomatic.  Tolerating medication without myalgias.  Will check lipid panel that was elevated on last time the patient is fasting at this time and will continue atorvastatin at 10 mg with perhaps have an increase if similar readings are in the past - atorvastatin (LIPITOR) 10 MG tablet; Take 1 tablet (10 mg total) by mouth daily.  Dispense: 90 tablet; Refill: 1 - Lipid Panel With LDL/HDL Ratio  3. Need for influenza vaccination Discussed and administered - Flu vaccine trivalent PF, 6mos and older(Flulaval,Afluria,Fluarix,Fluzone)    Elizabeth Sauer, MD

## 2023-05-02 ENCOUNTER — Encounter: Payer: Self-pay | Admitting: Family Medicine

## 2023-05-02 LAB — COMPREHENSIVE METABOLIC PANEL
ALT: 15 [IU]/L (ref 0–32)
AST: 21 [IU]/L (ref 0–40)
Albumin: 3.6 g/dL — ABNORMAL LOW (ref 3.9–4.9)
Alkaline Phosphatase: 67 [IU]/L (ref 44–121)
BUN/Creatinine Ratio: 12 (ref 12–28)
BUN: 13 mg/dL (ref 8–27)
Bilirubin Total: 0.2 mg/dL (ref 0.0–1.2)
CO2: 23 mmol/L (ref 20–29)
Calcium: 9.3 mg/dL (ref 8.7–10.3)
Chloride: 105 mmol/L (ref 96–106)
Creatinine, Ser: 1.06 mg/dL — ABNORMAL HIGH (ref 0.57–1.00)
Globulin, Total: 1.9 g/dL (ref 1.5–4.5)
Glucose: 91 mg/dL (ref 70–99)
Potassium: 4.5 mmol/L (ref 3.5–5.2)
Sodium: 142 mmol/L (ref 134–144)
Total Protein: 5.5 g/dL — ABNORMAL LOW (ref 6.0–8.5)
eGFR: 59 mL/min/{1.73_m2} — ABNORMAL LOW (ref >59)

## 2023-05-02 LAB — LIPID PANEL WITH LDL/HDL RATIO
Cholesterol, Total: 195 mg/dL (ref 100–199)
HDL: 46 mg/dL (ref >39)
LDL Chol Calc (NIH): 117 mg/dL — ABNORMAL HIGH (ref 0–99)
LDL/HDL Ratio: 2.5 {ratio} (ref 0.0–3.2)
Triglycerides: 180 mg/dL — ABNORMAL HIGH (ref 0–149)
VLDL Cholesterol Cal: 32 mg/dL (ref 5–40)

## 2023-05-18 DIAGNOSIS — J0100 Acute maxillary sinusitis, unspecified: Secondary | ICD-10-CM | POA: Diagnosis not present

## 2023-05-18 DIAGNOSIS — J029 Acute pharyngitis, unspecified: Secondary | ICD-10-CM | POA: Diagnosis not present

## 2023-05-18 DIAGNOSIS — R059 Cough, unspecified: Secondary | ICD-10-CM | POA: Diagnosis not present

## 2023-06-01 ENCOUNTER — Ambulatory Visit: Payer: Medicare Other | Admitting: Emergency Medicine

## 2023-06-01 VITALS — Ht 62.0 in | Wt 180.0 lb

## 2023-06-01 DIAGNOSIS — Z1159 Encounter for screening for other viral diseases: Secondary | ICD-10-CM

## 2023-06-01 DIAGNOSIS — Z1231 Encounter for screening mammogram for malignant neoplasm of breast: Secondary | ICD-10-CM

## 2023-06-01 DIAGNOSIS — Z1211 Encounter for screening for malignant neoplasm of colon: Secondary | ICD-10-CM

## 2023-06-01 DIAGNOSIS — Z0000 Encounter for general adult medical examination without abnormal findings: Secondary | ICD-10-CM

## 2023-06-01 NOTE — Patient Instructions (Addendum)
Barbara Durham , Thank you for taking time to come for your Medicare Wellness Visit. I appreciate your ongoing commitment to your health goals. Please review the following plan we discussed and let me know if I can assist you in the future.   Referrals/Orders/Follow-Ups/Clinician Recommendations:  I have placed an order for the cologuard kit to screen for colon cancer. You should receive this in the mail. I placed an order for Hepatitis C Screening to be done with your next labwork with Dr. Yetta Barre.  I placed an order for a mammogram. Call [facility] @ [phone] to schedule at your earliest convenience. You can not have this prior to 06/16/23 for insurance purposes.  This is a list of the screening recommended for you and due dates:  Health Maintenance  Topic Date Due   Zoster (Shingles) Vaccine (1 of 2) Never done   Colon Cancer Screening  07/25/2020   Mammogram  06/15/2023   Medicare Annual Wellness Visit  05/31/2024   DEXA scan (bone density measurement)  06/14/2024   DTaP/Tdap/Td vaccine (3 - Td or Tdap) 07/06/2026   Flu Shot  Completed   HPV Vaccine  Aged Out   COVID-19 Vaccine  Discontinued   Hepatitis C Screening  Discontinued   HIV Screening  Discontinued    Advanced directives: (ACP Link)Information on Advanced Care Planning can be found at [facility] Advance Health Care Directives Advance Health Care Directives ([email or web address]   Once you have completed the forms, please bring a copy of your health care power of attorney and living will to the office to be added to your chart at your convenience.   Next Medicare Annual Wellness Visit scheduled for next year: Yes, 06/06/24 @ 1:50pm

## 2023-06-01 NOTE — Progress Notes (Signed)
Subjective:   Barbara Durham is a 62 y.o. female who presents for Medicare Annual (Subsequent) preventive examination.  Visit Complete: Virtual I connected with  Barbara Durham on 06/01/23 by a audio enabled telemedicine application and verified that I am speaking with the correct person using two identifiers.  Patient Location: Home  Provider Location: Home Office  I discussed the limitations of evaluation and management by telemedicine. The patient expressed understanding and agreed to proceed.  Vital Signs: Because this visit was a virtual/telehealth visit, some criteria may be missing or patient reported. Any vitals not documented were not able to be obtained and vitals that have been documented are patient reported.   Cardiac Risk Factors include: hypertension;obesity (BMI >30kg/m2)     Objective:    Today's Vitals   06/01/23 1544  Weight: 180 lb (81.6 kg)  Height: 5\' 2"  (1.575 m)   Body mass index is 32.92 kg/m.     06/01/2023    3:53 PM 05/17/2021    2:46 PM 05/13/2020    3:01 PM 08/06/2018   11:19 AM 01/16/2018    6:50 AM 12/12/2016    2:07 PM 10/12/2016    1:13 PM  Advanced Directives  Does Patient Have a Medical Advance Directive? No No No  No    Would patient like information on creating a medical advance directive? Yes (MAU/Ambulatory/Procedural Areas - Information given) Yes (MAU/Ambulatory/Procedural Areas - Information given) Yes (MAU/Ambulatory/Procedural Areas - Information given)  No - Patient declined       Information is confidential and restricted. Go to Review Flowsheets to unlock data.     Current Medications (verified) Outpatient Encounter Medications as of 06/01/2023  Medication Sig   albuterol (VENTOLIN HFA) 108 (90 Base) MCG/ACT inhaler Inhale 2 puffs into the lungs every 6 (six) hours as needed for wheezing or shortness of breath.   Aspirin-Caffeine (BC FAST PAIN RELIEF ARTHRITIS PO) Take 1 packet by mouth in the morning and at  bedtime.   atorvastatin (LIPITOR) 10 MG tablet Take 1 tablet (10 mg total) by mouth daily.   cyclobenzaprine (FLEXERIL) 10 MG tablet Take 1 tablet (10 mg total) by mouth at bedtime as needed for muscle spasms.   Diclofenac Sodium 2 % SOLN Place 1 spray onto the skin 2 (two) times daily as needed.   losartan-hydrochlorothiazide (HYZAAR) 100-25 MG tablet Take 1 tablet by mouth daily.   No facility-administered encounter medications on file as of 06/01/2023.    Allergies (verified) Patient has no known allergies.   History: Past Medical History:  Diagnosis Date   Anxiety    Depression    Endometriosis    GERD (gastroesophageal reflux disease)    Hemorrhoids    Hypertension    Nocturia    enuresis   Osteopenia 10/2009, 6/20   in spine and hip   PONV (postoperative nausea and vomiting)    Stress incontinence, female    Past Surgical History:  Procedure Laterality Date   arm fracture Left    COLONOSCOPY  2012   normal   CYSTOSCOPY  11/2011   DENTAL SURGERY     FOOT SURGERY Bilateral    LAPAROSCOPY  04/10/1997   LSO  1998   with LOA   OOPHORECTOMY     ORIF ORBITAL FRACTURE Left 01/16/2018   Procedure: OPEN REDUCTION INTERNAL FIXATION ORBITAL BLOW OUT FRACTURE;  Surgeon: Geanie Logan, MD;  [facility]: [facility];  Service: ENT;  Laterality: Left;  Mady Haagensen MESH  TOTAL ABDOMINAL HYSTERECTOMY  1993   with RSO   TUBAL LIGATION  1989   Family History  Problem Relation Age of Onset   Heart disease Mother    Hypertension Mother    Dementia Mother    Depression Mother    Diabetes Mother        type 2   Alzheimer's disease Father    Stroke Father    CVA Father    Prostate cancer Father    Heart disease Sister    Osteoporosis Sister    Arthritis Brother    Depression Brother    Depression Brother    Gout Brother    Hypertension Brother    Breast cancer Paternal Aunt        2s   Social History   Socioeconomic History   Marital status:  Divorced    Spouse name: Not on file   Number of children: 1   Years of education: Not on file   Highest education level: GED or equivalent  Occupational History   Occupation: Disability  Tobacco Use   Smoking status: Never   Smokeless tobacco: Never  Vaping Use   Vaping status: Never Used  Substance and Sexual Activity   Alcohol use: Not Currently    Alcohol/week: 0.0 - 1.0 standard drinks of alcohol   Drug use: No   Sexual activity: Not Currently    Partners: Male    Birth control/protection: None  Other Topics Concern   Not on file  Social History Narrative   Pt lives alone   Social Determinants of Health   Financial Resource Strain: Low Risk  (06/01/2023)   Overall Financial Resource Strain (CARDIA)    Difficulty of Paying Living Expenses: Not hard at all  Food Insecurity: No Food Insecurity (06/01/2023)   Hunger Vital Sign    Worried About Running Out of Food in the Last Year: Never true    Ran Out of Food in the Last Year: Never true  Transportation Needs: No Transportation Needs (06/01/2023)   PRAPARE - Administrator, Civil Service (Medical): No    Lack of Transportation (Non-Medical): No  Physical Activity: Insufficiently Active (06/01/2023)   Exercise Vital Sign    Days of Exercise per Week: 3 days    Minutes of Exercise per Session: 40 min  Stress: No Stress Concern Present (06/01/2023)   [employer] of Occupational Health - Occupational Stress Questionnaire    Feeling of Stress : Not at all  Social Connections: Socially Isolated (06/01/2023)   Social Connection and Isolation Panel [NHANES]    Frequency of Communication with Friends and Family: More than three times a week    Frequency of Social Gatherings with Friends and Family: More than three times a week    Attends Religious Services: Never    Database administrator or Organizations: No    Attends Engineer, structural: Never    Marital Status: Divorced    Tobacco  Counseling Counseling given: Not Answered   Clinical Intake:  Pre-visit preparation completed: Yes  Pain : No/denies pain     BMI - recorded: 32.92 Nutritional Status: BMI > 30  Obese Nutritional Risks: None Diabetes: No  How often do you need to have someone help you when you read instructions, pamphlets, or other written materials from your doctor or pharmacy?: 1 - Never  Interpreter Needed?: No  Information entered by :: Tora Kindred, CMA   Activities of Daily Living    06/01/2023  3:46 PM  In your present state of health, do you have any difficulty performing the following activities:  Hearing? 0  Vision? 0  Difficulty concentrating or making decisions? 0  Walking or climbing stairs? 0  Dressing or bathing? 0  Doing errands, shopping? 0  Preparing Food and eating ? N  Using the Toilet? N  In the past six months, have you accidently leaked urine? N  Do you have problems with loss of bowel control? N  Managing your Medications? N  Managing your Finances? N  Housekeeping or managing your Housekeeping? N    Patient Care Team: Duanne Limerick, MD as PCP - General (Family Medicine) Neysa Hotter, MD as Consulting Physician (Psychiatry) Jerrol Banana, MD as Consulting Physician (Sports Medicine)  Indicate any recent Medical Services you may have received from other than [facility] providers in the past year (date may be approximate).     Assessment:   This is a routine wellness examination for [place].  Hearing/Vision screen Hearing Screening - Comments:: Denies hearing loss Vision Screening - Comments:: Gets eye exams   Goals Addressed               This Visit's Progress     Patient Stated (pt-stated)        Exercise more and continue to lose weight      Depression Screen    06/01/2023    3:51 PM 05/01/2023    9:52 AM 10/06/2022   10:14 AM 05/27/2022    3:30 PM 04/08/2022    3:35 PM 11/02/2021   10:50 AM 10/26/2021    3:52 PM  PHQ 2/9 Scores   PHQ - 2 Score 0 0 0 0 0 0 0  PHQ- 9 Score 0 0 0  0 0 0    Fall Risk    06/01/2023    3:54 PM 05/01/2023    9:52 AM 10/06/2022   10:15 AM 05/27/2022    3:30 PM 04/08/2022    3:35 PM  Fall Risk   Falls in the past year? 0 0 0 0 0  Number falls in past yr: 0 0 0 0 0  Injury with Fall? 0 0 0 0 0  Risk for fall due to : No Fall Risks No Fall Risks No Fall Risks No Fall Risks No Fall Risks  Follow up Falls prevention discussed Falls evaluation completed Falls evaluation completed Falls evaluation completed Falls evaluation completed    MEDICARE RISK AT HOME: Medicare Risk at Home Any stairs in or around the home?: Yes If so, are there any without handrails?: No Home free of loose throw rugs in walkways, pet beds, electrical cords, etc?: Yes Adequate lighting in your home to reduce risk of falls?: Yes Life alert?: No Use of a cane, walker or w/c?: No Grab bars in the bathroom?: Yes Shower chair or bench in shower?: No Elevated toilet seat or a handicapped toilet?: No  TIMED UP AND GO:  Was the test performed?  No    Cognitive Function:        06/01/2023    3:55 PM 05/27/2022    3:32 PM  6CIT Screen  What Year? 0 points 0 points  What month? 0 points 0 points  What time? 0 points 0 points  Count back from 20 0 points 0 points  Months in reverse 2 points 0 points  Repeat phrase 0 points 0 points  Total Score 2 points 0 points    Immunizations Immunization History  Administered Date(s) Administered   Influenza, Seasonal, Injecte, Preservative Fre 05/01/2023   Influenza,inj,Quad PF,6+ Mos 07/06/2016, 05/15/2017, 09/05/2018, 09/23/2019, 06/04/2020, 04/07/2021, 04/08/2022   PFIZER(Purple Top)SARS-COV-2 Vaccination 04/06/2020, 04/27/2020   Tdap 05/19/2013, 07/06/2016    TDAP status: Up to date  Flu Vaccine status: Up to date  Pneumococcal vaccine status: Up to date  Covid-19 vaccine status: Declined, Education has been provided regarding the importance of this vaccine  but patient still declined. Advised may receive this vaccine at local pharmacy or Health Dept.or vaccine clinic. Aware to provide a copy of the vaccination record if obtained from local pharmacy or Health Dept. Verbalized acceptance and understanding.  Qualifies for Shingles Vaccine? Yes   Zostavax completed No   Shingrix Completed?: No.    Education has been provided regarding the importance of this vaccine. Patient has been advised to call insurance company to determine out of pocket expense if they have not yet received this vaccine. Advised may also receive vaccine at local pharmacy or Health Dept. Verbalized acceptance and understanding.  Screening Tests Health Maintenance  Topic Date Due   Zoster Vaccines- Shingrix (1 of 2) Never done   Colonoscopy  07/25/2020   Medicare Annual Wellness (AWV)  05/31/2024   MAMMOGRAM  06/14/2024   DEXA SCAN  06/14/2024   DTaP/Tdap/Td (3 - Td or Tdap) 07/06/2026   INFLUENZA VACCINE  Completed   HPV VACCINES  Aged Out   COVID-19 Vaccine  Discontinued   Hepatitis C Screening  Discontinued   HIV Screening  Discontinued    Health Maintenance  Health Maintenance Due  Topic Date Due   Zoster Vaccines- Shingrix (1 of 2) Never done   Colonoscopy  07/25/2020    Colorectal cancer screening: Type of screening: Colonoscopy. Completed 07/1110. Repeat every 10 years. Patient declined colonoscopy, but is willing to do a cologuard  Mammogram status: Completed 06/14/22. Repeat every year  Bone Density status: Completed 06/14/22. Results reflect: Bone density results: OSTEOPOROSIS. Repeat every 2 years.  Lung Cancer Screening: (Low Dose CT Chest recommended if Age 41-80 years, 20 pack-year currently smoking OR have quit w/in 15years.) does not qualify.   Lung Cancer Screening Referral: n/a  Additional Screening:  Hepatitis C Screening: does qualify; placed order 06/01/23  Vision Screening: Recommended annual ophthalmology exams for early detection of  glaucoma and other disorders of the eye.  Dental Screening: Recommended annual dental exams for proper oral hygiene   Community Resource Referral / Chronic Care Management: CRR required this visit?  No   CCM required this visit?  No     Plan:     I have personally reviewed and noted the following in the patient's chart:   Medical and social history Use of alcohol, tobacco or illicit drugs  Current medications and supplements including opioid prescriptions. Patient is not currently taking opioid prescriptions. Functional ability and status Nutritional status Physical activity Advanced directives List of other physicians Hospitalizations, surgeries, and ER visits in previous 12 months Vitals Screenings to include cognitive, depression, and falls Referrals and appointments  In addition, I have reviewed and discussed with patient certain preventive protocols, quality metrics, and best practice recommendations. A written personalized care plan for preventive services as well as general preventive health recommendations were provided to patient.     Tora Kindred, CMA   06/01/2023   After Visit Summary: (MyChart) Due to this being a telephonic visit, the after visit summary with patients personalized plan was offered to patient via MyChart   Nurse Notes:  6 CIT Score - 2 Patient declined covid and shingles vaccines. Patient declined a colonoscopy, but is willing to do cologuard. I placed an order. Placed an order for a MMG Placed an order for Hep C Screening to be done with her next labwork.

## 2023-07-06 ENCOUNTER — Ambulatory Visit (INDEPENDENT_AMBULATORY_CARE_PROVIDER_SITE_OTHER): Payer: Medicare Other | Admitting: Family Medicine

## 2023-07-06 ENCOUNTER — Encounter: Payer: Self-pay | Admitting: Family Medicine

## 2023-07-06 VITALS — BP 110/74 | HR 97 | Ht 62.0 in | Wt 183.4 lb

## 2023-07-06 DIAGNOSIS — M25562 Pain in left knee: Secondary | ICD-10-CM | POA: Diagnosis not present

## 2023-07-06 DIAGNOSIS — G8929 Other chronic pain: Secondary | ICD-10-CM | POA: Diagnosis not present

## 2023-07-06 MED ORDER — MELOXICAM 15 MG PO TABS: 15.0000 mg | ORAL_TABLET | Freq: Every day | ORAL | 0 refills | Status: DC

## 2023-07-06 NOTE — Progress Notes (Signed)
Primary Care / Sports Medicine Office Visit  Patient Information:  Patient ID: Barbara Durham, female DOB: 05-25-1961 Age: 62 y.o. MRN: [id number]   Barbara Durham is a pleasant 62 y.o. female presenting with the following:  Chief Complaint  Patient presents with   Knee Pain    Left knee pain since 06/12/23 when she twisted it. Patient has tried heat and rest but has not helped. She hs also taken Aleve, but her knee is still bothering her. She is having trouble walking , getting in and out of car , going up the stairs, and going from sitting to standing. She is having trouble sleeping as well do to the pain.     Vitals:   07/06/23 1024  BP: 110/74  Pulse: 97  SpO2: 97%   Vitals:   07/06/23 1024  Weight: 183 lb 6.4 oz (83.2 kg)  Height: 5\' 2"  (1.575 m)   Body mass index is 33.54 kg/m.  No results found.   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 She reports that the pain began after she was helping her brother with firewood involving repetitive torsion activity and twisted her knee. The pain is localized to the anterolateral part of the knee and has been causing her significant discomfort and functional impairment. She has difficulty walking, climbing stairs, and getting in and out of her car due to the pain. She also reports that her knee cracks a lot, which is a symptom she has had for some time. The patient has been managing the pain with over-the-counter medications like Aleve and ibuprofen, but these have not provided significant relief. She has not noticed any  swelling in the knee.  Denies any mechanical catching/locking, no buckling/near buckling.  Physical Exam MUSCULOSKELETAL: No abnormalities to gross inspection, left knee with trace effusion, mild tenderness at patellar tendon and quadriceps tendon, mild tenderness at medial and lateral joint line, more tender lateral joint line, full painless range of motion from 0 to 135 degrees, negative anterior and posterior drawer test, negative varus and valgus stressing, equivocal McMurray test localizing laterally  Assessment & Plan Left Knee Pain-acute on chronic with a concern for osteoarthritis related arthralgia, cannot exclude possibility of lateral meniscal derangement Pain since November 18th after a twisting injury. Pain localized to the anterolateral aspect of the knee. Pain with weight-bearing, stairs, and getting in/out of the car. Mild tenderness over patellar and quadriceps tendons, and medial and lateral joint lines. Possible meniscal injury and/or exacerbation of underlying arthritis. -Start Meloxicam, daily for two weeks, then as needed. Take with food. -Order online hinge knee brace for support (information printed and provided to the patient today) and to limit twisting movements. Use for at least two weeks, then as needed. -Check in via MyChart or phone call in 2-4 weeks to assess progress. Consider cortisone injection if no improvement.      Relevant Medications   meloxicam (MOBIC) 15 MG tablet     Orders & Medications Medications:  Meds ordered this encounter  Medications   meloxicam (MOBIC) 15 MG tablet  Sig: Take 1 tablet (15 mg total) by mouth daily. X 2 weeks then daily as-needed (take with food)    Dispense:  30 tablet    Refill:  0   No orders of the defined types were placed in this encounter.    Return if symptoms worsen or fail to improve.     Jerrol Banana, MD, [facility]   Primary Care Sports Medicine Primary Care and Sports Medicine at [facility]

## 2023-07-06 NOTE — Patient Instructions (Addendum)
Patient Summary  Instructions:  - Take Meloxicam daily for two weeks with food, then daily as needed. - Use knee brace for at least two weeks, then as needed. - Contact us in 2-4 weeks via MyChart or phone if not improving. Consider cortisone injection if knee pain persists.

## 2023-07-06 NOTE — Assessment & Plan Note (Signed)
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 She reports that the pain began after she was helping her brother with firewood involving repetitive torsion activity and twisted her knee. The pain is localized to the anterolateral part of the knee and has been causing her significant discomfort and functional impairment. She has difficulty walking, climbing stairs, and getting in and out of her car due to the pain. She also reports that her knee cracks a lot, which is a symptom she has had for some time. The patient has been managing the pain with over-the-counter medications like Aleve and ibuprofen, but these have not provided significant relief. She has not noticed any swelling in the knee.  Denies any mechanical catching/locking, no buckling/near buckling.  Physical Exam MUSCULOSKELETAL: No abnormalities to gross inspection, left knee with trace effusion, mild tenderness at patellar tendon and quadriceps tendon, mild tenderness at medial and lateral joint line, more tender lateral joint line, full painless range of motion from 0 to 135 degrees, negative anterior and posterior drawer test, negative varus and valgus stressing, equivocal McMurray test localizing laterally  Assessment & Plan Left Knee Pain-acute on chronic with a concern for osteoarthritis related arthralgia, cannot exclude possibility of lateral meniscal derangement Pain since November 18th after a twisting injury. Pain localized to the anterolateral aspect of the knee. Pain with weight-bearing, stairs, and getting in/out of the car. Mild tenderness over patellar and quadriceps tendons, and medial and lateral joint lines. Possible meniscal injury and/or exacerbation of underlying arthritis. -Start Meloxicam, daily for two weeks, then as needed. Take with food. -Order online hinge knee brace for support (information  printed and provided to the patient today) and to limit twisting movements. Use for at least two weeks, then as needed. -Check in via MyChart or phone call in 2-4 weeks to assess progress. Consider cortisone injection if no improvement.

## 2023-11-09 ENCOUNTER — Ambulatory Visit
Admission: RE | Admit: 2023-11-09 | Discharge: 2023-11-09 | Disposition: A | Discharge status: Home / Self Care | Source: Ambulatory Visit | Attending: Family Medicine | Admitting: Family Medicine

## 2023-11-09 DIAGNOSIS — Z1231 Encounter for screening mammogram for malignant neoplasm of breast: Secondary | ICD-10-CM | POA: Insufficient documentation

## 2023-12-21 ENCOUNTER — Telehealth: Admitting: Physician Assistant

## 2023-12-21 DIAGNOSIS — H60392 Other infective otitis externa, left ear: Secondary | ICD-10-CM

## 2023-12-21 MED ORDER — NEOMYCIN-POLYMYXIN-HC 3.5-10000-1 OT SUSP: 3.0000 [drp] | Freq: Four times a day (QID) | OTIC | 0 refills | Status: AC

## 2023-12-21 MED ORDER — CIPROFLOXACIN-DEXAMETHASONE 0.3-0.1 % OT SUSP: 4.0000 [drp] | Freq: Two times a day (BID) | OTIC | 0 refills | Status: DC

## 2023-12-21 NOTE — Progress Notes (Signed)
 E Visit for Ear Pain - Otitis Externa  We are sorry that you are not feeling well. Here is how we plan to help!  Based on what you have shared with me it looks like you have Otitis Externa.  Otitis Externa is a redness or swelling, irritation, or infection of your outer ear canal. These symptoms usually occur within a few days of swimming or getting water in your ear, like from the shower. Your ear canal is a tube that goes from the opening of the ear to the eardrum.  When water stays in your ear canal, germs can grow.  It is not contagious and oral antibiotics are not required to treat uncomplicated swimmer's ear.  The usual symptoms include:    Itchiness inside the ear  Redness or a sense of swelling in the ear  Pain when the ear is tugged on when pressure is placed on the ear  Pus draining from the infected ear   I have prescribed: Ciprofloxacin 0.3% and dexamethasone  0.1% otic suspension four drops in affected ears two times a day for 7 days  In certain cases, swimmer's ear may progress to a more serious bacterial infection of the middle or inner ear.  If you have a fever 102 and up and significantly worsening symptoms, this could indicate a more serious infection moving to the middle/inner and needs face to face evaluation in an office by a provider.  Your symptoms should improve over the next 3 days and should resolve in about 7 days.  Be sure to complete ALL of your prescription.  HOME CARE: Wash your hands frequently. If you are prescribed an ear drop, do not place the tip of the bottle on your ear or touch it with your fingers. You can take Acetaminophen  650 mg every 4-6 hours as needed for pain.  If pain is severe or moderate, you can apply a heating pad (set on low) or hot water bottle (wrapped in a towel) to outer ear for 20 minutes.  This will also increase drainage. Avoid ear plugs Do not go swimming until the symptoms are gone Do not use Q-tips After showers, help the water  run out by tilting your head to one side.   GET HELP RIGHT AWAY IF: Fever is over 102.2 degrees. You develop progressive ear pain or hearing loss. Ear symptoms persist longer than 3 days after treatment.  MAKE SURE YOU: Understand these instructions. Will watch your condition. Will get help right away if you are not doing well or get worse.  TO PREVENT SWIMMER'S EAR: Use a bathing cap or custom fitted swim molds to keep your ears dry. Towel off after swimming to dry your ears. Tilt your head or pull your earlobes to allow the water to escape your ear canal. If there is still water in your ears, consider using a hairdryer on the lowest setting.  Thank you for choosing an e-visit.  Your e-visit answers were reviewed by a board certified advanced clinical practitioner to complete your personal care plan. Depending upon the condition, your plan could have included both over the counter or prescription medications.  Please review your pharmacy choice. Make sure the pharmacy is open so you can pick up the prescription now. If there is a problem, you may contact your provider through [employer] and have the prescription routed to another pharmacy.  Your safety is important to us . If you have drug allergies check your prescription carefully.   For the next 24  hours you can use MyChart to ask questions about today's visit, request a non-urgent call back, or ask for a work or school excuse. You will get an email with a survey after your eVisit asking about your experience. We would appreciate your feedback. I hope that your e-visit has been valuable and will aid in your recovery.    I have spent 5 minutes in review of e-visit questionnaire, review and updating patient chart, medical decision making and response to patient.   Angelia Kelp, PA-C

## 2023-12-21 NOTE — Addendum Note (Signed)
 Addended by: Angelia Kelp on: 12/21/2023 06:07 PM   Modules accepted: Orders

## 2024-01-18 ENCOUNTER — Telehealth: Payer: Self-pay | Admitting: Family Medicine

## 2024-01-18 DIAGNOSIS — I10 Essential (primary) hypertension: Secondary | ICD-10-CM

## 2024-01-18 NOTE — Telephone Encounter (Signed)
 Copied from CRM [id number]. Topic: Clinical - Medication Refill >> Jan 18, 2024  9:34 AM Silvana PARAS wrote: Medication: losartan -hydrochlorothiazide (HYZAAR) 100-25 MG tablet  Has the patient contacted their pharmacy? Yes (Agent: If no, request that the patient contact the pharmacy for the refill. If patient does not wish to contact the pharmacy document the reason why and proceed with request.) (Agent: If yes, when and what did the pharmacy advise?)  This is the patient's preferred pharmacy:  [facility] [address] - [place], [place] - [address] [facility] [address] [facility] [address] [place] [address] Phone: [phone] Fax: [phone]  Is this the correct pharmacy for this prescription? Yes If no, delete pharmacy and type the correct one.   Has the prescription been filled recently? No  Is the patient out of the medication? No  Has the patient been seen for an appointment in the last year OR does the patient have an upcoming appointment? Yes  Can we respond through MyChart? Yes  Agent: Please be advised that Rx refills may take up to 3 business days. We ask that you follow-up with your pharmacy.

## 2024-01-19 NOTE — Telephone Encounter (Signed)
 Please review medication refill request.  Patient is scheduled to see you for a transition of care next week.

## 2024-01-19 NOTE — Telephone Encounter (Signed)
 Requested medication (s) are due for refill today: yes  Requested medication (s) are on the active medication list: yes  Last refill:  04/30/24  Future visit scheduled: no  Notes to clinic: provider no longer at this practice, routing for review.     Requested Prescriptions  Pending Prescriptions Disp Refills   losartan -hydrochlorothiazide (HYZAAR) 100-25 MG tablet 90 tablet 1    Sig: Take 1 tablet by mouth daily.     Cardiovascular: ARB + Diuretic Combos Failed - 01/19/2024 12:13 PM      Failed - K in normal range and within 180 days    Potassium  Date Value Ref Range Status  05/01/2023 4.5 3.5 - 5.2 mmol/L Final  10/29/2014 2.7 (L) mmol/L Final    Comment:    3.5-5.1 NOTE: New Reference Range  09/30/14          Failed - Na in normal range and within 180 days    Sodium  Date Value Ref Range Status  05/01/2023 142 134 - 144 mmol/L Final  10/29/2014 135 mmol/L Final    Comment:    135-145 NOTE: New Reference Range  09/30/14          Failed - Cr in normal range and within 180 days    Creatinine  Date Value Ref Range Status  10/29/2014 1.45 (H) mg/dL Final    Comment:    9.55-8.99 NOTE: New Reference Range  09/30/14    Creatinine, Ser  Date Value Ref Range Status  05/01/2023 1.06 (H) 0.57 - 1.00 mg/dL Final         Failed - eGFR is 10 or above and within 180 days    EGFR (African American)  Date Value Ref Range Status  10/29/2014 47 (L)  Final   GFR calc Af Amer  Date Value Ref Range Status  04/07/2020 67 >59 mL/min/1.73 Final    Comment:    **[facility] currently reports eGFR in compliance with the current**   recommendations of the [employer]. [facility] will   update reporting as new guidelines are published from the NKF-ASN   Task force.    EGFR (Non-African Amer.)  Date Value Ref Range Status  10/29/2014 41 (L)  Final    Comment:    eGFR values <31mL/min/1.73 m2 may be an indication of chronic kidney disease (CKD). Calculated  eGFR is useful in patients with stable renal function. The eGFR calculation will not be reliable in acutely ill patients when serum creatinine is changing rapidly. It is not useful in patients on dialysis. The eGFR calculation may not be applicable to patients at the low and high extremes of body sizes, pregnant women, and vegetarians.    GFR calc non Af Amer  Date Value Ref Range Status  04/07/2020 58 (L) >59 mL/min/1.73 Final   eGFR  Date Value Ref Range Status  05/01/2023 59 (L) >59 mL/min/1.73 Final         Failed - Valid encounter within last 6 months    Recent Outpatient Visits   None            Passed - Patient is not pregnant      Passed - Last BP in normal range    BP Readings from Last 1 Encounters:  07/06/23 110/74

## 2024-01-22 ENCOUNTER — Telehealth: Payer: Self-pay

## 2024-01-22 DIAGNOSIS — I10 Essential (primary) hypertension: Secondary | ICD-10-CM

## 2024-01-22 MED ORDER — LOSARTAN POTASSIUM-HCTZ 100-25 MG PO TABS: 1.0000 | ORAL_TABLET | Freq: Every day | ORAL | 0 refills | Status: DC

## 2024-01-22 NOTE — Telephone Encounter (Signed)
 Copied from CRM [id number]. Topic: Clinical - Medication Question >> Jan 22, 2024  8:52 AM Marissa P wrote: Reason for CRM: Patient called in regards to Medication: losartan -hydrochlorothiazide (HYZAAR) 100-25 MG tablet, rx medication refills may take up to 3 business days and to follow up with pharmacy. Confirmed where it was sent to as well. SHE HAS NO MEDS SNOW

## 2024-01-25 ENCOUNTER — Ambulatory Visit (INDEPENDENT_AMBULATORY_CARE_PROVIDER_SITE_OTHER): Admitting: Family Medicine

## 2024-01-25 VITALS — BP 100/76 | HR 80 | Ht 62.0 in | Wt 177.4 lb

## 2024-01-25 DIAGNOSIS — Z6832 Body mass index (BMI) 32.0-32.9, adult: Secondary | ICD-10-CM

## 2024-01-25 DIAGNOSIS — Z130 Encounter for screening for diseases of the blood and blood-forming organs and certain disorders involving the immune mechanism: Secondary | ICD-10-CM

## 2024-01-25 DIAGNOSIS — I10 Essential (primary) hypertension: Secondary | ICD-10-CM | POA: Diagnosis not present

## 2024-01-25 DIAGNOSIS — H6992 Unspecified Eustachian tube disorder, left ear: Secondary | ICD-10-CM | POA: Diagnosis not present

## 2024-01-25 MED ORDER — LOSARTAN POTASSIUM-HCTZ 100-25 MG PO TABS: 1.0000 | ORAL_TABLET | Freq: Every day | ORAL | 1 refills | Status: DC

## 2024-01-25 MED ORDER — FLUTICASONE PROPIONATE 50 MCG/ACT NA SUSP: 2.0000 | Freq: Every day | NASAL | 6 refills | Status: DC

## 2024-01-25 NOTE — Progress Notes (Signed)
 Established Patient Office Visit  Subjective   Patient ID: Barbara Durham, female    DOB: 1961-05-08  Age: 63 y.o. MRN: [id number]  Chief Complaint  Patient presents with   Establish Care    Patient presents today to establish care. Patient is doing well today and does not have any concerns.     Assessment & Plan:   Problem List Items Addressed This Visit       Cardiovascular and Mediastinum   Hypertension   Well-controlled, continue current medications      Relevant Medications   losartan -hydrochlorothiazide (HYZAAR) 100-25 MG tablet   Other Visit Diagnoses       Essential hypertension    -  Primary   Relevant Medications   losartan -hydrochlorothiazide (HYZAAR) 100-25 MG tablet   Other Relevant Orders   Comprehensive metabolic panel with GFR     BMI 32.0-32.9,adult       Relevant Orders   Comprehensive metabolic panel with GFR   TSH     Screening for iron deficiency anemia       Relevant Orders   CBC with Differential/Platelet     Eustachian tube dysfunction, left       Relevant Medications   fluticasone (FLONASE) 50 MCG/ACT nasal spray       Return in about 6 months (around 07/27/2024) for chronic follow up with PCP.   Patient is a 63 y.o. female who presents for follow-up of hypertension. Home blood pressure readings: not doing.  Associated signs and symptoms: none. Patient denies: blurred vision, chest pain, dyspnea, headache, and palpitations. Use of agents associated with hypertension: none. Medication compliance: taking as prescribed.   She also has concerns that her left ear feels clogged.  She had this problem for over 1 month.  Denies any earache, ear discharge, decreased hearing, vertigo sensation.  Reports that she used to go to bed with pet hair which she stopped doing anymore.  All her chronic conditions are at baseline.  Patient is compliant with her medications.  Patient is requesting blood work.  Patient has Cologuard at home, reports  she was unable to send it yet.  Discussed with the patient on how to use Cologuard and ship it.  Also showed a demonstration with you.  Patient is refusing shingles vaccine.      Review of Systems  All other systems reviewed and are negative.     Objective:     BP 100/76   Pulse 80   Ht 5' 2 (1.575 m)   Wt 177 lb 6.4 oz (80.5 kg)   SpO2 97%   BMI 32.45 kg/m    Physical Exam Vitals and nursing note reviewed.  Constitutional:      Appearance: Normal appearance.  HENT:     Head: Normocephalic.     Right Ear: External ear normal.     Left Ear: External ear normal.  Eyes:     Conjunctiva/sclera: Conjunctivae normal.  Cardiovascular:     Rate and Rhythm: Normal rate.  Pulmonary:     Effort: Pulmonary effort is normal. No respiratory distress.  Abdominal:     Palpations: Abdomen is soft.  Musculoskeletal:        General: Normal range of motion.  Skin:    General: Skin is warm.  Neurological:     Mental Status: She is alert and oriented to person, place, and time.  Psychiatric:        Mood and Affect: Mood normal.      No  results found for any visits on 01/25/24.  0  The 10-year ASCVD risk score (Arnett DK, et al., 2019) is: 4%      Vinary K Najee Cowens, MD

## 2024-01-25 NOTE — Assessment & Plan Note (Signed)
 Well-controlled, continue current medications

## 2024-01-26 LAB — CBC WITH DIFFERENTIAL/PLATELET
Basophils Absolute: 0.1 x10E3/uL (ref 0.0–0.2)
Basos: 2 %
EOS (ABSOLUTE): 0.5 x10E3/uL — ABNORMAL HIGH (ref 0.0–0.4)
Eos: 7 %
Hematocrit: 45.2 % (ref 34.0–46.6)
Hemoglobin: 14.9 g/dL (ref 11.1–15.9)
Immature Grans (Abs): 0 x10E3/uL (ref 0.0–0.1)
Immature Granulocytes: 0 %
Lymphocytes Absolute: 1.8 x10E3/uL (ref 0.7–3.1)
Lymphs: 29 %
MCH: 33 pg (ref 26.6–33.0)
MCHC: 33 g/dL (ref 31.5–35.7)
MCV: 100 fL — ABNORMAL HIGH (ref 79–97)
Monocytes Absolute: 0.5 x10E3/uL (ref 0.1–0.9)
Monocytes: 8 %
Neutrophils Absolute: 3.4 x10E3/uL (ref 1.4–7.0)
Neutrophils: 54 %
Platelets: 285 x10E3/uL (ref 150–450)
RBC: 4.51 x10E6/uL (ref 3.77–5.28)
RDW: 11.9 % (ref 11.7–15.4)
WBC: 6.3 x10E3/uL (ref 3.4–10.8)

## 2024-01-26 LAB — COMPREHENSIVE METABOLIC PANEL WITH GFR
ALT: 17 IU/L (ref 0–32)
AST: 17 IU/L (ref 0–40)
Albumin: 3.6 g/dL — ABNORMAL LOW (ref 3.9–4.9)
Alkaline Phosphatase: 51 IU/L (ref 44–121)
BUN/Creatinine Ratio: 17 (ref 12–28)
BUN: 17 mg/dL (ref 8–27)
Bilirubin Total: 0.3 mg/dL (ref 0.0–1.2)
CO2: 25 mmol/L (ref 20–29)
Calcium: 9 mg/dL (ref 8.7–10.3)
Chloride: 104 mmol/L (ref 96–106)
Creatinine, Ser: 1.01 mg/dL — ABNORMAL HIGH (ref 0.57–1.00)
Globulin, Total: 1.8 g/dL (ref 1.5–4.5)
Glucose: 92 mg/dL (ref 70–99)
Potassium: 4.8 mmol/L (ref 3.5–5.2)
Sodium: 142 mmol/L (ref 134–144)
Total Protein: 5.4 g/dL — ABNORMAL LOW (ref 6.0–8.5)
eGFR: 63 mL/min/1.73 (ref >59)

## 2024-01-26 LAB — TSH: TSH: 1.27 u[IU]/mL (ref 0.450–4.500)

## 2024-01-29 ENCOUNTER — Ambulatory Visit: Payer: Self-pay | Admitting: Family Medicine

## 2024-03-20 ENCOUNTER — Telehealth: Admitting: Physician Assistant

## 2024-03-20 DIAGNOSIS — J019 Acute sinusitis, unspecified: Secondary | ICD-10-CM

## 2024-03-20 DIAGNOSIS — B9789 Other viral agents as the cause of diseases classified elsewhere: Secondary | ICD-10-CM | POA: Diagnosis not present

## 2024-03-20 MED ORDER — PREDNISONE 10 MG (21) PO TBPK: ORAL_TABLET | ORAL | 0 refills | Status: DC

## 2024-03-20 NOTE — Progress Notes (Signed)
 E-Visit for Sinus Problems  We are sorry that you are not feeling well.  Here is how we plan to help!  Based on what you have shared with me it looks like you have sinusitis.  Sinusitis is inflammation and infection in the sinus cavities of the head.  Based on your presentation I believe you most likely have Acute Viral Sinusitis.This is an infection most likely caused by a virus. There is not specific treatment for viral sinusitis other than to help you with the symptoms until the infection runs its course.  You may use an oral decongestant such as Mucinex  D or if you have glaucoma or high blood pressure use plain Mucinex . Saline nasal spray help and can safely be used as often as needed for congestion, I have prescribed: a prednisone  tablet pack to take as directed to further reduce sinus inflammation and help speed recovery.  Some authorities believe that zinc sprays or the use of Echinacea may shorten the course of your symptoms.  Sinus infections are not as easily transmitted as other respiratory infection, however we still recommend that you avoid close contact with loved ones, especially the very young and elderly.  Remember to wash your hands thoroughly throughout the day as this is the number one way to prevent the spread of infection!  Home Care: Only take medications as instructed by your medical team. Do not take these medications with alcohol. A steam or ultrasonic humidifier can help congestion.  You can place a towel over your head and breathe in the steam from hot water coming from a faucet. Avoid close contacts especially the very young and the elderly. Cover your mouth when you cough or sneeze. Always remember to wash your hands.  Get Help Right Away If: You develop worsening fever or sinus pain. You develop a severe head ache or visual changes. Your symptoms persist after you have completed your treatment plan.  Make sure you Understand these instructions. Will watch your  condition. Will get help right away if you are not doing well or get worse.   Thank you for choosing an e-visit.  Your e-visit answers were reviewed by a board certified advanced clinical practitioner to complete your personal care plan. Depending upon the condition, your plan could have included both over the counter or prescription medications.  Please review your pharmacy choice. Make sure the pharmacy is open so you can pick up prescription now. If there is a problem, you may contact your provider through [employer] and have the prescription routed to another pharmacy.  Your safety is important to us . If you have drug allergies check your prescription carefully.   For the next 24 hours you can use MyChart to ask questions about today's visit, request a non-urgent call back, or ask for a work or school excuse. You will get an email in the next two days asking about your experience. I hope that your e-visit has been valuable and will speed your recovery.

## 2024-03-20 NOTE — Progress Notes (Signed)
 I have spent 5 minutes in review of e-visit questionnaire, review and updating patient chart, medical decision making and response to patient.   Elsie Velma Lunger, PA-C

## 2024-04-22 ENCOUNTER — Other Ambulatory Visit: Payer: Self-pay

## 2024-04-22 DIAGNOSIS — I10 Essential (primary) hypertension: Secondary | ICD-10-CM

## 2024-04-22 MED ORDER — LOSARTAN POTASSIUM-HCTZ 100-25 MG PO TABS: 1.0000 | ORAL_TABLET | Freq: Every day | ORAL | 1 refills | Status: DC

## 2024-05-15 ENCOUNTER — Other Ambulatory Visit: Payer: Self-pay

## 2024-05-15 DIAGNOSIS — E782 Mixed hyperlipidemia: Secondary | ICD-10-CM

## 2024-05-15 MED ORDER — ATORVASTATIN CALCIUM 10 MG PO TABS: 10.0000 mg | ORAL_TABLET | Freq: Every day | ORAL | 1 refills | Status: AC

## 2024-05-27 ENCOUNTER — Encounter: Payer: Self-pay | Admitting: Pharmacist

## 2024-05-27 NOTE — Progress Notes (Signed)
   05/27/2024  Patient ID: Barbara Durham, female   DOB: [birth year], 63 y.o.   MRN: [id number]  This patient is appearing on a report for being at risk of failing the adherence measure for cholesterol (statin) medications this calendar year.   Medication: atorvastatin  10 mg  Last fill date: 05/15/2024 for 90 day supply  Insurance report was not up to date. No action needed at this time.   Sharyle Sia, PharmD, [facility] [phone]

## 2024-06-06 ENCOUNTER — Ambulatory Visit: Payer: Medicare Other

## 2024-06-10 ENCOUNTER — Ambulatory Visit (INDEPENDENT_AMBULATORY_CARE_PROVIDER_SITE_OTHER): Admitting: Family Medicine

## 2024-06-10 VITALS — BP 114/72 | HR 81 | Ht 62.0 in | Wt 177.0 lb

## 2024-06-10 DIAGNOSIS — M7989 Other specified soft tissue disorders: Secondary | ICD-10-CM | POA: Diagnosis not present

## 2024-06-10 NOTE — Progress Notes (Signed)
 Established Patient Office Visit  Patient ID: Barbara Durham, female    DOB: [birth year]  Age: 63 y.o. MRN: [id number] PCP: Kassidi Elza K, MD  Chief Complaint  Patient presents with   Arm Pain    Recently noticed a lump on her right bicep, has been dealing with weakness in her right arm for a couple of months     Subjective:     HPI  Discussed the use of AI scribe software for clinical note transcription with the patient, who gave verbal consent to proceed.  History of Present Illness Barbara Durham is a 63 year old female who presents with right arm swelling and weakness.  She has experienced swelling in her right arm for more than six months, with a noticeable bulge developing in the last couple of weeks. She reports frequently throwing a ball for her dog and lifting items such as cases of water, but is unsure if these activities caused her symptoms. She experiences cramps in her arm during tasks like opening cans, necessitating rest before she can continue.  There is a noticeable decrease in strength in her right arm compared to her left, affecting her ability to throw a ball as she used to. While there is no significant pain, she describes discomfort and a sensation of the muscle not relaxing properly. No popping sensation or bruising is noted.  Her current medications include Tylenol  arthritis strength and BC powder for pain management. She previously took Aleve but discontinued it due to kidney concerns. She also takes losartan  and a statin for other medical conditions.  She recalls a previous diagnosis of biceps tendinitis in her right shoulder, for which she received an injection to alleviate neck pain.     Review of Systems  All other systems reviewed and are negative.     Objective:     BP 114/72   Pulse 81   Ht 5' 2 (1.575 m)   SpO2 98%   BMI 32.45 kg/m     Physical Exam Vitals and nursing note reviewed.  Constitutional:      Appearance:  Normal appearance.  HENT:     Head: Normocephalic.     Right Ear: External ear normal.     Left Ear: External ear normal.  Eyes:     Conjunctiva/sclera: Conjunctivae normal.  Cardiovascular:     Rate and Rhythm: Normal rate.  Pulmonary:     Effort: Pulmonary effort is normal. No respiratory distress.  Abdominal:     Palpations: Abdomen is soft.  Musculoskeletal:        General: Normal range of motion.       Arms:     Comments: Right arm bulge on flexion , negative for tenderness, bruising. ROM and muscle strength intact.   Skin:    General: Skin is warm.  Neurological:     Mental Status: She is alert and oriented to person, place, and time.  Psychiatric:        Mood and Affect: Mood normal.     Physical Exam MUSCULOSKELETAL: Swelling in right biceps muscle. Right biceps tendon relaxed. SKIN: No bruising on right arm.   No results found for any visits on 06/10/24.     The 10-year ASCVD risk score (Arnett DK, et al., 2019) is: 5.7%    Assessment & Plan:   Problem List Items Addressed This Visit   None   Assessment and Plan Assessment & Plan Right arm soft tissue swelling and possible biceps tendon injury  Chronic swelling with recent bulge, likely due to overuse. Differential includes partial biceps tendon tear or inflammation. No pain, decreased strength, occasional discomfort. No bruising or discoloration. Previous biceps tendinitis noted. - Ordered ultrasound of right arm to assess for tendon injury. - Advised rest and icing of the affected area. - Recommended Tylenol  for inflammation and discomfort. - Will discuss potential referral to general surgery if ultrasound indicates tendon injury.  Essential hypertension Hypertension managed with Losartan -hydrochlorothiazide. - Continue current antihypertensive medication regimen.    No follow-ups on file.    Barbara K Sylvestre Rathgeber, MD [facility]

## 2024-06-12 ENCOUNTER — Ambulatory Visit
Admission: RE | Admit: 2024-06-12 | Discharge: 2024-06-12 | Disposition: A | Discharge status: Home / Self Care | Source: Ambulatory Visit | Attending: Family Medicine | Admitting: Family Medicine

## 2024-06-12 DIAGNOSIS — M7989 Other specified soft tissue disorders: Secondary | ICD-10-CM | POA: Insufficient documentation

## 2024-06-19 ENCOUNTER — Other Ambulatory Visit: Payer: Self-pay | Admitting: Family Medicine

## 2024-06-19 ENCOUNTER — Ambulatory Visit: Payer: Self-pay | Admitting: Family Medicine

## 2024-06-19 DIAGNOSIS — R9389 Abnormal findings on diagnostic imaging of other specified body structures: Secondary | ICD-10-CM

## 2024-06-19 DIAGNOSIS — M7989 Other specified soft tissue disorders: Secondary | ICD-10-CM

## 2024-07-03 ENCOUNTER — Ambulatory Visit: Admission: RE | Admit: 2024-07-03 | Discharge: 2024-07-03 | Attending: Family Medicine | Admitting: Family Medicine

## 2024-07-03 DIAGNOSIS — R9389 Abnormal findings on diagnostic imaging of other specified body structures: Secondary | ICD-10-CM | POA: Diagnosis present

## 2024-07-03 DIAGNOSIS — M7989 Other specified soft tissue disorders: Secondary | ICD-10-CM | POA: Diagnosis present

## 2024-07-03 MED ORDER — GADOBUTROL 1 MMOL/ML IV SOLN
7.5000 mL | Freq: Once | INTRAVENOUS | Status: AC | PRN
  Administered 2024-07-03: 7.5 mL via INTRAVENOUS

## 2024-07-04 ENCOUNTER — Ambulatory Visit: Payer: Self-pay | Admitting: Family Medicine

## 2024-07-05 ENCOUNTER — Telehealth: Payer: Self-pay | Admitting: Family Medicine

## 2024-07-05 NOTE — Telephone Encounter (Signed)
 Not sure if this was meant for me. Please follow up.

## 2024-07-05 NOTE — Telephone Encounter (Unsigned)
 Copied from CRM #[id number]. Topic: Appointments - Appointment Scheduling >> Jul 05, 2024 12:22 PM Franky GRADE wrote: Patient/patient representative is calling to schedule an appointment. Refer to attachments for appointment information.  Patient is calling because her brother is a patient of Dr.Duncan and was referred to establish care with him as her previous primary care provider has retired. She understands he is not taking on new patients but would like to know if an exception could be made.

## 2024-07-05 NOTE — Telephone Encounter (Signed)
Please set up when possible.  Thanks.

## 2024-07-08 NOTE — Telephone Encounter (Signed)
 Called and schedule pt for new pt

## 2024-08-01 ENCOUNTER — Ambulatory Visit: Admitting: Family Medicine

## 2024-08-27 ENCOUNTER — Encounter: Payer: Self-pay | Admitting: Family Medicine

## 2024-08-27 ENCOUNTER — Ambulatory Visit
Admission: RE | Admit: 2024-08-27 | Discharge: 2024-08-27 | Disposition: A | Source: Ambulatory Visit | Attending: Family Medicine | Admitting: Family Medicine

## 2024-08-27 ENCOUNTER — Ambulatory Visit: Admitting: Family Medicine

## 2024-08-27 VITALS — BP 112/72 | HR 87 | Temp 97.8°F | Ht 61.5 in | Wt 177.2 lb

## 2024-08-27 DIAGNOSIS — Z7189 Other specified counseling: Secondary | ICD-10-CM

## 2024-08-27 DIAGNOSIS — R238 Other skin changes: Secondary | ICD-10-CM

## 2024-08-27 DIAGNOSIS — M25532 Pain in left wrist: Secondary | ICD-10-CM | POA: Diagnosis not present

## 2024-08-27 DIAGNOSIS — H6992 Unspecified Eustachian tube disorder, left ear: Secondary | ICD-10-CM

## 2024-08-27 DIAGNOSIS — I10 Essential (primary) hypertension: Secondary | ICD-10-CM

## 2024-08-27 DIAGNOSIS — F419 Anxiety disorder, unspecified: Secondary | ICD-10-CM

## 2024-08-27 DIAGNOSIS — Z0000 Encounter for general adult medical examination without abnormal findings: Secondary | ICD-10-CM

## 2024-08-27 DIAGNOSIS — Z862 Personal history of diseases of the blood and blood-forming organs and certain disorders involving the immune mechanism: Secondary | ICD-10-CM

## 2024-08-27 MED ORDER — LOSARTAN POTASSIUM-HCTZ 100-25 MG PO TABS: 1.0000 | ORAL_TABLET | Freq: Every day | ORAL | 2 refills | Status: AC

## 2024-08-27 MED ORDER — TRIAMCINOLONE ACETONIDE 0.1 % EX CREA: 1.0000 | TOPICAL_CREAM | Freq: Two times a day (BID) | CUTANEOUS | 3 refills | Status: AC | PRN

## 2024-08-27 MED ORDER — DICLOFENAC SODIUM 1 % EX GEL: 2.0000 g | Freq: Four times a day (QID) | CUTANEOUS | 5 refills | Status: AC | PRN

## 2024-08-27 MED ORDER — FLUTICASONE PROPIONATE 50 MCG/ACT NA SUSP: 2.0000 | Freq: Every day | NASAL | Status: AC

## 2024-08-29 ENCOUNTER — Encounter: Payer: Self-pay | Admitting: Family Medicine

## 2024-08-29 DIAGNOSIS — Z0000 Encounter for general adult medical examination without abnormal findings: Secondary | ICD-10-CM | POA: Insufficient documentation

## 2024-08-29 DIAGNOSIS — Z7189 Other specified counseling: Secondary | ICD-10-CM | POA: Insufficient documentation

## 2024-08-29 DIAGNOSIS — M25532 Pain in left wrist: Secondary | ICD-10-CM | POA: Insufficient documentation

## 2024-08-29 DIAGNOSIS — R238 Other skin changes: Secondary | ICD-10-CM | POA: Insufficient documentation

## 2024-08-29 DIAGNOSIS — Z862 Personal history of diseases of the blood and blood-forming organs and certain disorders involving the immune mechanism: Secondary | ICD-10-CM | POA: Insufficient documentation

## 2024-08-29 NOTE — Assessment & Plan Note (Signed)
" °  D/w pt about TAC cream use.  She can update me as needed. "

## 2024-08-29 NOTE — Assessment & Plan Note (Signed)
 See notes on imaging.  I am going to check on sports medicine follow-up here.

## 2024-08-29 NOTE — Assessment & Plan Note (Signed)
 See notes on labs.

## 2024-08-29 NOTE — Assessment & Plan Note (Signed)
" °  Son Barbara Durham designated if patient were incapacitated.   "

## 2024-08-29 NOTE — Assessment & Plan Note (Signed)
 Continue losartan  hydrochlorothiazide.  I am going to check on her old records regarding statin use.  No clear ADE on statin previously.

## 2024-08-29 NOTE — Assessment & Plan Note (Signed)
 H/o mood changes after sig work stressors.  She is managing as is, using breathing exercises for anxiety.  She stopped work on disability.   Okay for outpatient follow-up.

## 2024-08-29 NOTE — Assessment & Plan Note (Signed)
 Defer health maintenance today given the above.   Son Chad Sykes designated if patient were incapacitated.   Colonoscopy >10 years ago.  She has cologuard kit to complete 2026.   Vaccines d/w pt.  Pap not due.

## 2024-09-04 ENCOUNTER — Other Ambulatory Visit
